# Patient Record
Sex: Female | Born: 1940 | Race: Black or African American | Hispanic: No | Smoking: Never smoker
Health system: Southern US, Community
[De-identification: ages and names within clinical notes are randomized; demographics above are authoritative.]

## PROBLEM LIST (undated history)

## (undated) ENCOUNTER — Emergency Department (HOSPITAL_COMMUNITY): Admission: EM | Discharge status: Against Medical Advice | Payer: Medicare Other

## (undated) DIAGNOSIS — N6009 Solitary cyst of unspecified breast: Secondary | ICD-10-CM

## (undated) DIAGNOSIS — Z972 Presence of dental prosthetic device (complete) (partial): Secondary | ICD-10-CM

## (undated) DIAGNOSIS — I2699 Other pulmonary embolism without acute cor pulmonale: Secondary | ICD-10-CM

## (undated) DIAGNOSIS — I272 Pulmonary hypertension, unspecified: Secondary | ICD-10-CM

## (undated) DIAGNOSIS — D472 Monoclonal gammopathy: Secondary | ICD-10-CM

## (undated) DIAGNOSIS — M1990 Unspecified osteoarthritis, unspecified site: Secondary | ICD-10-CM

## (undated) DIAGNOSIS — E039 Hypothyroidism, unspecified: Secondary | ICD-10-CM

## (undated) DIAGNOSIS — R0600 Dyspnea, unspecified: Secondary | ICD-10-CM

## (undated) DIAGNOSIS — N281 Cyst of kidney, acquired: Secondary | ICD-10-CM

## (undated) DIAGNOSIS — F0390 Unspecified dementia without behavioral disturbance: Secondary | ICD-10-CM

## (undated) DIAGNOSIS — D72819 Decreased white blood cell count, unspecified: Secondary | ICD-10-CM

## (undated) HISTORY — DX: Monoclonal gammopathy: D47.2

## (undated) HISTORY — DX: Other pulmonary embolism without acute cor pulmonale: I26.99

## (undated) HISTORY — DX: Unspecified osteoarthritis, unspecified site: M19.90

## (undated) HISTORY — DX: Presence of dental prosthetic device (complete) (partial): Z97.2

## (undated) HISTORY — DX: Dyspnea, unspecified: R06.00

## (undated) HISTORY — DX: Solitary cyst of unspecified breast: N60.09

## (undated) HISTORY — PX: TUBAL LIGATION: SHX77

## (undated) HISTORY — DX: Cyst of kidney, acquired: N28.1

## (undated) HISTORY — PX: BREAST SURGERY: SHX581

## (undated) HISTORY — DX: Pulmonary hypertension, unspecified: I27.20

## (undated) HISTORY — DX: Decreased white blood cell count, unspecified: D72.819

## (undated) HISTORY — DX: Hypothyroidism, unspecified: E03.9

---

## 1968-08-31 HISTORY — PX: TUBAL LIGATION: SHX77

## 1998-04-22 ENCOUNTER — Ambulatory Visit (HOSPITAL_COMMUNITY): Admission: RE | Admit: 1998-04-22 | Discharge: 1998-04-22 | Payer: Self-pay | Admitting: Gastroenterology

## 1998-07-04 ENCOUNTER — Ambulatory Visit (HOSPITAL_COMMUNITY): Admission: RE | Admit: 1998-07-04 | Discharge: 1998-07-04 | Payer: Self-pay | Admitting: Family Medicine

## 1998-07-04 ENCOUNTER — Encounter: Payer: Self-pay | Admitting: Family Medicine

## 1999-04-08 ENCOUNTER — Emergency Department (HOSPITAL_COMMUNITY): Admission: EM | Admit: 1999-04-08 | Discharge: 1999-04-08 | Payer: Self-pay

## 1999-09-04 ENCOUNTER — Ambulatory Visit (HOSPITAL_COMMUNITY): Admission: RE | Admit: 1999-09-04 | Discharge: 1999-09-04 | Payer: Self-pay | Admitting: Family Medicine

## 1999-09-04 ENCOUNTER — Encounter: Payer: Self-pay | Admitting: Family Medicine

## 1999-12-11 ENCOUNTER — Inpatient Hospital Stay (HOSPITAL_COMMUNITY): Admission: AD | Admit: 1999-12-11 | Discharge: 1999-12-11 | Payer: Self-pay | Admitting: Obstetrics

## 2000-12-28 ENCOUNTER — Ambulatory Visit (HOSPITAL_COMMUNITY): Admission: RE | Admit: 2000-12-28 | Discharge: 2000-12-28 | Payer: Self-pay | Admitting: Family Medicine

## 2000-12-28 ENCOUNTER — Encounter: Payer: Self-pay | Admitting: Family Medicine

## 2000-12-31 ENCOUNTER — Encounter: Payer: Self-pay | Admitting: Family Medicine

## 2000-12-31 ENCOUNTER — Encounter: Admission: RE | Admit: 2000-12-31 | Discharge: 2000-12-31 | Payer: Self-pay | Admitting: Family Medicine

## 2001-07-05 ENCOUNTER — Encounter: Admission: RE | Admit: 2001-07-05 | Discharge: 2001-07-05 | Payer: Self-pay | Admitting: Family Medicine

## 2001-07-05 ENCOUNTER — Encounter: Payer: Self-pay | Admitting: Family Medicine

## 2002-01-17 ENCOUNTER — Encounter: Payer: Self-pay | Admitting: Family Medicine

## 2002-01-17 ENCOUNTER — Encounter: Admission: RE | Admit: 2002-01-17 | Discharge: 2002-01-17 | Payer: Self-pay | Admitting: Family Medicine

## 2002-11-21 ENCOUNTER — Other Ambulatory Visit: Admission: RE | Admit: 2002-11-21 | Discharge: 2002-11-21 | Payer: Self-pay | Admitting: Family Medicine

## 2003-01-23 ENCOUNTER — Encounter: Payer: Self-pay | Admitting: Family Medicine

## 2003-01-23 ENCOUNTER — Encounter: Admission: RE | Admit: 2003-01-23 | Discharge: 2003-01-23 | Payer: Self-pay | Admitting: Family Medicine

## 2003-03-29 ENCOUNTER — Encounter (HOSPITAL_COMMUNITY): Payer: Self-pay | Admitting: Oncology

## 2003-03-29 ENCOUNTER — Ambulatory Visit (HOSPITAL_COMMUNITY): Admission: RE | Admit: 2003-03-29 | Discharge: 2003-03-29 | Payer: Self-pay | Admitting: Oncology

## 2003-04-18 ENCOUNTER — Encounter (INDEPENDENT_AMBULATORY_CARE_PROVIDER_SITE_OTHER): Payer: Self-pay | Admitting: Specialist

## 2003-04-18 ENCOUNTER — Ambulatory Visit (HOSPITAL_COMMUNITY): Admission: RE | Admit: 2003-04-18 | Discharge: 2003-04-18 | Payer: Self-pay | Admitting: Gastroenterology

## 2003-12-18 ENCOUNTER — Other Ambulatory Visit: Admission: RE | Admit: 2003-12-18 | Discharge: 2003-12-18 | Payer: Self-pay | Admitting: Family Medicine

## 2004-02-04 ENCOUNTER — Encounter: Admission: RE | Admit: 2004-02-04 | Discharge: 2004-02-04 | Payer: Self-pay | Admitting: Family Medicine

## 2004-02-06 ENCOUNTER — Encounter: Admission: RE | Admit: 2004-02-06 | Discharge: 2004-02-06 | Payer: Self-pay | Admitting: Family Medicine

## 2004-07-04 ENCOUNTER — Ambulatory Visit: Payer: Self-pay | Admitting: Oncology

## 2004-10-06 ENCOUNTER — Ambulatory Visit: Payer: Self-pay | Admitting: Oncology

## 2005-01-08 ENCOUNTER — Other Ambulatory Visit: Admission: RE | Admit: 2005-01-08 | Discharge: 2005-01-08 | Payer: Self-pay | Admitting: Family Medicine

## 2005-01-13 ENCOUNTER — Encounter: Admission: RE | Admit: 2005-01-13 | Discharge: 2005-04-13 | Payer: Self-pay | Admitting: Family Medicine

## 2005-02-19 ENCOUNTER — Encounter: Admission: RE | Admit: 2005-02-19 | Discharge: 2005-02-19 | Payer: Self-pay | Admitting: Family Medicine

## 2005-04-16 ENCOUNTER — Ambulatory Visit: Payer: Self-pay | Admitting: Oncology

## 2005-04-27 ENCOUNTER — Ambulatory Visit (HOSPITAL_COMMUNITY): Admission: RE | Admit: 2005-04-27 | Discharge: 2005-04-27 | Payer: Self-pay | Admitting: Oncology

## 2005-10-15 ENCOUNTER — Ambulatory Visit: Payer: Self-pay | Admitting: Oncology

## 2006-01-21 ENCOUNTER — Other Ambulatory Visit: Admission: RE | Admit: 2006-01-21 | Discharge: 2006-01-21 | Payer: Self-pay | Admitting: Family Medicine

## 2006-02-09 ENCOUNTER — Encounter: Admission: RE | Admit: 2006-02-09 | Discharge: 2006-02-09 | Payer: Self-pay

## 2006-03-16 ENCOUNTER — Encounter: Admission: RE | Admit: 2006-03-16 | Discharge: 2006-03-16 | Payer: Self-pay | Admitting: Family Medicine

## 2006-04-01 ENCOUNTER — Encounter: Admission: RE | Admit: 2006-04-01 | Discharge: 2006-04-01 | Payer: Self-pay | Admitting: Family Medicine

## 2006-04-14 ENCOUNTER — Ambulatory Visit: Payer: Self-pay | Admitting: Oncology

## 2006-04-16 LAB — COMPREHENSIVE METABOLIC PANEL
ALT: 12 U/L (ref 0–40)
AST: 13 U/L (ref 0–37)
Alkaline Phosphatase: 35 U/L — ABNORMAL LOW (ref 39–117)
CO2: 27 mEq/L (ref 19–32)
Creatinine, Ser: 0.97 mg/dL (ref 0.40–1.20)
Sodium: 138 mEq/L (ref 135–145)
Total Bilirubin: 0.6 mg/dL (ref 0.3–1.2)
Total Protein: 7.5 g/dL (ref 6.0–8.3)

## 2006-04-16 LAB — CBC WITH DIFFERENTIAL/PLATELET
BASO%: 0.4 % (ref 0.0–2.0)
EOS%: 3.9 % (ref 0.0–7.0)
HCT: 38.2 % (ref 34.8–46.6)
LYMPH%: 41.9 % (ref 14.0–48.0)
MCH: 30.8 pg (ref 26.0–34.0)
MCHC: 33.6 g/dL (ref 32.0–36.0)
MCV: 91.7 fL (ref 81.0–101.0)
MONO%: 8.4 % (ref 0.0–13.0)
NEUT%: 45.4 % (ref 39.6–76.8)
Platelets: 283 10*3/uL (ref 145–400)
RBC: 4.16 10*6/uL (ref 3.70–5.32)
WBC: 3.3 10*3/uL — ABNORMAL LOW (ref 3.9–10.0)

## 2006-04-16 LAB — LACTATE DEHYDROGENASE: LDH: 181 U/L (ref 94–250)

## 2006-04-16 LAB — IGG, IGA, IGM: IgM, Serum: 44 mg/dL — ABNORMAL LOW (ref 60–263)

## 2006-06-30 ENCOUNTER — Emergency Department (HOSPITAL_COMMUNITY): Admission: EM | Admit: 2006-06-30 | Discharge: 2006-06-30 | Payer: Self-pay | Admitting: Family Medicine

## 2006-09-30 ENCOUNTER — Emergency Department (HOSPITAL_COMMUNITY): Admission: EM | Admit: 2006-09-30 | Discharge: 2006-09-30 | Payer: Self-pay | Admitting: Emergency Medicine

## 2006-09-30 ENCOUNTER — Ambulatory Visit: Payer: Self-pay | Admitting: Psychiatry

## 2006-09-30 ENCOUNTER — Inpatient Hospital Stay (HOSPITAL_COMMUNITY): Admission: RE | Admit: 2006-09-30 | Discharge: 2006-10-12 | Payer: Self-pay | Admitting: Psychiatry

## 2006-10-12 ENCOUNTER — Ambulatory Visit: Payer: Self-pay | Admitting: Oncology

## 2007-03-21 ENCOUNTER — Encounter: Admission: RE | Admit: 2007-03-21 | Discharge: 2007-03-21 | Payer: Self-pay | Admitting: Family Medicine

## 2007-07-14 ENCOUNTER — Ambulatory Visit: Payer: Self-pay | Admitting: Oncology

## 2007-07-18 LAB — CBC WITH DIFFERENTIAL/PLATELET
BASO%: 0.4 % (ref 0.0–2.0)
Eosinophils Absolute: 0.1 10*3/uL (ref 0.0–0.5)
HCT: 37.4 % (ref 34.8–46.6)
MCHC: 34.5 g/dL (ref 32.0–36.0)
MONO#: 0.1 10*3/uL (ref 0.1–0.9)
NEUT#: 4.2 10*3/uL (ref 1.5–6.5)
NEUT%: 73.2 % (ref 39.6–76.8)
Platelets: 316 10*3/uL (ref 145–400)
WBC: 5.7 10*3/uL (ref 3.9–10.0)
lymph#: 1.3 10*3/uL (ref 0.9–3.3)

## 2007-07-18 LAB — COMPREHENSIVE METABOLIC PANEL
ALT: 12 U/L (ref 0–35)
AST: 14 U/L (ref 0–37)
Albumin: 3.9 g/dL (ref 3.5–5.2)
Alkaline Phosphatase: 44 U/L (ref 39–117)
Calcium: 9.7 mg/dL (ref 8.4–10.5)
Chloride: 104 mEq/L (ref 96–112)
Potassium: 4.2 mEq/L (ref 3.5–5.3)
Sodium: 138 mEq/L (ref 135–145)

## 2008-01-12 ENCOUNTER — Ambulatory Visit: Payer: Self-pay | Admitting: Oncology

## 2008-02-14 ENCOUNTER — Other Ambulatory Visit: Admission: RE | Admit: 2008-02-14 | Discharge: 2008-02-14 | Payer: Self-pay | Admitting: Family Medicine

## 2008-03-23 ENCOUNTER — Encounter: Admission: RE | Admit: 2008-03-23 | Discharge: 2008-03-23 | Payer: Self-pay | Admitting: Family Medicine

## 2008-07-17 ENCOUNTER — Ambulatory Visit: Payer: Self-pay | Admitting: Oncology

## 2008-07-19 LAB — CBC WITH DIFFERENTIAL/PLATELET
Basophils Absolute: 0 10*3/uL (ref 0.0–0.1)
Eosinophils Absolute: 0.1 10*3/uL (ref 0.0–0.5)
HGB: 12.6 g/dL (ref 11.6–15.9)
MCV: 93.6 fL (ref 81.0–101.0)
MONO%: 6.4 % (ref 0.0–13.0)
NEUT#: 4 10*3/uL (ref 1.5–6.5)
Platelets: 272 10*3/uL (ref 145–400)
RDW: 14.1 % (ref 11.3–14.5)

## 2008-07-20 LAB — COMPREHENSIVE METABOLIC PANEL
Albumin: 3.9 g/dL (ref 3.5–5.2)
Alkaline Phosphatase: 43 U/L (ref 39–117)
BUN: 10 mg/dL (ref 6–23)
CO2: 26 mEq/L (ref 19–32)
Calcium: 10 mg/dL (ref 8.4–10.5)
Glucose, Bld: 116 mg/dL — ABNORMAL HIGH (ref 70–99)
Potassium: 3.9 mEq/L (ref 3.5–5.3)

## 2008-07-20 LAB — IGG, IGA, IGM
IgA: 87 mg/dL (ref 68–378)
IgG (Immunoglobin G), Serum: 2670 mg/dL — ABNORMAL HIGH (ref 694–1618)

## 2008-07-20 LAB — LACTATE DEHYDROGENASE: LDH: 184 U/L (ref 94–250)

## 2009-01-15 ENCOUNTER — Ambulatory Visit: Payer: Self-pay | Admitting: Oncology

## 2009-01-17 LAB — COMPREHENSIVE METABOLIC PANEL
ALT: 10 U/L (ref 0–35)
AST: 12 U/L (ref 0–37)
Albumin: 4.1 g/dL (ref 3.5–5.2)
BUN: 13 mg/dL (ref 6–23)
Calcium: 9.9 mg/dL (ref 8.4–10.5)
Chloride: 106 mEq/L (ref 96–112)
Potassium: 4.1 mEq/L (ref 3.5–5.3)
Sodium: 140 mEq/L (ref 135–145)
Total Protein: 7.9 g/dL (ref 6.0–8.3)

## 2009-01-17 LAB — CBC WITH DIFFERENTIAL/PLATELET
Basophils Absolute: 0 10*3/uL (ref 0.0–0.1)
EOS%: 2.2 % (ref 0.0–7.0)
Eosinophils Absolute: 0.1 10*3/uL (ref 0.0–0.5)
HGB: 12.7 g/dL (ref 11.6–15.9)
MCH: 31.1 pg (ref 25.1–34.0)
NEUT#: 2.1 10*3/uL (ref 1.5–6.5)
RDW: 13.6 % (ref 11.2–14.5)
lymph#: 1.7 10*3/uL (ref 0.9–3.3)

## 2009-01-17 LAB — IGG, IGA, IGM: IgA: 82 mg/dL (ref 68–378)

## 2009-02-20 ENCOUNTER — Other Ambulatory Visit: Admission: RE | Admit: 2009-02-20 | Discharge: 2009-02-20 | Payer: Self-pay | Admitting: Family Medicine

## 2009-03-27 ENCOUNTER — Encounter: Admission: RE | Admit: 2009-03-27 | Discharge: 2009-03-27 | Payer: Self-pay | Admitting: Family Medicine

## 2009-07-11 ENCOUNTER — Ambulatory Visit: Payer: Self-pay | Admitting: Oncology

## 2009-07-15 LAB — COMPREHENSIVE METABOLIC PANEL
AST: 13 U/L (ref 0–37)
Alkaline Phosphatase: 35 U/L — ABNORMAL LOW (ref 39–117)
BUN: 16 mg/dL (ref 6–23)
Creatinine, Ser: 1.27 mg/dL — ABNORMAL HIGH (ref 0.40–1.20)

## 2009-07-15 LAB — CBC WITH DIFFERENTIAL/PLATELET
Eosinophils Absolute: 0.1 10*3/uL (ref 0.0–0.5)
LYMPH%: 36.4 % (ref 14.0–49.7)
MCHC: 33.6 g/dL (ref 31.5–36.0)
MCV: 94.9 fL (ref 79.5–101.0)
MONO%: 5.1 % (ref 0.0–14.0)
NEUT#: 2.4 10*3/uL (ref 1.5–6.5)
Platelets: 272 10*3/uL (ref 145–400)
RBC: 4.19 10*6/uL (ref 3.70–5.45)

## 2009-07-15 LAB — IGG, IGA, IGM
IgA: 88 mg/dL (ref 68–378)
IgM, Serum: 54 mg/dL — ABNORMAL LOW (ref 60–263)

## 2010-01-10 ENCOUNTER — Ambulatory Visit: Payer: Self-pay | Admitting: Oncology

## 2010-01-13 LAB — COMPREHENSIVE METABOLIC PANEL
AST: 12 U/L (ref 0–37)
Albumin: 4.1 g/dL (ref 3.5–5.2)
Alkaline Phosphatase: 33 U/L — ABNORMAL LOW (ref 39–117)
BUN: 15 mg/dL (ref 6–23)
Creatinine, Ser: 1.26 mg/dL — ABNORMAL HIGH (ref 0.40–1.20)
Potassium: 4.3 mEq/L (ref 3.5–5.3)
Sodium: 136 mEq/L (ref 135–145)
Total Protein: 8.2 g/dL (ref 6.0–8.3)

## 2010-01-13 LAB — CBC WITH DIFFERENTIAL/PLATELET
BASO%: 0.7 % (ref 0.0–2.0)
EOS%: 2.9 % (ref 0.0–7.0)
HCT: 38.2 % (ref 34.8–46.6)
LYMPH%: 33.6 % (ref 14.0–49.7)
MCH: 32 pg (ref 25.1–34.0)
MONO#: 0.3 10*3/uL (ref 0.1–0.9)
NEUT%: 57.1 % (ref 38.4–76.8)
Platelets: 259 10*3/uL (ref 145–400)
RDW: 13.8 % (ref 11.2–14.5)

## 2010-01-13 LAB — IGG, IGA, IGM
IgG (Immunoglobin G), Serum: 2940 mg/dL — ABNORMAL HIGH (ref 694–1618)
IgM, Serum: 55 mg/dL — ABNORMAL LOW (ref 60–263)

## 2010-01-17 ENCOUNTER — Ambulatory Visit (HOSPITAL_COMMUNITY): Admission: RE | Admit: 2010-01-17 | Discharge: 2010-01-17 | Payer: Self-pay | Admitting: Oncology

## 2010-04-02 ENCOUNTER — Encounter: Admission: RE | Admit: 2010-04-02 | Discharge: 2010-04-02 | Payer: Self-pay | Admitting: Family Medicine

## 2010-04-08 ENCOUNTER — Encounter: Admission: RE | Admit: 2010-04-08 | Discharge: 2010-04-08 | Payer: Self-pay | Admitting: Family Medicine

## 2010-04-14 ENCOUNTER — Ambulatory Visit: Payer: Self-pay | Admitting: Oncology

## 2010-04-15 ENCOUNTER — Encounter: Admission: RE | Admit: 2010-04-15 | Discharge: 2010-04-15 | Payer: Self-pay | Admitting: Family Medicine

## 2010-04-16 ENCOUNTER — Other Ambulatory Visit (HOSPITAL_COMMUNITY): Payer: Self-pay | Admitting: Oncology

## 2010-04-16 ENCOUNTER — Ambulatory Visit (HOSPITAL_COMMUNITY): Admission: RE | Admit: 2010-04-16 | Discharge: 2010-04-16 | Payer: Self-pay | Admitting: Oncology

## 2010-04-17 LAB — IGG, IGA, IGM
IgG (Immunoglobin G), Serum: 2790 mg/dL — ABNORMAL HIGH (ref 694–1618)
IgM, Serum: 49 mg/dL — ABNORMAL LOW (ref 60–263)

## 2010-06-13 ENCOUNTER — Encounter: Admission: RE | Admit: 2010-06-13 | Discharge: 2010-06-13 | Payer: Self-pay | Admitting: Family Medicine

## 2010-06-17 ENCOUNTER — Encounter: Payer: Self-pay | Admitting: Internal Medicine

## 2010-06-24 ENCOUNTER — Encounter: Admission: RE | Admit: 2010-06-24 | Discharge: 2010-06-24 | Payer: Self-pay | Admitting: Interventional Cardiology

## 2010-06-24 ENCOUNTER — Inpatient Hospital Stay (HOSPITAL_COMMUNITY)
Admission: EM | Admit: 2010-06-24 | Discharge: 2010-06-26 | Payer: Self-pay | Source: Home / Self Care | Admitting: Emergency Medicine

## 2010-08-01 ENCOUNTER — Ambulatory Visit: Payer: Self-pay | Admitting: Oncology

## 2010-08-06 ENCOUNTER — Ambulatory Visit: Admit: 2010-08-06 | Payer: Self-pay | Admitting: Oncology

## 2010-08-15 ENCOUNTER — Ambulatory Visit (HOSPITAL_COMMUNITY)
Admission: RE | Admit: 2010-08-15 | Discharge: 2010-08-15 | Payer: Self-pay | Source: Home / Self Care | Attending: Oncology | Admitting: Oncology

## 2010-08-18 ENCOUNTER — Encounter
Admission: RE | Admit: 2010-08-18 | Discharge: 2010-08-18 | Payer: Self-pay | Source: Home / Self Care | Attending: General Surgery | Admitting: General Surgery

## 2010-08-19 ENCOUNTER — Ambulatory Visit (HOSPITAL_COMMUNITY)
Admission: RE | Admit: 2010-08-19 | Discharge: 2010-08-19 | Payer: Self-pay | Source: Home / Self Care | Attending: Oncology | Admitting: Oncology

## 2010-08-19 ENCOUNTER — Encounter (HOSPITAL_COMMUNITY): Payer: Self-pay | Admitting: Oncology

## 2010-09-21 ENCOUNTER — Encounter: Payer: Self-pay | Admitting: Family Medicine

## 2010-09-22 ENCOUNTER — Encounter: Payer: Self-pay | Admitting: Orthopedic Surgery

## 2010-10-01 ENCOUNTER — Other Ambulatory Visit (HOSPITAL_COMMUNITY)
Admission: RE | Admit: 2010-10-01 | Discharge: 2010-10-01 | Disposition: A | Discharge status: Home / Self Care | Payer: Medicare Other | Source: Ambulatory Visit | Attending: Family Medicine | Admitting: Family Medicine

## 2010-10-01 ENCOUNTER — Other Ambulatory Visit: Payer: Self-pay | Admitting: Family Medicine

## 2010-10-01 DIAGNOSIS — N949 Unspecified condition associated with female genital organs and menstrual cycle: Secondary | ICD-10-CM | POA: Insufficient documentation

## 2010-11-03 ENCOUNTER — Other Ambulatory Visit (HOSPITAL_COMMUNITY): Payer: Self-pay | Admitting: Oncology

## 2010-11-03 ENCOUNTER — Encounter (HOSPITAL_BASED_OUTPATIENT_CLINIC_OR_DEPARTMENT_OTHER): Payer: Medicare Other | Admitting: Oncology

## 2010-11-03 DIAGNOSIS — E039 Hypothyroidism, unspecified: Secondary | ICD-10-CM | POA: Insufficient documentation

## 2010-11-03 DIAGNOSIS — J90 Pleural effusion, not elsewhere classified: Secondary | ICD-10-CM | POA: Insufficient documentation

## 2010-11-03 DIAGNOSIS — M1990 Unspecified osteoarthritis, unspecified site: Secondary | ICD-10-CM | POA: Insufficient documentation

## 2010-11-03 DIAGNOSIS — D472 Monoclonal gammopathy: Secondary | ICD-10-CM

## 2010-11-03 DIAGNOSIS — D72819 Decreased white blood cell count, unspecified: Secondary | ICD-10-CM

## 2010-11-03 DIAGNOSIS — N6009 Solitary cyst of unspecified breast: Secondary | ICD-10-CM | POA: Insufficient documentation

## 2010-11-03 DIAGNOSIS — I2699 Other pulmonary embolism without acute cor pulmonale: Secondary | ICD-10-CM

## 2010-11-03 DIAGNOSIS — N281 Cyst of kidney, acquired: Secondary | ICD-10-CM | POA: Insufficient documentation

## 2010-11-03 DIAGNOSIS — F259 Schizoaffective disorder, unspecified: Secondary | ICD-10-CM | POA: Insufficient documentation

## 2010-11-03 LAB — COMPREHENSIVE METABOLIC PANEL
AST: 15 U/L (ref 0–37)
Alkaline Phosphatase: 36 U/L — ABNORMAL LOW (ref 39–117)
BUN: 13 mg/dL (ref 6–23)
CO2: 23 mEq/L (ref 19–32)
Calcium: 9.5 mg/dL (ref 8.4–10.5)
Chloride: 106 mEq/L (ref 96–112)
Creatinine, Ser: 1.32 mg/dL — ABNORMAL HIGH (ref 0.40–1.20)
Potassium: 4.1 mEq/L (ref 3.5–5.3)
Sodium: 137 mEq/L (ref 135–145)
Total Bilirubin: 0.5 mg/dL (ref 0.3–1.2)

## 2010-11-03 LAB — CBC WITH DIFFERENTIAL/PLATELET
Basophils Absolute: 0 10*3/uL (ref 0.0–0.1)
HCT: 39.7 % (ref 34.8–46.6)
MCHC: 32.7 g/dL (ref 31.5–36.0)
MCV: 93.6 fL (ref 79.5–101.0)
NEUT%: 55.3 % (ref 38.4–76.8)
Platelets: 263 10*3/uL (ref 145–400)
RBC: 4.24 10*6/uL (ref 3.70–5.45)
RDW: 13.7 % (ref 11.2–14.5)

## 2010-11-03 LAB — LACTATE DEHYDROGENASE: LDH: 179 U/L (ref 94–250)

## 2010-11-04 ENCOUNTER — Institutional Professional Consult (permissible substitution) (INDEPENDENT_AMBULATORY_CARE_PROVIDER_SITE_OTHER): Payer: Medicare Other | Admitting: Internal Medicine

## 2010-11-04 ENCOUNTER — Encounter: Payer: Self-pay | Admitting: Internal Medicine

## 2010-11-04 DIAGNOSIS — R0602 Shortness of breath: Secondary | ICD-10-CM | POA: Insufficient documentation

## 2010-11-04 DIAGNOSIS — Z86718 Personal history of other venous thrombosis and embolism: Secondary | ICD-10-CM | POA: Insufficient documentation

## 2010-11-05 ENCOUNTER — Other Ambulatory Visit: Payer: Self-pay | Admitting: Internal Medicine

## 2010-11-05 DIAGNOSIS — Z86718 Personal history of other venous thrombosis and embolism: Secondary | ICD-10-CM

## 2010-11-06 ENCOUNTER — Telehealth (INDEPENDENT_AMBULATORY_CARE_PROVIDER_SITE_OTHER): Payer: Self-pay | Admitting: *Deleted

## 2010-11-10 ENCOUNTER — Ambulatory Visit (HOSPITAL_COMMUNITY)
Admission: RE | Admit: 2010-11-10 | Discharge: 2010-11-10 | Disposition: A | Discharge status: Home / Self Care | Payer: Medicare Other | Source: Ambulatory Visit | Attending: Internal Medicine | Admitting: Internal Medicine

## 2010-11-10 ENCOUNTER — Other Ambulatory Visit: Payer: Self-pay | Admitting: Internal Medicine

## 2010-11-10 ENCOUNTER — Encounter (HOSPITAL_COMMUNITY)
Admission: RE | Admit: 2010-11-10 | Discharge: 2010-11-10 | Disposition: A | Discharge status: Home / Self Care | Payer: Medicare Other | Source: Ambulatory Visit | Attending: Internal Medicine | Admitting: Internal Medicine

## 2010-11-10 DIAGNOSIS — R5383 Other fatigue: Secondary | ICD-10-CM | POA: Insufficient documentation

## 2010-11-10 DIAGNOSIS — R5381 Other malaise: Secondary | ICD-10-CM | POA: Insufficient documentation

## 2010-11-10 DIAGNOSIS — R0602 Shortness of breath: Secondary | ICD-10-CM | POA: Insufficient documentation

## 2010-11-10 DIAGNOSIS — Z86718 Personal history of other venous thrombosis and embolism: Secondary | ICD-10-CM

## 2010-11-10 DIAGNOSIS — Z86711 Personal history of pulmonary embolism: Secondary | ICD-10-CM | POA: Insufficient documentation

## 2010-11-10 LAB — HYPERCOAGULABLE PANEL, COMPREHENSIVE
Anticardiolipin IgA: 3 APL U/mL (ref <22)
Anticardiolipin IgM: 3 MPL U/mL (ref <11)
Beta-2-Glycoprotein I IgA: 1 A Units (ref <20)
Beta-2-Glycoprotein I IgM: 3 M Units (ref <20)
DRVVT 1:1 Mix: 40.5 secs (ref 36.2–44.3)
PTT Lupus Anticoagulant: 39.2 secs (ref 30.0–45.6)

## 2010-11-10 MED ORDER — TECHNETIUM TO 99M ALBUMIN AGGREGATED
5.5000 | Freq: Once | INTRAVENOUS | Status: AC | PRN
  Administered 2010-11-10: 5.5 via INTRAVENOUS

## 2010-11-10 MED ORDER — XENON XE 133 GAS
10.2000 | GAS_FOR_INHALATION | Freq: Once | RESPIRATORY_TRACT | Status: AC | PRN
  Administered 2010-11-10: 10.2 via RESPIRATORY_TRACT

## 2010-11-11 NOTE — Assessment & Plan Note (Signed)
Summary: Pulmonary/new pt eval for sob > desat p one lap > needs echo/vq   Visit Type:  Initial Consult Copy to:  Dr. Juluis Rainier Primary Provider/Referring Provider:  Dr. Juluis Rainier  CC:  Dsypnea.  History of Present Illness: 13 yobf never smoker dx'd with PE by CTangiogram 06/24/2010 and referred by Dr Zachery Dauer to pulmonary clinic for eval of persistent doe since then.  November 04, 2010  1st pulmonary office eval cc doe x 8 month no better since dx and rx of PE.  doe x 50 ft  no assoc cough, mild leg swelling.   Pt denies any significant sore throat, dysphagia, itching, sneezing,  nasal congestion or excess secretions,  fever, chills, sweats, unintended wt loss, pleuritic or exertional cp, hempoptysis, variabilty  in activity tolerance  orthopnea pnd .   Pt also denies any obvious fluctuation in symptoms with weather or environmental change or other alleviating or aggravating factors.       Current Medications (verified): 1)  Coumadin 7.5 Mg Tabs (Warfarin Sodium) .... As Directed Per Dr Zachery Dauer 2)  Risperdal 2 Mg Tabs (Risperidone) .Marland Kitchen.. 1 Once Daily 3)  Lithium Carbonate 300 Mg Caps (Lithium Carbonate) .Marland Kitchen.. 1capsule 2 To 3 Times Per Day 4)  Synthroid 112 Mcg Tabs (Levothyroxine Sodium) .Marland Kitchen.. 1 Once Daily 5)  Fish Oil 1000 Mg Caps (Omega-3 Fatty Acids) .Marland Kitchen.. 1 Once Daily 6)  Calcium 600-200 Mg-Unit Tabs (Calcium-Vitamin D) .Marland Kitchen.. 1 Two Times A Day 7)  Lorazepam 0.5 Mg Tabs (Lorazepam) .Marland Kitchen.. 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  The patient denies shortness of breath at rest, productive cough, non-productive cough, coughing up blood, chest pain, irregular heartbeats, acid heartburn, indigestion, loss of appetite, weight change, abdominal pain, difficulty swallowing, sore throat, headaches, nasal congestion/difficulty breathing through nose, sneezing, itching, ear ache, anxiety, depression, hand/feet swelling, joint stiffness or pain, rash, change in color of mucus, and fever.    Vital Signs:  Patient profile:   70 year old female Height:      61 inches Weight:      177.25 pounds BMI:     33.61 O2 Sat:      93 % on Room air Temp:     98.2 degrees F oral Pulse rate:   56 / minute BP sitting:   132 / 84  (left arm)  Vitals Entered By: Vernie Murders (November 04, 2010 3:25 PM)  O2 Flow:  Room air  Serial Vital Signs/Assessments:  Comments: 3:58 PM Ambulatory Pulse Oximetry  Resting; HR__53___    02 Sat__95%ra___  Lap1 (185 feet)   HR__90___   02 Sat__88%ra___ Lap2 (185 feet)   HR_____   02 Sat_____    Lap3 (185 feet)   HR_____   02 Sat_____  ___Test Completed without Difficulty _x__Test Stopped due to: pt  c/o feeling tired and o2 sat decreased to 88%ra.    By: Vernie Murders    Physical Exam  Additional Exam:  amb bf who failed to answer a single question asked in a straightforward manner, tending to go off on tangents or answer questions with ambiguous medical terms or diagnoses and seemed somewhat perturbed with unusual affect  when asked the same question more than once for clarification.  wt 177 November 05, 2010  HEENT: nl dentition, turbinates, and orophanx. Nl external ear canals without cough  reflex NECK :  without JVD/Nodes/TM/ nl carotid upstrokes bilaterally LUNGS: no acc muscle use, clear to A and P bilaterally without cough on insp or exp maneuvers CV:  RRR  no s3 or murmur or increase in P2, no edema  ABD:  soft and nontender with nl excursion in the supine position. No bruits or organomegaly, bowel sounds nl MS:  warm without deformities, calf tenderness, cyanosis or clubbing SKIN: warm and dry without lesions   NEURO:  alert, approp, no deficits     CT of Chest  Procedure date:  06/24/2010  Findings:      Bilateral segmental PE with small effusions  Impression & Recommendations:  Problem # 1:  DYSPNEA (ICD-786.05)  Assoc with desaturation in a patient with extensive bilateral PE 5 months ago raises the issue of whether has developed secondary PAH and or a patent FO as does not appear to have ild or airways dz -  though always worry about occult asp of oil based vitamins and this needs to be stopped while completing the w/u.   best studies are V/Q scanning and Echocardiogram to eval for [facility]  Discussed in detail all the  indications, usual  risks and alternatives  relative to the benefits with patient who agrees to proceed with w/u as outlined in instrucitons  Medications Added to Medication List This Visit: 1)  Coumadin 7.5 Mg Tabs (Warfarin sodium) .... As directed per dr Zachery Dauer 2)  Risperdal 2 Mg Tabs (Risperidone) .Marland Kitchen.. 1 once daily 3)  Lithium Carbonate 300 Mg Caps (Lithium carbonate) .Marland Kitchen.. 1capsule 2 to 3 times per day 4)  Synthroid 112 Mcg Tabs (Levothyroxine sodium) .Marland Kitchen.. 1 once daily 5)  Fish Oil 1000 Mg Caps (Omega-3 fatty acids) .Marland Kitchen.. 1 once daily 6)  Calcium 600-200 Mg-unit Tabs (Calcium-vitamin d) .Marland Kitchen.. 1 two times a day 7)  Lorazepam 0.5 Mg Tabs (Lorazepam) .Marland Kitchen.. 1 once daily as needed  Other Orders: Pulse Oximetry, Ambulatory (78295) TLB-BMP (Basic Metabolic Panel-BMET) (80048-METABOL) TLB-CBC Platelet - w/Differential (85025-CBCD) TLB-TSH (Thyroid  Stimulating Hormone) (84443-TSH) TLB-BNP (B-Natriuretic Peptide) (83880-BNPR) Echo Referral (Echo) Misc. Referral (Misc. Ref) New Patient Level V (62130)  Patient Instructions: 1)  Stop fish oil 2)  See Patient Care Coordinator before leaving for Echocardiogram 3)  Please schedule a follow-up appointment in 6 weeks, sooner if needed with PFT's on return 4)  Late Add:  needs v/q also scheduled now.

## 2010-11-11 NOTE — Progress Notes (Signed)
Summary: speak to libby  Phone Note Call from Patient Call back at Home Phone [phone]   Caller: Patient Call For: wert Summary of Call: pt waiting to hear from libby re: echo. she wanted to know what "archer hobbit" is.Marland Kitchen.(i asked her to repeat this 3 x). i advised her that she has an echo scheduled for 3/12 at [facility]- to arrive at 9:45. i asked if she had a question re: echo and she says libby was supposed to call her about thisw because she isn't having this done.  Initial call taken by: Tivis Ringer, CNA,  November 06, 2010 5:18 PM  Follow-up for Phone Call        spoke to pt she is having Dr Zachery Dauer send over copy of her echo she has already had done and she will don the VQ scan on monday 11/10/10 Follow-up by: Oneita Jolly,  November 07, 2010 9:09 AM

## 2010-11-12 LAB — APTT
aPTT: 143 seconds — ABNORMAL HIGH (ref 24–37)
aPTT: 24 seconds (ref 24–37)

## 2010-11-12 LAB — COMPREHENSIVE METABOLIC PANEL
ALT: 14 U/L (ref 0–35)
AST: 20 U/L (ref 0–37)
Albumin: 3.8 g/dL (ref 3.5–5.2)
Alkaline Phosphatase: 31 U/L — ABNORMAL LOW (ref 39–117)
Alkaline Phosphatase: 40 U/L (ref 39–117)
BUN: 14 mg/dL (ref 6–23)
CO2: 25 mEq/L (ref 19–32)
Calcium: 9.5 mg/dL (ref 8.4–10.5)
Chloride: 110 mEq/L (ref 96–112)
GFR calc Af Amer: 56 mL/min — ABNORMAL LOW (ref >60)
GFR calc non Af Amer: 46 mL/min — ABNORMAL LOW (ref >60)
Glucose, Bld: 100 mg/dL — ABNORMAL HIGH (ref 70–99)
Glucose, Bld: 121 mg/dL — ABNORMAL HIGH (ref 70–99)
Potassium: 3.8 mEq/L (ref 3.5–5.1)
Potassium: 4 mEq/L (ref 3.5–5.1)
Sodium: 137 mEq/L (ref 135–145)
Total Bilirubin: 0.8 mg/dL (ref 0.3–1.2)
Total Protein: 7 g/dL (ref 6.0–8.3)

## 2010-11-12 LAB — PROTIME-INR
INR: 1.06 (ref 0.00–1.49)
Prothrombin Time: 14 seconds (ref 11.6–15.2)
Prothrombin Time: 14.9 seconds (ref 11.6–15.2)

## 2010-11-12 LAB — CBC
HCT: 37.1 % (ref 36.0–46.0)
HCT: 40.8 % (ref 36.0–46.0)
Hemoglobin: 12.2 g/dL (ref 12.0–15.0)
MCHC: 32.9 g/dL (ref 30.0–36.0)
MCV: 95.3 fL (ref 78.0–100.0)
RBC: 4.28 MIL/uL (ref 3.87–5.11)
WBC: 4.9 10*3/uL (ref 4.0–10.5)
WBC: 5 10*3/uL (ref 4.0–10.5)

## 2010-11-12 LAB — DIFFERENTIAL
Basophils Absolute: 0 10*3/uL (ref 0.0–0.1)
Basophils Relative: 0 % (ref 0–1)
Basophils Relative: 1 % (ref 0–1)
Eosinophils Absolute: 0.2 10*3/uL (ref 0.0–0.7)
Eosinophils Relative: 4 % (ref 0–5)
Lymphs Abs: 2.1 10*3/uL (ref 0.7–4.0)
Monocytes Absolute: 0.2 10*3/uL (ref 0.1–1.0)
Monocytes Relative: 6 % (ref 3–12)
Neutro Abs: 3 10*3/uL (ref 1.7–7.7)
Neutrophils Relative %: 60 % (ref 43–77)

## 2010-11-12 LAB — PROTEIN S, TOTAL: Protein S Ag, Total: 94 % (ref 70–140)

## 2010-11-12 LAB — ANTITHROMBIN III: AntiThromb III Func: 93 % (ref 75–120)

## 2010-11-18 ENCOUNTER — Other Ambulatory Visit (HOSPITAL_COMMUNITY): Payer: Medicare Other

## 2010-11-18 ENCOUNTER — Other Ambulatory Visit: Payer: Medicare Other

## 2010-11-18 ENCOUNTER — Other Ambulatory Visit (INDEPENDENT_AMBULATORY_CARE_PROVIDER_SITE_OTHER): Payer: Medicare Other | Admitting: Internal Medicine

## 2010-11-18 DIAGNOSIS — R0602 Shortness of breath: Secondary | ICD-10-CM

## 2010-11-18 DIAGNOSIS — E039 Hypothyroidism, unspecified: Secondary | ICD-10-CM

## 2010-12-03 ENCOUNTER — Encounter: Payer: Self-pay | Admitting: Internal Medicine

## 2010-12-19 ENCOUNTER — Ambulatory Visit (INDEPENDENT_AMBULATORY_CARE_PROVIDER_SITE_OTHER): Payer: Medicare Other | Admitting: Internal Medicine

## 2010-12-19 ENCOUNTER — Telehealth: Payer: Self-pay | Admitting: Internal Medicine

## 2010-12-19 ENCOUNTER — Encounter: Payer: Self-pay | Admitting: Internal Medicine

## 2010-12-19 VITALS — BP 116/70 | HR 58 | Temp 97.8°F | Ht 64.0 in | Wt 173.0 lb

## 2010-12-19 DIAGNOSIS — I2789 Other specified pulmonary heart diseases: Secondary | ICD-10-CM

## 2010-12-19 DIAGNOSIS — J90 Pleural effusion, not elsewhere classified: Secondary | ICD-10-CM

## 2010-12-19 DIAGNOSIS — I2724 Chronic thromboembolic pulmonary hypertension: Secondary | ICD-10-CM

## 2010-12-19 DIAGNOSIS — R0602 Shortness of breath: Secondary | ICD-10-CM

## 2010-12-19 LAB — PULMONARY FUNCTION TEST

## 2010-12-19 NOTE — Progress Notes (Signed)
PFT done today. 

## 2010-12-19 NOTE — Patient Instructions (Addendum)
You still appear to have blood blots in your lungs 6 months after you started coumadin so you need to stay on this  If you have signs of right heart strain we will need to refer you for follow up to [facility] but there are no medication changes needed, just stay on therapeutic levels of coumadin to prevent future clots  If you do not have right heart strain we will see you back here in 3 months, sooner if breathing worsens

## 2010-12-19 NOTE — Telephone Encounter (Signed)
See ov note, reviewed pft with pt and echo by phone

## 2010-12-19 NOTE — Telephone Encounter (Signed)
Called and spoke with pt and she stated that she thought MW was going to go over her PFT results at her ov today.  Pt stated that PFT was done prior to ov.  Pt is aware that MW is not in the office this afternoon and it may be Monday before we call her with the results.  MW please advise on PFT results. thanks

## 2010-12-19 NOTE — Progress Notes (Signed)
  Subjective:    Patient ID: Michaela Horton, female    DOB: [birth year], 70 y.o.   MRN: [id number]  HPI  20 yobf never smoker dx'd with PE by CTangiogram 06/24/2010 and referred by Dr Zachery Dauer to pulmonary clinic for eval of persistent doe since then.   November 04, 2010 1st pulmonary office eval cc doe x 8 month no better since dx and rx of PE. doe x 50 ft  no assoc cough, mild leg swelling.  desat walking, rec v/q and repeat echo >  Mod to high prob/ severe PAH  12/19/10 ov/[place]:  Cc no change doe, returns for PFT's (minimal airflow obst with   dlco =51%).  Pt denies any significant sore throat, dysphagia, itching, sneezing,  nasal congestion or excess/ purulent secretions,  fever, chills, sweats, unintended wt loss, pleuritic or exertional cp, hempoptysis, orthopnea pnd or leg swelling.    Also denies any obvious fluctuation of symptoms with weather or environmental changes or other aggravating or alleviating factors.      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 Nl external ear canals without cough reflex  NECK : without JVD/Nodes/TM/ nl  carotid upstrokes bilaterally  LUNGS: no acc muscle use, clear to A and P bilaterally without cough on insp or exp maneuvers  CV: RRR no s3 or murmur or increase in P2, no edema  ABD: soft and nontender with nl excursion in the supine position. No bruits or organomegaly, bowel sounds nl  MS: warm without deformities, calf tenderness, cyanosis or clubbing  SKIN: warm and dry without lesions     Assessment & Plan:

## 2010-12-20 NOTE — Assessment & Plan Note (Signed)
Classic presentation for chronic thromboembolic pah and no treatment available x refer to [facility] to consider thrombectomy which is performed like an endarterectomy and is quite effective in most patients  In meantime need to keep INR on the high side of therapeutic and wear 02 to minimize worsening related to exertional desats.

## 2010-12-23 ENCOUNTER — Encounter: Payer: Self-pay | Admitting: Internal Medicine

## 2010-12-24 ENCOUNTER — Encounter: Payer: Self-pay | Admitting: Internal Medicine

## 2010-12-25 ENCOUNTER — Telehealth: Payer: Self-pay | Admitting: Internal Medicine

## 2010-12-25 NOTE — Telephone Encounter (Signed)
Pt aware she needs to wear O2 while exerting/walking on 2 liters due to desaturation on 3/6 to 88% on room air. Pt just needed clarification and had verbalized understanding of O2 use.

## 2010-12-30 ENCOUNTER — Telehealth: Payer: Self-pay | Admitting: Internal Medicine

## 2010-12-30 NOTE — Telephone Encounter (Signed)
Dr. Sherene Sires please advise Dr. Zachery Dauer would like you to call her. Was given # [phone] and then press 1 for physician's line. [place] is faxing over echo for pt for you to review. This was placed in your look at . Please advise Dr. Sherene Sires. Thanks  Carver Fila, CMA

## 2011-01-07 ENCOUNTER — Encounter (INDEPENDENT_AMBULATORY_CARE_PROVIDER_SITE_OTHER): Payer: Self-pay | Admitting: General Surgery

## 2011-01-08 NOTE — Telephone Encounter (Signed)
Discussed with Dr Zachery Dauer, pt referred to [facility]

## 2011-01-16 NOTE — Op Note (Signed)
NAME:  Michaela Horton, Michaela Horton                           ACCOUNT NO.:  [id number]   MEDICAL RECORD NO.:  [id number]                   PATIENT TYPE:  AMB   LOCATION:  ENDO                                 FACILITY:  [facility]   PHYSICIAN:  Petra Kuba, M.D.                 DATE OF BIRTH:  02-15-1941   DATE OF PROCEDURE:  04/18/2003  DATE OF DISCHARGE:                                 OPERATIVE REPORT   PROCEDURE:  Colonoscopy with polypectomy.   INDICATIONS FOR PROCEDURE:  Patient with history of colon polyps due for  repeat screening.  Consent was signed after risks, benefits, methods, and  options were thoroughly discussed in the past.   MEDICATIONS GIVEN:  Demerol 40 mg, Versed 4 mg   PROCEDURE IN DETAIL:  Rectal inspection is pertinent for external  hemorrhoids small.  Digital exam is negative.  Video pediatric adjustable  colonoscope was inserted and easily advanced around the colon to the cecum.  This did require some abdominal pressure and no position changes.  No  obvious abnormality was seen on insertion.  The cecum was identified by the  appendiceal orifice and ileocecal valve.  The scope was slowly withdrawn.  One fold behind the ileocecal valve in the proximal ascending, a small  sessile polyp was seen and was initially snared, but we cut through the  polyp.  The piece was suctioned through the scope and collected in the trap.  We did go ahead hot biopsy the base.  The scope was further withdrawn and  another tiny ascending polyp was seen and hot biopsied x1.  The scope was  further withdrawn.  Prep was adequate. There was some liquid stool that  required washing and suctioning, but no other polypoid lesions were seen  until we withdrew back to the distal sigmoid where two tiny hyperplastic  appearing polyps were seen, hot biopsied, put in a separate container.  Anorectal pull-through and retroflexion confirmed some small hemorrhoids.  Scope was reinserted a short ways up the left  side of the colon, air was  suctioned and scope removed.  The patient tolerated the procedure well and  there was no obvious immediate complication.   ENDOSCOPIC DIAGNOSES:  1. Internal/external small hemorrhoids.  2. Two tiny distal sigmoid, probably hyperplastic appearing polyps, hot     biopsied.  3. Two ascending polyps, one tiny hot biopsied, the other small sessile     snared and hot biopsied the base.  4. Otherwise within normal limits to the cecum.    PLAN:  1. Await pathology to determine future colonic screening.  2. Happy to see back p.r.n., otherwise return to the care of Dr. Abigail Miyamoto,     with customary health care maintenance to include yearly rectals and     guaiacs.  Petra Kuba, M.D.    MEM/MEDQ  D:  04/18/2003  T:  04/19/2003  Job:  [id number]   cc:   Chales Salmon. Abigail Miyamoto, M.D.  [address]  [place]  [place] [address]  Fax: [phone]

## 2011-01-16 NOTE — Discharge Summary (Signed)
NAMEMarland Kitchen  TYRENE, NADER NO.:  [id number]   MEDICAL RECORD NO.:  [id number]          PATIENT TYPE:  IPS   LOCATION:  0407                          FACILITY:  [facility]   PHYSICIAN:  Anselm Jungling, MD  DATE OF BIRTH:  10/07/1940   DATE OF ADMISSION:  09/30/2006  DATE OF DISCHARGE:  10/12/2006                               DISCHARGE SUMMARY   IDENTIFYING DATA/REASON FOR ADMISSION:  This was an inpatient  psychiatric admission for Michaela Horton, a 70 year old female with a history  of psychotic disorder.  She was admitted due to psychotic  decompensation.  Please refer to the admission note for further details  pertaining to the symptoms, circumstances and history that led to her  hospitalization.   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   MEDICAL/LABORATORY:  The patient was medically and physically assessed  by the psychiatric nurse practitioner.  She was in good health, without  any active medical problems, but was continued on her usual regimen of  Synthroid 112 mcg daily.   HOSPITAL COURSE:  The patient was admitted to the adult inpatient  psychiatric service.  She had come to [name] with a history of treatment  with lithium and was continued on lithium at a dose of 300 mg in the  morning and 600 mg at bedtime.  Low-dose Risperdal was initiated as  well.   The patient presented with fluctuating degree of thought disorder and  delusionality, that continued to fluctuate over the first 10 days of her  inpatient stay.  At times, she expressed persecutory delusions of people  coming to the hospital to get her.  At other times, she talked about  having to appear in court for fraud.   Her Risperdal dose was gradually increased to a final dose of 2 mg  q.h.s.  This appeared to be well-tolerated.   We were in touch with her family members during her inpatient stay who,  based upon their visits with  [name], were able to give [name] a good  estimation of when and how closely she was approximating her baseline  level of better functioning.   On the day of discharge and the day before discharge, the patient  appeared to be vastly improved.  She was nondelusional, showed good  insight, was calm, cooperative, and eating and sleeping were stable.  Her lithium level was 0.91, which was felt to be highly desirable.  She  appeared to be appropriate for discharge on the 13th hospital day.   AFTERCARE:  The patient was to follow up at [facility] with Dr.  Raquel James, with an appointment on October 19, 2006.  She was also  instructed to ask for a therapist when she saw Dr. Raquel James.   DISCHARGE MEDICATIONS:  1. Risperdal 2 mg q.h.s.  2. Lithium carbonate 300 mg q.a.m. and 600 mg q.h.s.  3. Synthroid 112 mcg daily.   DISCHARGE DIAGNOSES:  AXIS I:  Schizoaffective disorder, most recently  hypomanic with psychotic features.  AXIS II:  Deferred.  AXIS III:  History of hypothyroidism.  AXIS IV:  Stressors:  Severe.  AXIS V:  GAF on discharge 65.      Anselm Jungling, MD  Electronically Signed     SPB/MEDQ  D:  10/17/2006  T:  10/17/2006  Job:  [id number]

## 2011-03-03 ENCOUNTER — Encounter (INDEPENDENT_AMBULATORY_CARE_PROVIDER_SITE_OTHER): Payer: Self-pay | Admitting: Surgery

## 2011-03-05 ENCOUNTER — Other Ambulatory Visit (HOSPITAL_COMMUNITY): Payer: Self-pay | Admitting: Oncology

## 2011-03-05 ENCOUNTER — Encounter (HOSPITAL_BASED_OUTPATIENT_CLINIC_OR_DEPARTMENT_OTHER): Payer: Medicare Other | Admitting: Oncology

## 2011-03-05 DIAGNOSIS — D472 Monoclonal gammopathy: Secondary | ICD-10-CM

## 2011-03-05 DIAGNOSIS — Z86718 Personal history of other venous thrombosis and embolism: Secondary | ICD-10-CM

## 2011-03-05 DIAGNOSIS — D72819 Decreased white blood cell count, unspecified: Secondary | ICD-10-CM

## 2011-03-05 LAB — CBC WITH DIFFERENTIAL/PLATELET
Basophils Absolute: 0 10*3/uL (ref 0.0–0.1)
EOS%: 1.8 % (ref 0.0–7.0)
Eosinophils Absolute: 0.1 10*3/uL (ref 0.0–0.5)
HCT: 36.1 % (ref 34.8–46.6)
HGB: 12.1 g/dL (ref 11.6–15.9)
MONO#: 0.4 10*3/uL (ref 0.1–0.9)
NEUT#: 3.3 10*3/uL (ref 1.5–6.5)
RDW: 14.9 % — ABNORMAL HIGH (ref 11.2–14.5)
WBC: 5.3 10*3/uL (ref 3.9–10.3)
lymph#: 1.5 10*3/uL (ref 0.9–3.3)

## 2011-03-06 LAB — COMPREHENSIVE METABOLIC PANEL
Albumin: 3.8 g/dL (ref 3.5–5.2)
CO2: 24 mEq/L (ref 19–32)
Chloride: 108 mEq/L (ref 96–112)
Glucose, Bld: 87 mg/dL (ref 70–99)
Potassium: 4.3 mEq/L (ref 3.5–5.3)
Sodium: 134 mEq/L — ABNORMAL LOW (ref 135–145)
Total Protein: 7.2 g/dL (ref 6.0–8.3)

## 2011-03-06 LAB — LACTATE DEHYDROGENASE: LDH: 185 U/L (ref 94–250)

## 2011-03-06 LAB — IGG, IGA, IGM: IgG (Immunoglobin G), Serum: 2650 mg/dL — ABNORMAL HIGH (ref 690–1700)

## 2011-03-10 ENCOUNTER — Ambulatory Visit (INDEPENDENT_AMBULATORY_CARE_PROVIDER_SITE_OTHER): Payer: Medicare Other | Admitting: Surgery

## 2011-03-10 ENCOUNTER — Encounter (INDEPENDENT_AMBULATORY_CARE_PROVIDER_SITE_OTHER): Payer: Self-pay | Admitting: Surgery

## 2011-03-10 VITALS — Temp 96.2°F | Wt 176.2 lb

## 2011-03-10 DIAGNOSIS — N6019 Diffuse cystic mastopathy of unspecified breast: Secondary | ICD-10-CM

## 2011-03-10 DIAGNOSIS — N6459 Other signs and symptoms in breast: Secondary | ICD-10-CM

## 2011-03-10 NOTE — Progress Notes (Signed)
Subjective:     Patient ID: Michaela Horton, female   DOB: 1940/11/09, 70 y.o.   MRN: [id number]    Temp 96.2 F (35.7 C)  Wt 176 lb 3.2 oz (79.924 kg)    HPI  This is a 70 year old female who is a followup patient of Dr. Joice Lofts Allen's. She is being followed for fibrocystic changes in the right breast at the 2:00 position. She last saw Dr. Freida Busman in December of 2011. She reports no changes since then. She denies nipple discharge. She has no other complaints. Review of Systems     Objective:   Physical Exam    Exam was limited to the breast and axilla. She still has fibrocystic changes in both breasts. It is more pronounced in the upper outer quadrant of the right breast. Nipples are normal. There is no nipple discharge. There is no axillary adenopathy on either side. Assessment:     Fibrocystic breast disease    Plan:       I would recommend her continued self-examinations but she is not doing as well as her yearly mammograms. I have no other recommendations at this time. She can followup with [name] as needed. She can continue to see her primary care physician on a yearly basis regarding her breast.

## 2011-03-16 ENCOUNTER — Other Ambulatory Visit: Payer: Self-pay | Admitting: Family Medicine

## 2011-03-16 ENCOUNTER — Other Ambulatory Visit (INDEPENDENT_AMBULATORY_CARE_PROVIDER_SITE_OTHER): Payer: Self-pay | Admitting: General Surgery

## 2011-03-16 DIAGNOSIS — Z1231 Encounter for screening mammogram for malignant neoplasm of breast: Secondary | ICD-10-CM

## 2011-04-21 ENCOUNTER — Ambulatory Visit
Admission: RE | Admit: 2011-04-21 | Discharge: 2011-04-21 | Disposition: A | Discharge status: Home / Self Care | Payer: Medicare Other | Source: Ambulatory Visit | Attending: Family Medicine | Admitting: Family Medicine

## 2011-04-21 DIAGNOSIS — Z1231 Encounter for screening mammogram for malignant neoplasm of breast: Secondary | ICD-10-CM

## 2011-08-24 ENCOUNTER — Encounter: Payer: Self-pay | Admitting: *Deleted

## 2011-08-24 ENCOUNTER — Telehealth: Payer: Self-pay | Admitting: Internal Medicine

## 2011-08-24 NOTE — Telephone Encounter (Signed)
Per Verlon Au, pt will need OV with Mw to discuss first. Pt scheduled for 12/26 @ 9:45 with MW to discuss oxygen needs.

## 2011-08-24 NOTE — Telephone Encounter (Signed)
Per pt, she needs a letter stating that she needs to carry her oxygen with her on flight. We spoke with Kyung Rudd at [employer] and they will need confirmation number of her flight in order to assist with this. The patient was told to call [employer] at [phone] once the flight has been scheduled and she was given a generic letter that Verlon Au typed up to have on hand regarding why she needs the oxygen. Pt verbalized understanding and will contact the airline.

## 2011-08-26 ENCOUNTER — Ambulatory Visit: Payer: Medicare Other | Admitting: Internal Medicine

## 2011-08-26 ENCOUNTER — Telehealth: Payer: Self-pay | Admitting: Internal Medicine

## 2011-08-26 NOTE — Telephone Encounter (Signed)
Pt here waiting for MW to fill out forms needed today for her flight. I have filled out the forms and MW has signed said forms (Physician's Statement and Physician's Consent Form for Oxygen). Both of the forms have been copied and placed in MW scan folder. Pt given aoriginals to take with her to the airport and give to the airline.

## 2011-08-29 ENCOUNTER — Telehealth: Payer: Self-pay | Admitting: Oncology

## 2011-08-29 NOTE — Telephone Encounter (Signed)
lmonvm for pt re appt for 1/10. Jan schedule mailed today.

## 2011-09-03 ENCOUNTER — Ambulatory Visit: Payer: Medicare Other | Admitting: Internal Medicine

## 2011-09-10 ENCOUNTER — Ambulatory Visit (HOSPITAL_BASED_OUTPATIENT_CLINIC_OR_DEPARTMENT_OTHER): Payer: Medicare Other | Admitting: Physician Assistant

## 2011-09-10 ENCOUNTER — Other Ambulatory Visit (HOSPITAL_BASED_OUTPATIENT_CLINIC_OR_DEPARTMENT_OTHER): Payer: Medicare Other | Admitting: Lab

## 2011-09-10 ENCOUNTER — Other Ambulatory Visit (HOSPITAL_COMMUNITY): Payer: Self-pay | Admitting: Oncology

## 2011-09-10 ENCOUNTER — Telehealth: Payer: Self-pay | Admitting: Oncology

## 2011-09-10 VITALS — BP 128/82 | HR 84 | Temp 98.4°F | Ht 64.0 in | Wt 181.3 lb

## 2011-09-10 DIAGNOSIS — I2699 Other pulmonary embolism without acute cor pulmonale: Secondary | ICD-10-CM

## 2011-09-10 DIAGNOSIS — D472 Monoclonal gammopathy: Secondary | ICD-10-CM

## 2011-09-10 DIAGNOSIS — D72819 Decreased white blood cell count, unspecified: Secondary | ICD-10-CM

## 2011-09-10 DIAGNOSIS — Z86718 Personal history of other venous thrombosis and embolism: Secondary | ICD-10-CM

## 2011-09-10 LAB — CBC WITH DIFFERENTIAL/PLATELET
Basophils Absolute: 0 10*3/uL (ref 0.0–0.1)
EOS%: 1.3 % (ref 0.0–7.0)
Eosinophils Absolute: 0.1 10*3/uL (ref 0.0–0.5)
HCT: 38.7 % (ref 34.8–46.6)
HGB: 12.8 g/dL (ref 11.6–15.9)
MCH: 32 pg (ref 25.1–34.0)
MCV: 96.4 fL (ref 79.5–101.0)
MONO%: 6.5 % (ref 0.0–14.0)
NEUT#: 2.3 10*3/uL (ref 1.5–6.5)
NEUT%: 60.5 % (ref 38.4–76.8)
lymph#: 1.2 10*3/uL (ref 0.9–3.3)

## 2011-09-10 NOTE — Telephone Encounter (Signed)
l/m and mailed appt info for 03/10/12,aom

## 2011-09-10 NOTE — Progress Notes (Signed)
OFFICE PROGRESS NOTE  CC: Dossie Der, MD Jamas Lav, M.D.   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  We first saw this patient in June 2001.  We last saw this patient on 03/05/2011, following a hospitalization in 2011 for PEi It was Dr. Mamie Levers recommendation that the patient should remain on Coumadin indefinitely. In the interim pt has seen Dr. Sandrea Hughs and also has been to [facility] for pulmonary hypertension.  She is now on a drug, Tracleer, as well as a new med, Ascerda. Pt was being evaluated for surgery, but was felt not to be a candidate for it. The patient is here on oxygen at 2 liters per minute.  She is without complaints.  Feels fine.  She tells me she does not wear her oxygen all the time, but I think she does wear it at night.   Objective: Vital signs in last 24 hours: BP 128/82  Pulse 84  Temp(Src) 98.4 F (36.9 C) (Oral)  Ht 5\' 4"  (1.626 m)  Wt 181 lb 4.8 oz (82.237 kg)  BMI 31.12 kg/m2   Physical Exam:General:  She looks well and is in no acute distress.   HEENT:  There is no scleral icterus.  Mouth and pharynx are benign.  No peripheral adenopathy palpable.  I did not take note of any neck vein distension.  Heart/Lungs:  Seemed normal.    Abdomen:  With the patient sitting, is somewhat obese, nontender with no organomegaly or masses palpable.  Extremities:  1 plus edema billaterally   Lab Results: Labs:  CBC   Lab 09/10/11 0956  WBC 3.9  HGB 12.8  HCT 38.7  PLT 241  MCV 96.4  MCH 32.0  MCHC 33.2  RDW 13.9  LYMPHSABS 1.2  MONOABS 0.2  EOSABS 0.1  BASOSABS 0.0  BANDABS --    CMP And QIg are pending  No results found for this basename: NA:5,K:5,CL:5,CO2:5,GLUCOSE:5,BUN:5,CREATININE:5,GFRCGP,:5,CALCIUM:5,MG:5,AST:5,ALT:5,ALKPHOS:5,BILITOT:5 in the last 168 hours      Component Value Date/Time   BILITOT 0.3 03/05/2011 0946   BILITOT 0.3  03/05/2011 0946     IMPRESSION AND PLAN:  Michaela Horton appears to have a monoclonal gammopathy that is benign and stable.  She does have hypercoagulable state for reasons that are unclear, is currently on Coumadin .  She is now on oxygen 2 liters per minute with a satisfactory O2 sat. We will plan to see Michaela Horton again in 6 months at which time we will check CBC, chemistries and quantitative immunoglobulins.     [facility] 09/10/2011, 11:43 AM

## 2011-09-11 LAB — IGG, IGA, IGM
IgA: 70 mg/dL (ref 69–380)
IgG (Immunoglobin G), Serum: 2140 mg/dL — ABNORMAL HIGH (ref 690–1700)
IgM, Serum: 32 mg/dL — ABNORMAL LOW (ref 52–322)

## 2011-09-11 LAB — LACTATE DEHYDROGENASE: LDH: 176 U/L (ref 94–250)

## 2011-09-11 LAB — COMPREHENSIVE METABOLIC PANEL
ALT: 16 U/L (ref 0–35)
Albumin: 3.9 g/dL (ref 3.5–5.2)
Alkaline Phosphatase: 30 U/L — ABNORMAL LOW (ref 39–117)
CO2: 26 mEq/L (ref 19–32)
Glucose, Bld: 94 mg/dL (ref 70–99)
Potassium: 4.1 mEq/L (ref 3.5–5.3)
Sodium: 138 mEq/L (ref 135–145)
Total Bilirubin: 0.4 mg/dL (ref 0.3–1.2)
Total Protein: 7.3 g/dL (ref 6.0–8.3)

## 2011-09-24 ENCOUNTER — Ambulatory Visit (INDEPENDENT_AMBULATORY_CARE_PROVIDER_SITE_OTHER): Payer: Medicare Other | Admitting: Internal Medicine

## 2011-09-24 ENCOUNTER — Encounter: Payer: Self-pay | Admitting: Internal Medicine

## 2011-09-24 VITALS — BP 134/80 | HR 77 | Temp 97.9°F | Ht 61.0 in | Wt 179.6 lb

## 2011-09-24 DIAGNOSIS — J9610 Chronic respiratory failure, unspecified whether with hypoxia or hypercapnia: Secondary | ICD-10-CM

## 2011-09-24 DIAGNOSIS — J9611 Chronic respiratory failure with hypoxia: Secondary | ICD-10-CM | POA: Insufficient documentation

## 2011-09-24 DIAGNOSIS — I2789 Other specified pulmonary heart diseases: Secondary | ICD-10-CM

## 2011-09-24 DIAGNOSIS — I2724 Chronic thromboembolic pulmonary hypertension: Secondary | ICD-10-CM

## 2011-09-24 NOTE — Progress Notes (Signed)
Subjective:    Patient ID: Michaela Horton, female    DOB: [birth year]    MRN: [id number]  HPI  Brief patient profile:  68 yobf never smoker dx'd with PE by CTangiogram 06/24/2010 and referred by Dr Zachery Dauer to pulmonary clinic for eval of persistent doe since then.   November 04, 2010 1st pulmonary office eval cc doe x 8 month no better since dx and rx of PE. doe x 50 ft  no assoc cough, mild leg swelling.  desat walking, rec v/q and repeat echo >  Mod to high prob/ severe PAH  12/19/10 ov/Rahaf Carbonell:  Cc no change doe, returns for PFT's (minimal airflow obst with   dlco =51%).   rec You still appear to have blood blots in your lungs 6 months after you started coumadin so you need to stay on this Referred to [facility] clinic > trachleer started       09/24/2011 f/u ov/Jamon Hayhurst cc Unchanged doe- here to recertify for o2 prn with exertion. Sats when arrived 87%ra but then improved to 93 at rest  At rest not typically using any 02 but  automatically at bedtime and   On 02 2 lpm can shop at [employer] s decline in ability and thinking about exercise program. No cough or leg swelling   Sleeping ok without nocturnal  or early am exacerbation  of respiratory  c/o's . Also denies any obvious fluctuation of symptoms with weather or environmental changes or other aggravating or alleviating factors except as outlined above   ROS  At present neg for  any significant sore throat, dysphagia, itching, sneezing,  nasal congestion or excess/ purulent secretions,  fever, chills, sweats, unintended wt loss, pleuritic or exertional cp, hempoptysis, orthopnea pnd or leg swelling.  Also denies presyncope, palpitations, heartburn, abdominal pain, nausea, vomiting, diarrhea  or change in bowel or urinary habits, dysuria,hematuria,  rash, arthralgias, visual complaints, headache, numbness weakness or ataxia.         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 Nl external ear canals without cough reflex  NECK : without JVD/Nodes/TM/ nl carotid upstrokes bilaterally  LUNGS: no acc muscle use, clear to A and P bilaterally without cough on insp or exp maneuvers  CV: RRR no s3 or murmur , definite increase in P2, no edema  ABD: soft and nontender with nl excursion in the supine position. No bruits or organomegaly, bowel sounds nl  MS: warm without deformities, calf tenderness, cyanosis or  clubbing  SKIN: warm and dry without lesions     Assessment & Plan:

## 2011-09-24 NOTE — Patient Instructions (Signed)
02 should be worn automatically at bedtime and for any more activity than walking across a room at home  At 2lpm When really exerting yourself use 4lpm

## 2011-09-24 NOTE — Assessment & Plan Note (Addendum)
Adequate control on present rx, reviewed 02 needs for 2lpm w/in the house and 4lpm for more than this while addressing the PAH at [facility] as nothing further to offer here

## 2011-09-24 NOTE — Assessment & Plan Note (Signed)
>   referred to [facility] 12/19/2010  And started on 02 24 h/day    PULMONARY EMBOLISM, HX OF (ICD-V12.51)  - Dx 05/2010  - Venous dopplers neg 08/19/2010  - Echocardiogram 11/28/10:  Severely elevated RV sys pressure with dilated R Ht est at 99 with nl LV -VQ  11/05/10  Mod to high prob PE

## 2011-11-06 ENCOUNTER — Other Ambulatory Visit: Payer: Self-pay

## 2011-11-06 ENCOUNTER — Telehealth: Payer: Self-pay | Admitting: Oncology

## 2011-11-06 NOTE — Telephone Encounter (Signed)
S/w pt re appt for 4/1 @ 3:30 pm.

## 2011-11-10 ENCOUNTER — Other Ambulatory Visit: Payer: Self-pay | Admitting: Medical Oncology

## 2011-11-10 ENCOUNTER — Other Ambulatory Visit: Payer: Self-pay | Admitting: Oncology

## 2011-11-10 DIAGNOSIS — D472 Monoclonal gammopathy: Secondary | ICD-10-CM

## 2011-11-10 DIAGNOSIS — I2724 Chronic thromboembolic pulmonary hypertension: Secondary | ICD-10-CM

## 2011-11-30 ENCOUNTER — Encounter: Payer: Self-pay | Admitting: Oncology

## 2011-11-30 ENCOUNTER — Encounter: Payer: Self-pay | Admitting: Medical Oncology

## 2011-11-30 ENCOUNTER — Other Ambulatory Visit (HOSPITAL_BASED_OUTPATIENT_CLINIC_OR_DEPARTMENT_OTHER): Payer: Medicare Other | Admitting: Lab

## 2011-11-30 ENCOUNTER — Other Ambulatory Visit: Payer: Self-pay | Admitting: Medical Oncology

## 2011-11-30 ENCOUNTER — Ambulatory Visit (HOSPITAL_BASED_OUTPATIENT_CLINIC_OR_DEPARTMENT_OTHER): Payer: Medicare Other | Admitting: Oncology

## 2011-11-30 VITALS — BP 115/79 | HR 67 | Temp 97.9°F | Ht 61.0 in | Wt 174.0 lb

## 2011-11-30 DIAGNOSIS — I2724 Chronic thromboembolic pulmonary hypertension: Secondary | ICD-10-CM

## 2011-11-30 DIAGNOSIS — Z800 Family history of malignant neoplasm of digestive organs: Secondary | ICD-10-CM

## 2011-11-30 DIAGNOSIS — I2699 Other pulmonary embolism without acute cor pulmonale: Secondary | ICD-10-CM

## 2011-11-30 DIAGNOSIS — D472 Monoclonal gammopathy: Secondary | ICD-10-CM

## 2011-11-30 LAB — CBC WITH DIFFERENTIAL/PLATELET
BASO%: 0.6 % (ref 0.0–2.0)
Basophils Absolute: 0 10*3/uL (ref 0.0–0.1)
HCT: 38.6 % (ref 34.8–46.6)
HGB: 13 g/dL (ref 11.6–15.9)
LYMPH%: 30.9 % (ref 14.0–49.7)
MCH: 32 pg (ref 25.1–34.0)
MCHC: 33.5 g/dL (ref 31.5–36.0)
MONO#: 0.3 10*3/uL (ref 0.1–0.9)
NEUT%: 59.2 % (ref 38.4–76.8)
Platelets: 251 10*3/uL (ref 145–400)
WBC: 4.3 10*3/uL (ref 3.9–10.3)

## 2011-11-30 LAB — PROTIME-INR
INR: 2.8 (ref 2.00–3.50)
Protime: 33.6 Seconds — ABNORMAL HIGH (ref 10.6–13.4)

## 2011-11-30 LAB — COMPREHENSIVE METABOLIC PANEL
AST: 23 U/L (ref 0–37)
Albumin: 3.5 g/dL (ref 3.5–5.2)
BUN: 10 mg/dL (ref 6–23)
Calcium: 9.7 mg/dL (ref 8.4–10.5)
Chloride: 106 mEq/L (ref 96–112)
Creatinine, Ser: 1.07 mg/dL (ref 0.50–1.10)
Glucose, Bld: 127 mg/dL — ABNORMAL HIGH (ref 70–99)

## 2011-11-30 LAB — LACTATE DEHYDROGENASE: LDH: 274 U/L — ABNORMAL HIGH (ref 94–250)

## 2011-11-30 MED ORDER — ENOXAPARIN SODIUM 80 MG/0.8ML ~~LOC~~ SOLN: 80.0000 mg | Freq: Two times a day (BID) | SUBCUTANEOUS | Status: DC

## 2011-11-30 NOTE — Progress Notes (Signed)
This office note has been dictated.  #[id number]

## 2011-12-01 NOTE — Progress Notes (Signed)
CC:   Dossie Der,, MD, Fax [phone] Jamas Lav, M.D. Petra Kuba, M.D.  PROBLEM LIST: 1. 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  Urine was also positive for IgG kappa     monoclonal protein.  A 24-hour urine protein at that time was     normal.  Metastatic bone survey carried out on 03/29/2003 was     negative.  Follow up immunoglobulins have been without any     significant changes.  There has been no suggestion of progression     to multiple myeloma. 2. History of leukopenia noted in November 2002. 3. History of schizoaffective disorder dating back to 65. 4. History of goiter treated with radioactive iodine in 2002,     resulting in hypothyroidism under treatment. 5. Osteoporosis. 6. History of pulmonary emboli, on June 24, 2010, without     precipitating factors.  Current recommendation is for ongoing     anticoagulation.  A hypercoagulable workup was negative. 7. Pulmonary hypertension being followed at [facility] by Dr. Talmadge Coventry. 8. 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 9. Positive family history of colon cancer.  The patient had a     colonoscopy in approximately 2008 and is scheduled for another     colonoscopy by Dr. Ewing Schlein on December 14, 2011.  MEDICATIONS: 1. Adcirca 20 mg twice daily. 2. Vitamin C daily. 3. Tracleer 125 mg twice daily. 4. Levothyroxine 112 mcg daily. 5. Lithium 300 mg twice daily. 6. Risperdal 2 mg daily. 7. Vitamin D 1000 units daily. 8. Coumadin, currently taking 11.25 mg 5 days a week and 7.5 mg on     Tuesdays and Thursdays.  HISTORY:  Michaela Horton is a 71 year old African American female who we follow for an IgG kappa monoclonal gammopathy that has been stable over the past 9 years.  The patient was last seen by [name] on 09/10/2011.  She was scheduled to see [name] again in 6 months, which should be around 07/10. The patient's immunoglobulins have been stable.  In fact, her IgG  level actually has decreased recently most currently 2140 on 09/10/2011.  IgA and IgM levels have been stable, although slightly low.  The reason for this patient's office visit today is to help adjust her Coumadin with Lovenox bridging in preparation for planned colonoscopy by Dr. Ewing Schlein, to take place at 1:45 p.m. on Monday, 04/15.  The patient is without any new complaints.  I believe this is a screening colonoscopy given her positive family history.  She offers no complaints or problems today.  She lives on her own, is independent, and drives.  She does tell me, however, that her oxygen was recently increased.  She is now on 4 L per minute.  PHYSICAL EXAM:  General: Michaela Horton looks well, is in no acute distress. As stated, she is on oxygen 4 L per minute.  Her O2 saturation at rest was 95%.  Vital signs: Weight today was 174 pounds, height 5 feet 1 inch, body surface area 1.84 m squared.  Blood pressure 115/79.  Other vital signs are normal.  HEENT: There is no scleral icterus.  Mouth and pharynx are benign.  There is no peripheral adenopathy palpable.  Heart and lungs are normal.  Breasts are not examined.  Abdomen: Somewhat obese with the patient sitting.  Extremities: No peripheral edema or clubbing.  Neurologic exam is grossly normal.  LABORATORY DATA:  Today, white count 4.3, ANC  2.5, hemoglobin 13.0, hematocrit 38.6, and platelets 251,000.  Pro-time today was 33.6 with an INR of 2.80.  The patient says her pro-time was checked about every month.  Chemistries from today notable for potassium of 3.3 and an LDH that was slightly elevated at 274,.  BUN was 10 creatinine 1.07.  These labs will be faxed to.  Dr. Camillo Flaming.  On 09/10/2011, the IgG level was 2140 as compared with 2650 on 03/05/2011, 2550 on 11/03/2010, 2790 on 04/16/2010, and 2940 on 01/13/2010.  IgA level was 70 and IgM 32 on 09/10/2011.  These values have had some minor fluctuations but really have not changed  appreciably over the years.  IMAGING STUDIES: 1. CT angiogram of the chest with IV contrast on 06/24/2010 showed     possible pulmonary embolism.  Blood clots were seen bilaterally in     segmental and subsegmental arteries.  Emboli appeared to be acute.     There were tiny bilateral pleural effusions.  Small to moderate     pericardial effusion. 2. CT scan of the abdomen and pelvis with IV contrast carried out on     08/15/2010 showed no CT-PET findings for metastatic disease     involving the abdomen or pelvis.  There were simple appearing     hepatic and right renal cysts. 3. Chest x-ray, 2 view, from 11/10/2010 showed no evidence for acute     cardiopulmonary disease. 4. Screening bilateral mammograms from 04/21/2011 showed no specific     mammographic evidence for malignancy. 5. Screening mammogram was recommended in 1 year.  IMPRESSION AND PLAN:  The patient is stable.  I am making the following recommendations regarding management of her anticoagulation:  I have suggested to the patient that she take her Coumadin on 04/10 but stop Coumadin thereafter until the evening following her colonoscopy, which would be the evening of 04/15.  She can then restart the Coumadin at the usual dose.  I am recommending that she use the following bridging for Lovenox at 1 mg/kg every 12 hours, which for this patient with a weight of 274 pounds comes out to approximately 80 mg every 12 hours.  I have instructed the patient to start taking Lovenox on Saturday, 04/13, in the morning. Ideally, an 8:00 am and 8:00 pm schedule.  She will take the Lovenox on Saturday 04/13 and Sunday 04/14.  Her last dose of Lovenox will be on Sunday evening, 04/14.  She will take no Lovenox on the day of her procedure, which will be 04/15.  If for some reason the time of the colonoscopy is moved up to the morning, then she needs to call us and we will omit the Lovenox dose for Sunday evening 04/14.  The patient  will then restart her Lovenox 80 mg every 12 hours on the day following her colonoscopy, which will be Tuesday, 04/16.  We will then check a pro- time on Friday, 04/19 and again on 04/22.  Further recommendations will follow depending on those pro times.  I anticipate that it will take at least a week, possibly a week and a half for even 2 weeks, to reestablish the patient's baseline with Coumadin.  When she appears to have therapeutic anticoagulation with Coumadin we will stop the Lovenox.  These instructions have been written out for the patient in detail.  She knows to call us should there be any questions or certainly any changes in the scheduling of her colonoscopy.  The patient does have an  appointment to see [name] again, I believe, around 07/10 for ongoing followup of her monoclonal gammopathy.   ______________________________ Samul Dada, M.D. DSM/MEDQ  D:  11/30/2011  T:  11/30/2011  Job:  [id number]

## 2011-12-03 ENCOUNTER — Telehealth: Payer: Self-pay

## 2011-12-03 NOTE — Telephone Encounter (Signed)
Pt called earlier stating Dr Ewing Schlein had given her instructions about stopping her coumadin and they were different from what Dr [name] had told her. She is bridging coumadin with lovenox for colonoscopy. I called Dr [name] office and his RN stated they gave her no instructions about coumadin. I told pt to follow Dr Murinson's written instructions

## 2011-12-14 ENCOUNTER — Other Ambulatory Visit: Payer: Self-pay | Admitting: Gastroenterology

## 2011-12-15 ENCOUNTER — Telehealth: Payer: Self-pay | Admitting: Oncology

## 2011-12-15 ENCOUNTER — Other Ambulatory Visit: Payer: Self-pay | Admitting: Medical Oncology

## 2011-12-15 NOTE — Telephone Encounter (Signed)
Called [facility] and they did get her ref. and tried to call her on 3/25 and she did not respond.  Called pt and she states they never called her.

## 2011-12-16 ENCOUNTER — Telehealth: Payer: Self-pay | Admitting: Medical Oncology

## 2011-12-16 NOTE — Telephone Encounter (Signed)
Pt called and states that she had her colonscopy Monday. She started having some bleeding with her bowel movements and her GI MD told her to hold her coumadin and lovenox. She is not sure how long to hold it. I told her I will discuss with Dr. [name] and call her back.

## 2011-12-17 ENCOUNTER — Telehealth: Payer: Self-pay

## 2011-12-17 ENCOUNTER — Other Ambulatory Visit: Payer: Self-pay | Admitting: Oncology

## 2011-12-17 NOTE — Telephone Encounter (Signed)
LVM: If pt still bleeding how much and how long. If pt not still bleeding to restart lovenox and coumadin. Please call us back.

## 2011-12-18 ENCOUNTER — Telehealth: Payer: Self-pay

## 2011-12-18 ENCOUNTER — Other Ambulatory Visit (HOSPITAL_BASED_OUTPATIENT_CLINIC_OR_DEPARTMENT_OTHER): Payer: Medicare Other | Admitting: Lab

## 2011-12-18 DIAGNOSIS — I2699 Other pulmonary embolism without acute cor pulmonale: Secondary | ICD-10-CM

## 2011-12-18 DIAGNOSIS — I2724 Chronic thromboembolic pulmonary hypertension: Secondary | ICD-10-CM

## 2011-12-18 LAB — PROTIME-INR

## 2011-12-18 NOTE — Telephone Encounter (Signed)
S/w pt: told her to continue lovenox and coumadin. She had only taken 7 mg coumadin last night. I told her DSM stated to take 11.25 mg. Her next pt/inr is 12/21/11. She expressed understanding.

## 2011-12-18 NOTE — Telephone Encounter (Signed)
S/w pt: she restarted lovenox and coumadin last night. She stated the bleeding had stopped. She has 0930 appt today for pt/inr and also on 4/22. Dr Juluis Rainier was monitoring coumadin before colonoscopy. I said I would make sure Dr Zachery Dauer gets a copy of labs.

## 2011-12-21 ENCOUNTER — Encounter: Payer: Self-pay | Admitting: Medical Oncology

## 2011-12-21 ENCOUNTER — Other Ambulatory Visit (HOSPITAL_BASED_OUTPATIENT_CLINIC_OR_DEPARTMENT_OTHER): Payer: Medicare Other | Admitting: Lab

## 2011-12-21 ENCOUNTER — Telehealth: Payer: Self-pay | Admitting: Medical Oncology

## 2011-12-21 ENCOUNTER — Other Ambulatory Visit: Payer: Self-pay | Admitting: Medical Oncology

## 2011-12-21 DIAGNOSIS — I2724 Chronic thromboembolic pulmonary hypertension: Secondary | ICD-10-CM

## 2011-12-21 DIAGNOSIS — I2699 Other pulmonary embolism without acute cor pulmonale: Secondary | ICD-10-CM

## 2011-12-21 LAB — PROTIME-INR

## 2011-12-21 NOTE — Telephone Encounter (Signed)
Spoke with pt per Dr. [name] to let her know to stay on the lovenox daily and continue her coumadin as prescribed. 7.5mg  Tues and Thurs and 11.25 all other days. We will recheck her PT/INR 12/24/11. She is aware the schedulers will call her with an appointment

## 2011-12-22 ENCOUNTER — Telehealth: Payer: Self-pay | Admitting: Medical Oncology

## 2011-12-22 NOTE — Telephone Encounter (Signed)
Pt called and is concerned that she will run out of her lovenox after her injection on Wed. Evening. I explained that she may be therapeutic when we draw her labs Thurs. If she is she will longer need the lovenox if not we will call her in a refill.

## 2011-12-23 ENCOUNTER — Telehealth: Payer: Self-pay | Admitting: Oncology

## 2011-12-23 NOTE — Telephone Encounter (Signed)
l/m with appt info    aom °

## 2011-12-24 ENCOUNTER — Encounter: Payer: Self-pay | Admitting: Medical Oncology

## 2011-12-24 ENCOUNTER — Other Ambulatory Visit (HOSPITAL_BASED_OUTPATIENT_CLINIC_OR_DEPARTMENT_OTHER): Payer: Medicare Other | Admitting: Lab

## 2011-12-24 DIAGNOSIS — I2724 Chronic thromboembolic pulmonary hypertension: Secondary | ICD-10-CM

## 2011-12-24 DIAGNOSIS — I2699 Other pulmonary embolism without acute cor pulmonale: Secondary | ICD-10-CM

## 2011-12-24 LAB — PROTIME-INR: Protime: 21.6 Seconds — ABNORMAL HIGH (ref 10.6–13.4)

## 2011-12-24 NOTE — Progress Notes (Signed)
I called pt and left her a message per Dr. [name] that she can stop her lovenox. She needs to continue her coumadin as prescribed and we will recheck her PT/INR 12/29/11. I asked her to call me to verify she got this messgage.

## 2011-12-29 ENCOUNTER — Telehealth: Payer: Self-pay | Admitting: Medical Oncology

## 2011-12-29 ENCOUNTER — Other Ambulatory Visit: Payer: Self-pay | Admitting: Medical Oncology

## 2011-12-29 ENCOUNTER — Telehealth: Payer: Self-pay | Admitting: Oncology

## 2011-12-29 ENCOUNTER — Ambulatory Visit (HOSPITAL_BASED_OUTPATIENT_CLINIC_OR_DEPARTMENT_OTHER): Payer: Medicare Other | Admitting: Lab

## 2011-12-29 DIAGNOSIS — I2724 Chronic thromboembolic pulmonary hypertension: Secondary | ICD-10-CM

## 2011-12-29 DIAGNOSIS — I2699 Other pulmonary embolism without acute cor pulmonale: Secondary | ICD-10-CM

## 2011-12-29 LAB — PROTIME-INR: Protime: 30 Seconds — ABNORMAL HIGH (ref 10.6–13.4)

## 2011-12-29 NOTE — Telephone Encounter (Signed)
called pt with pt inr workin for today  aom

## 2011-12-29 NOTE — Telephone Encounter (Signed)
I called pt and left her a message per Dr. [name] that her PT/INR is good. She needs to continue her coumadin as prescribed. We will fax this result to Dr. Zachery Dauer and have her resume monitoring her PT/INR.

## 2012-01-05 ENCOUNTER — Telehealth: Payer: Self-pay

## 2012-01-05 NOTE — Telephone Encounter (Signed)
Faxed pt progress note from 11/29/12 to Dr Lowell Guitar per DSM.

## 2012-02-03 ENCOUNTER — Other Ambulatory Visit: Payer: Self-pay | Admitting: Gastroenterology

## 2012-02-15 ENCOUNTER — Telehealth: Payer: Self-pay | Admitting: Oncology

## 2012-02-15 NOTE — Telephone Encounter (Signed)
called pt and moved her appt to 7/22(for tx pt)    aom

## 2012-02-17 ENCOUNTER — Other Ambulatory Visit: Payer: Self-pay | Admitting: Gastroenterology

## 2012-02-17 DIAGNOSIS — R109 Unspecified abdominal pain: Secondary | ICD-10-CM

## 2012-02-17 DIAGNOSIS — B9681 Helicobacter pylori [H. pylori] as the cause of diseases classified elsewhere: Secondary | ICD-10-CM

## 2012-02-25 ENCOUNTER — Ambulatory Visit
Admission: RE | Admit: 2012-02-25 | Discharge: 2012-02-25 | Disposition: A | Discharge status: Home / Self Care | Payer: Medicare Other | Source: Ambulatory Visit | Attending: Gastroenterology | Admitting: Gastroenterology

## 2012-02-25 DIAGNOSIS — B9681 Helicobacter pylori [H. pylori] as the cause of diseases classified elsewhere: Secondary | ICD-10-CM

## 2012-02-25 DIAGNOSIS — R109 Unspecified abdominal pain: Secondary | ICD-10-CM

## 2012-02-25 MED ORDER — IOHEXOL 300 MG/ML  SOLN: 100.0000 mL | Freq: Once | INTRAMUSCULAR | Status: AC | PRN

## 2012-03-10 ENCOUNTER — Ambulatory Visit: Payer: Medicare Other | Admitting: Oncology

## 2012-03-10 ENCOUNTER — Other Ambulatory Visit: Payer: Medicare Other | Admitting: Lab

## 2012-03-18 ENCOUNTER — Telehealth: Payer: Self-pay | Admitting: Nurse Practitioner

## 2012-03-18 NOTE — Telephone Encounter (Signed)
Encounter opened in error

## 2012-03-21 ENCOUNTER — Ambulatory Visit: Payer: Medicare Other | Admitting: Oncology

## 2012-03-21 ENCOUNTER — Other Ambulatory Visit: Payer: Medicare Other | Admitting: Lab

## 2012-03-24 ENCOUNTER — Telehealth: Payer: Self-pay | Admitting: Oncology

## 2012-03-24 NOTE — Telephone Encounter (Signed)
Pt lmonvm asking that 7/22 appt be r/s. Returned pt's call and lmonvm for pt re new appt for 9/9. Schedule mailed.

## 2012-05-05 ENCOUNTER — Other Ambulatory Visit: Payer: Self-pay | Admitting: Family Medicine

## 2012-05-05 DIAGNOSIS — Z1231 Encounter for screening mammogram for malignant neoplasm of breast: Secondary | ICD-10-CM

## 2012-05-09 ENCOUNTER — Ambulatory Visit: Payer: Medicare Other | Admitting: Oncology

## 2012-05-09 ENCOUNTER — Ambulatory Visit (HOSPITAL_BASED_OUTPATIENT_CLINIC_OR_DEPARTMENT_OTHER): Payer: Medicare Other | Admitting: Family

## 2012-05-09 ENCOUNTER — Encounter: Payer: Self-pay | Admitting: Family

## 2012-05-09 ENCOUNTER — Other Ambulatory Visit (HOSPITAL_BASED_OUTPATIENT_CLINIC_OR_DEPARTMENT_OTHER): Payer: Medicare Other | Admitting: Lab

## 2012-05-09 ENCOUNTER — Encounter: Payer: Self-pay | Admitting: Oncology

## 2012-05-09 ENCOUNTER — Telehealth: Payer: Self-pay | Admitting: Oncology

## 2012-05-09 VITALS — BP 125/78 | HR 55 | Temp 98.5°F | Resp 20 | Ht 61.0 in | Wt 167.4 lb

## 2012-05-09 DIAGNOSIS — D472 Monoclonal gammopathy: Secondary | ICD-10-CM

## 2012-05-09 DIAGNOSIS — Z800 Family history of malignant neoplasm of digestive organs: Secondary | ICD-10-CM

## 2012-05-09 DIAGNOSIS — Z86711 Personal history of pulmonary embolism: Secondary | ICD-10-CM

## 2012-05-09 LAB — COMPREHENSIVE METABOLIC PANEL (CC13)
ALT: 50 U/L (ref 0–55)
CO2: 23 mEq/L (ref 22–29)
Calcium: 9.3 mg/dL (ref 8.4–10.4)
Chloride: 109 mEq/L — ABNORMAL HIGH (ref 98–107)
Creatinine: 1.2 mg/dL — ABNORMAL HIGH (ref 0.6–1.1)
Glucose: 81 mg/dl (ref 70–99)
Sodium: 137 mEq/L (ref 136–145)
Total Bilirubin: 0.5 mg/dL (ref 0.20–1.20)
Total Protein: 7.3 g/dL (ref 6.4–8.3)

## 2012-05-09 LAB — CBC WITH DIFFERENTIAL/PLATELET
BASO%: 0.7 % (ref 0.0–2.0)
Basophils Absolute: 0 10*3/uL (ref 0.0–0.1)
EOS%: 1.9 % (ref 0.0–7.0)
HCT: 37.9 % (ref 34.8–46.6)
HGB: 12.4 g/dL (ref 11.6–15.9)
LYMPH%: 43.1 % (ref 14.0–49.7)
MCH: 29.8 pg (ref 25.1–34.0)
MCHC: 32.7 g/dL (ref 31.5–36.0)
NEUT%: 46 % (ref 38.4–76.8)
Platelets: 184 10*3/uL (ref 145–400)

## 2012-05-09 NOTE — Patient Instructions (Addendum)
Patient ID: Michaela Horton,   DOB: June 25, 1941,  MRN: [id number]   [facility] Discharge Instructions  RECOMMENDATIONS MAD BY THE CONSULTANT AND ANY TEST RESULT(S) WILL BE FORWARDED TO YOU REFERRING DOCTOR   EXAM FINDINGS BY NURSE PRACTITIONER TODAY TO REPORT TO THE CLINIC OR PRIMARY PROVIDER:    Your Current Medications Are: Current Outpatient Prescriptions  Medication Sig Dispense Refill  . ADCIRCA 20 MG TABS 1 tablet twice daily      . Ascorbic Acid (VITAMIN C PO) Take by mouth.        . bosentan (TRACLEER) 125 MG tablet Take 125 mg by mouth 2 (two) times daily.      Marland Kitchen levothyroxine (SYNTHROID, LEVOTHROID) 112 MCG tablet Take 1 tablet by mouth daily.      Marland Kitchen lithium 300 MG capsule Take 1 tablet by mouth. Twice daily      . risperiDONE (RISPERDAL) 2 MG tablet Take 1 tablet by mouth daily.      Marland Kitchen VITAMIN D, CHOLECALCIFEROL, PO Take 1,000 Units by mouth daily.       Marland Kitchen warfarin (COUMADIN) 7.5 MG tablet Take 1 tablet by mouth daily. Tues Thur Sun (1)  7.5mg  and all others days (1 1/2 tab) of 7.5mg          INSTRUCTIONS GIVEN, DISCUSSED AND FOLLOW-UP: Follow up with Dr. Ewing Schlein about colonoscopy.  I acknowledge that I have been informed and understand all the instructions given to me and have received a copy.  I do not have any further questions at this time, but I understand that I may call the [facility] at [phone] during business hours should I have any further questions or need assistance in obtaining follow-up care.   05/09/2012, 3:38 PM

## 2012-05-09 NOTE — Progress Notes (Signed)
This patient underwent a colonoscopy by Dr. Vida Rigger on 12/14/2011. Polyps were removed from the cecum and ascending colon. The pathology report indicated multiple fragments of serrated adenoma. No high-grade dysplasia or malignancy was identified. A polyp removed from the descending colon was a hyperplastic polyp.

## 2012-05-09 NOTE — Telephone Encounter (Signed)
appt made and printed for pt  °

## 2012-05-09 NOTE — Progress Notes (Signed)
Patient ID: Michaela Horton, female   DOB: 1941-02-15, 71 y.o.   MRN: [id number] CSN: [id number]  CC: Dossie Der,, MD, Fax [phone]  Jamas Lav, M.D.  Petra Kuba, M.D.   Problem List: Michaela Horton is a 71 y.o. African-American female with a problem list consisting of:  1. 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 Urine was also positive for IgG kappa  monoclonal protein. A 24-hour urine protein at that time was normal. Metastatic bone survey carried out on 03/29/2003 was negative. Follow up immunoglobulins have been without any significant changes. There has been no suggestion of progression  to multiple myeloma.  2. History of leukopenia noted in November 2002.  3. History of schizoaffective disorder dating back to 33.  4. History of goiter treated with radioactive iodine in 2002, resulting in hypothyroidism under treatment.  5. Osteoporosis.  6. History of pulmonary emboli, on June 24, 2010, without precipitating factors. Current recommendation is for ongoing anticoagulation. A hypercoagulable workup was negative.  7. Pulmonary hypertension being followed at [facility] by Dr. Talmadge Coventry.  8. Oxygen dependency - previously on 4 L per minute.  9. Positive family history of colon cancer. The patient had a colonoscopy on 12/14/2011.  Polyps were found in the cecum and ascending colon (serrated adenoma - no high grade dysplasia or malignancy identified).  Polypectomy performed Hyperplastic polyps were found in the descending colon (no adenomatous change or malignancy identified).  External and internal hemorrhoids, diverticulosis in the sigmoid colon.  Repeat colonoscopy for surveillance recommended and based on the pathology report once received.  Michaela Horton is a 71 year old African American female who we follow for an IgG kappa monoclonal gammopathy that has been stable over the past 9 years.   The patient is accompanied by her son Dr.  [name] today.  The patient was last seen by [name] on 11/30/2011.  The patient's immunoglobulins have been stable- her IgG level actually has decreased and  IgA and IgM levels have been stable, although slightly low. We were uncertain as to whether the patient received a screening colonoscopy and the patient is a poor historian.  We contacted Dr. [name] office and received a copy of the colonoscopy report.  She offers no complaints or problems today. She lives on her own, is independent, and drives. She is not on supplemental oxygen today during her office visit.   Dr. [name] and her son discussed in detail her laboratory trends.   Past Medical History: Past Medical History  Diagnosis Date  . Dyspnea   . Pulmonary embolism   . Breast cyst     right  . Hypothyroidism   . Renal cyst   . Leukocytopenia   . Monoclonal gammopathy   . Osteoarthritis   . Schizoaffective disorder   . Wears dentures   . Pulmonary hypertension   . Hypertension     pulmonary    Surgical History: Past Surgical History  Procedure Date  . Tubal ligation 1970  . Tubal ligation   . Breast surgery     cyst    Current Medications: Current Outpatient Prescriptions  Medication Sig Dispense Refill  . ADCIRCA 20 MG TABS 1 tablet twice daily      . Ascorbic Acid (VITAMIN C PO) Take by mouth.        . bosentan (TRACLEER) 125 MG tablet Take 125 mg by mouth 2 (two) times daily.      Marland Kitchen levothyroxine (SYNTHROID, LEVOTHROID) 112 MCG tablet Take 1  tablet by mouth daily.      Marland Kitchen lithium 300 MG capsule Take 1 tablet by mouth. Twice daily      . risperiDONE (RISPERDAL) 2 MG tablet Take 1 tablet by mouth daily.      Marland Kitchen VITAMIN D, CHOLECALCIFEROL, PO Take 1,000 Units by mouth daily.       Marland Kitchen warfarin (COUMADIN) 7.5 MG tablet Take 1 tablet by mouth daily. Tues Thur Sun (1)  7.5mg  and all others days (1 1/2 tab) of 7.5mg         Allergies: No Known Allergies  Family History: Family History  Problem Relation Age of Onset  .  Cancer Father     PROSTATE    Social History: History  Substance Use Topics  . Smoking status: Never Smoker   . Smokeless tobacco: Never Used  . Alcohol Use: No    Review of Systems: 10 Point review of systems was completed and is negative.   Physical Exam:   Blood pressure 125/78, pulse 55, temperature 98.5 F (36.9 C), temperature source Oral, resp. rate 20, height 5\' 1"  (1.549 m), weight 167 lb 6.4 oz (75.932 kg).  General appearance: Alert, cooperative, well nourished, no apparent distress Head: Normocephalic, without obvious abnormality, atraumatic Eyes: Conjunctivae injected, corneas cloudy, PERRLA, EOMI Nose: Nares, septum and mucosa are normal, no drainage or sinus tenderness. Neck: No adenopathy, supple, trachea midline, thyroid not enlarged, no tenderness Resp: Clear to auscultation bilaterally Cardio: Regular rate and rhythm, S1, S2 normal, no murmur, click, rub or gallop GI: Soft, non-tender, distended, hypoactive bowel sounds, no organomegaly Extremities: Extremities normal, atraumatic, no cyanosis or edema Lymph nodes: Cervical, supraclavicular, and axillary nodes normal Neurologic: Grossly normal, but slowed mentation at times   Laboratory Data: Results for orders placed in visit on 05/09/12 (from the past 48 hour(s))  CBC WITH DIFFERENTIAL     Status: Abnormal   Collection Time   05/09/12  2:13 PM      Component Value Range Comment   WBC 3.9  3.9 - 10.3 10e3/uL    NEUT# 1.8  1.5 - 6.5 10e3/uL    HGB 12.4  11.6 - 15.9 g/dL    HCT 40.9  81.1 - 91.4 %    Platelets 184  145 - 400 10e3/uL    MCV 91.3  79.5 - 101.0 fL    MCH 29.8  25.1 - 34.0 pg    MCHC 32.7  31.5 - 36.0 g/dL    RBC 7.82  9.56 - 2.13 10e6/uL    RDW 17.5 (*) 11.2 - 14.5 %    lymph# 1.7  0.9 - 3.3 10e3/uL    MONO# 0.3  0.1 - 0.9 10e3/uL    Eosinophils Absolute 0.1  0.0 - 0.5 10e3/uL    Basophils Absolute 0.0  0.0 - 0.1 10e3/uL    NEUT% 46.0  38.4 - 76.8 %    LYMPH% 43.1  14.0 - 49.7 %     MONO% 8.3  0.0 - 14.0 %    EOS% 1.9  0.0 - 7.0 %    BASO% 0.7  0.0 - 2.0 %   COMPREHENSIVE METABOLIC PANEL (CC13)     Status: Abnormal   Collection Time   05/09/12  2:14 PM      Component Value Range Comment   Sodium 137  136 - 145 mEq/L    Potassium 3.9  3.5 - 5.1 mEq/L    Chloride 109 (*) 98 - 107 mEq/L    CO2 23  22 -  29 mEq/L    Glucose 81  70 - 99 mg/dl    BUN 19.1  7.0 - 47.8 mg/dL    Creatinine 1.2 (*) 0.6 - 1.1 mg/dL    Total Bilirubin 2.95  0.20 - 1.20 mg/dL    Alkaline Phosphatase 45  40 - 150 U/L    AST 39 (*) 5 - 34 U/L    ALT 50  0 - 55 U/L    Total Protein 7.3  6.4 - 8.3 g/dL    Albumin 3.6  3.5 - 5.0 g/dL    Calcium 9.3  8.4 - 62.1 mg/dL      Imaging Studies: 1. CT angiogram of the chest with IV contrast on 06/24/2010 showed possible pulmonary embolism. Blood clots were seen bilaterally in segmental and subsegmental arteries. Emboli appeared to be acute. There were tiny bilateral pleural effusions. Small to moderate pericardial effusion.  2. CT scan of the abdomen and pelvis with IV contrast carried out on 08/15/2010 showed no CT-PET findings for metastatic disease involving the abdomen or pelvis. There were simple appearing hepatic and right renal cysts.  3. Chest x-ray, 2 view, from 11/10/2010 showed no evidence for acute cardiopulmonary disease.  4. Screening bilateral mammograms from 04/21/2011 showed no specific mammographic evidence for malignancy.  5. Next screening mammogram is scheduled for October 2013.  6. 12/14/2011 colonoscopy - Polyps were found in the cecum and ascending colon (serrated adenoma - no high grade dysplasia or malignancy identified).   Hyperplastic polyps were found in the descending colon (no adenomatous change or malignancy identified).  Polypectomy performed.  External and internal hemorrhoids, diverticulosis in the sigmoid colon.  Repeat colonoscopy for surveillance recommended and based on the pathology report once  received.    Impression/Plan: The patient is stable.  Quantitative immunoglobulins are pending at this time. The patient stated her PCP, Dr. Zachery Dauer is following her PT/INR.   The patient has another appointment to see [name] again on September 05, 2012 for ongoing follow up of her monoclonal gammopathy with laboratories LDH, CMP, LDH and QIG to be drawn.   Larina Bras, NP-C 05/09/2012, 3:44 PM

## 2012-05-10 LAB — IGG, IGA, IGM: IgM, Serum: 37 mg/dL — ABNORMAL LOW (ref 52–322)

## 2012-06-08 ENCOUNTER — Ambulatory Visit
Admission: RE | Admit: 2012-06-08 | Discharge: 2012-06-08 | Disposition: A | Discharge status: Home / Self Care | Payer: Medicare Other | Source: Ambulatory Visit | Attending: Family Medicine | Admitting: Family Medicine

## 2012-06-08 DIAGNOSIS — Z1231 Encounter for screening mammogram for malignant neoplasm of breast: Secondary | ICD-10-CM

## 2012-09-05 ENCOUNTER — Telehealth: Payer: Self-pay | Admitting: Oncology

## 2012-09-05 ENCOUNTER — Encounter: Payer: Self-pay | Admitting: Oncology

## 2012-09-05 ENCOUNTER — Other Ambulatory Visit (HOSPITAL_BASED_OUTPATIENT_CLINIC_OR_DEPARTMENT_OTHER): Payer: Medicare Other | Admitting: Lab

## 2012-09-05 ENCOUNTER — Ambulatory Visit (HOSPITAL_BASED_OUTPATIENT_CLINIC_OR_DEPARTMENT_OTHER): Payer: Medicare Other | Admitting: Oncology

## 2012-09-05 ENCOUNTER — Ambulatory Visit: Payer: Medicare Other | Admitting: Lab

## 2012-09-05 VITALS — BP 131/79 | HR 83 | Temp 98.0°F | Resp 20 | Ht 61.0 in | Wt 168.1 lb

## 2012-09-05 DIAGNOSIS — Z800 Family history of malignant neoplasm of digestive organs: Secondary | ICD-10-CM

## 2012-09-05 DIAGNOSIS — Z86711 Personal history of pulmonary embolism: Secondary | ICD-10-CM

## 2012-09-05 DIAGNOSIS — D472 Monoclonal gammopathy: Secondary | ICD-10-CM

## 2012-09-05 DIAGNOSIS — Z9981 Dependence on supplemental oxygen: Secondary | ICD-10-CM

## 2012-09-05 LAB — COMPREHENSIVE METABOLIC PANEL (CC13)
ALT: 35 U/L (ref 0–55)
AST: 29 U/L (ref 5–34)
Albumin: 3.3 g/dL — ABNORMAL LOW (ref 3.5–5.0)
Alkaline Phosphatase: 43 U/L (ref 40–150)
BUN: 16 mg/dL (ref 7.0–26.0)
Calcium: 9.8 mg/dL (ref 8.4–10.4)
Chloride: 105 mEq/L (ref 98–107)
Potassium: 4.3 mEq/L (ref 3.5–5.1)
Sodium: 140 mEq/L (ref 136–145)
Total Protein: 8 g/dL (ref 6.4–8.3)

## 2012-09-05 LAB — CBC WITH DIFFERENTIAL/PLATELET
BASO%: 0.9 % (ref 0.0–2.0)
EOS%: 1.7 % (ref 0.0–7.0)
HCT: 42.3 % (ref 34.8–46.6)
MCH: 31.9 pg (ref 25.1–34.0)
MCHC: 32.7 g/dL (ref 31.5–36.0)
MONO#: 0.3 10*3/uL (ref 0.1–0.9)
NEUT%: 39.9 % (ref 38.4–76.8)
RDW: 14.2 % (ref 11.2–14.5)
WBC: 2.9 10*3/uL — ABNORMAL LOW (ref 3.9–10.3)
lymph#: 1.3 10*3/uL (ref 0.9–3.3)

## 2012-09-05 LAB — LACTATE DEHYDROGENASE (CC13): LDH: 238 U/L (ref 125–245)

## 2012-09-05 LAB — IGG, IGA, IGM: IgM, Serum: 38 mg/dL — ABNORMAL LOW (ref 52–322)

## 2012-09-05 NOTE — Telephone Encounter (Signed)
Gave pt appt for May 12th with ML with labs, pt sent to labs today for urine collection container

## 2012-09-05 NOTE — Progress Notes (Signed)
This office note has been dictated.   #[id number]

## 2012-09-06 NOTE — Progress Notes (Signed)
CC:   Michaela Rainier, MD, Fax [phone] Michaela Horton, M.D.  PROBLEM LIST:  1. 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 Urine was also positive for IgG kappa  monoclonal protein. A 24-hour urine protein at that time was  normal. Metastatic bone survey carried out on 03/29/2003 was  negative. Follow up immunoglobulins have been without any  significant changes. There has been no suggestion of progression  to multiple myeloma.   2. History of leukopenia noted in November 2002.  3. History of schizoaffective disorder dating back to 18.  4. History of goiter treated with radioactive iodine in 2002,  resulting in hypothyroidism under treatment.  5. Osteoporosis.  6. History of pulmonary emboli, on June 24, 2010, without  precipitating factors. Current recommendation is for ongoing  anticoagulation. A hypercoagulable workup was negative.  7. Pulmonary hypertension being followed at [facility] by Dr. Talmadge Horton.  8. 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  9. Positive family history of colon cancer. The patient had a colonoscopy in 2008 and again on December 14, 2011, by Dr. Ewing Horton.  Pathology report indicated multiple fragments of a serrated adenoma.  No high-grade dysplasia or malignancy was identified.     MEDICATIONS:  Reviewed and recorded.   Current Outpatient Prescriptions  Medication Sig Dispense Refill  . ADCIRCA 20 MG TABS 1 tablet twice daily      . Ascorbic Acid (VITAMIN C PO) Take by mouth.        . bosentan (TRACLEER) 125 MG tablet Take 125 mg by mouth 2 (two) times daily.      Marland Kitchen levothyroxine (SYNTHROID, LEVOTHROID) 112 MCG tablet Take 1 tablet by mouth daily.      Marland Kitchen lithium 300 MG capsule Take 1 tablet by mouth. Twice daily      . risperiDONE (RISPERDAL) 2 MG tablet Take 1 tablet by mouth daily.      Marland Kitchen spironolactone (ALDACTONE) 50 MG tablet Take 50 mg by mouth daily.      Marland Kitchen VITAMIN D, CHOLECALCIFEROL, PO Take 1,000 Units by  mouth daily.       Marland Kitchen warfarin (COUMADIN) 7.5 MG tablet Take 7.5 mg by mouth daily. Mon Wed Fri 1 1/2 tabs and 1 tab all other days       Pneumovax status is unknown at this time.  The patient will confer with Dr. Zachery Horton.  Flu shot was given in the fall of 2013.   SMOKING HISTORY:  The patient has never smoked cigarettes.   HISTORY:  I saw Michaela Horton today for followup of an IgG kappa monoclonal protein that has been stable over the past 10 years.  [name] was last seen by [name] on 05/09/2012 and on 11/30/2011.  There apparently have been no major changes in her condition.  She is accompanied today by her son, Dr. Basilia Horton, an internist and emergency room physician.  The patient is really without any complaints today.  Her son says that she has had a nonproductive cough, but no fever or other signs of significant illness.  The patient denies any musculoskeletal pains.  She denies any recent hospitalizations, surgery, or emergency room visits. She did undergo a colonoscopy on 12/14/2011 by Dr. Vida Horton.  PHYSICAL EXAMINATION:  General:  The patient looks well.  There are no obvious changes.  She is well dressed.  Weight is 168.1 pounds, height 5 feet 1 inch, body surface area 1.81 sq m.  Vital Signs:  Blood  pressure 131/79.  Other vital signs are normal.  HEENT:  There is no scleral icterus.  Mouth and pharynx are benign.  Neck:  There is no peripheral adenopathy palpable.  Lungs:  Decreased breath sounds throughout, but lungs are clear.  Cardiac:  Regular rhythm without murmur or rub. Breasts:  Not examined.  Abdomen:  Generally soft, nontender, with no organomegaly or masses palpable.  Extremities:  Puffy with no edema. Port:  No central catheter or Port-A-Cath.  Neurologic:  Normal.  LABORATORY DATA:  Today white count 2.9, ANC 1.1, hemoglobin 13.8, hematocrit 42.3, platelets 155,000.  Chemistries today notable for an albumin of 3.3, down from 3.6 on 05/09/2012.  Total  protein was 8.0, up from 7.3 on 05/09/2012.  BUN and creatinine are fairly stable at 16.0 and 1.3, respectively.  Quantitative immunoglobulins today are pending. On 05/09/2012 IgG level was 2400 with normal being [phone].  IgA level was 71 and IgM 37 with normal being 52-322.  In general, quantitative immunoglobulins have shown some fluctuations, but been more or less stable.  For example, on 01/13/2010 the IgG level was 2940, whereas on 09/10/2011 IgG level was 2140.  The patient's prothrombin time is being followed by Dr. Zachery Horton.  IMAGING STUDIES:  1. CT angiogram of the chest with IV contrast on 06/24/2010 showed  possible pulmonary embolism. Blood clots were seen bilaterally in  segmental and subsegmental arteries. Emboli appeared to be acute.  There were tiny bilateral pleural effusions. Small to moderate  pericardial effusion.  2. CT scan of the abdomen and pelvis with IV contrast carried out on  08/15/2010 showed no CT-PET findings for metastatic disease  involving the abdomen or pelvis. There were simple appearing  hepatic and right renal cysts.  3. Chest x-ray, 2 view, from 11/10/2010 showed no evidence for acute  cardiopulmonary disease.  4. Screening bilateral mammograms from 04/21/2011 showed no specific  mammographic evidence for malignancy. 5. CT scan of abdomen and pelvis with IV contrast on 02/25/2012 showed that the duodenum was under-distended, making evaluation difficult.  Proximal jejunal wall appeared to be mildly thickened, possibly inflammatory in etiology.  There were tiny bilateral pleural effusions, right greater than left, and a tiny pericardial effusion. 6. Digital bilateral screening mammogram on 06/08/2012 was negative.   PROCEDURES:  Colonoscopy carried out by Dr. Vida Horton on 12/14/2011 showed that polyps were removed from the cecum and the ascending colon. The pathology report indicated multiple fragments of serrated adenoma. No high-grade  dysplasia or malignancy was identified.  A polyp removed from the descending colon was a hyperplastic polyp.   IMPRESSION AND PLAN:  Mrs. Kruser appears to be stable.  Today she has some mild changes of concern.  Specifically, her white count and absolute neutrophil count are slightly lower than they have been previously, although she has been noted to have mild leukopenia dating back to November 2002.  In addition, the total protein is 8.0 with albumin of 3.3 and thus, globulins of 4.7, slightly higher than they have been previously.  We await the results of the quantitative immunoglobulins today.  I would like to go ahead with another 24-hour urine collection for total protein and urine immunofixation electrophoresis.  It will be recalled that back when we initially evaluated the patient in the spring of 2004, the urine was positive for IgG kappa monoclonal protein.  A 24-hour urine protein at that time was normal.  I have asked the patient to pick up a container for her urine collection.  We will plan to see Mrs. Knotts again in mid May at the patient's son's request.  At that time we will check CBC, chemistries, and quantitative immunoglobulins.  She can see Norina Buzzard at that time.    ______________________________ Samul Dada, M.D. DSM/MEDQ  D:  09/05/2012  T:  09/05/2012  Job:  [id number]

## 2012-09-09 LAB — UIFE/LIGHT CHAINS/TP QN, 24-HR UR
Albumin, U: DETECTED
Alpha 1, Urine: DETECTED — AB
Alpha 2, Urine: DETECTED — AB
Beta, Urine: DETECTED — AB
Free Kappa/Lambda Ratio: 34.5 ratio — ABNORMAL HIGH (ref 2.04–10.37)
Free Lt Chn Excr Rate: 22.08 mg/d
Total Protein, Urine-Ur/day: 30 mg/d (ref 10–140)
Total Protein, Urine: 1.9 mg/dL

## 2012-11-03 ENCOUNTER — Telehealth: Payer: Self-pay | Admitting: Oncology

## 2012-11-03 NOTE — Telephone Encounter (Signed)
lm gv appt d/t.Marland Kitchentd

## 2012-12-07 ENCOUNTER — Encounter: Payer: Self-pay | Admitting: Oncology

## 2013-01-09 ENCOUNTER — Other Ambulatory Visit: Payer: Medicare Other | Admitting: Lab

## 2013-01-09 ENCOUNTER — Encounter: Payer: Self-pay | Admitting: Oncology

## 2013-01-09 ENCOUNTER — Ambulatory Visit: Payer: Medicare Other | Admitting: Family

## 2013-01-09 ENCOUNTER — Other Ambulatory Visit (HOSPITAL_BASED_OUTPATIENT_CLINIC_OR_DEPARTMENT_OTHER): Payer: Medicare Other | Admitting: Lab

## 2013-01-09 ENCOUNTER — Ambulatory Visit (HOSPITAL_BASED_OUTPATIENT_CLINIC_OR_DEPARTMENT_OTHER): Payer: Medicare Other | Admitting: Oncology

## 2013-01-09 ENCOUNTER — Telehealth: Payer: Self-pay | Admitting: Oncology

## 2013-01-09 VITALS — BP 127/73 | HR 72 | Temp 97.9°F | Resp 19 | Ht 61.0 in | Wt 170.9 lb

## 2013-01-09 DIAGNOSIS — Z86711 Personal history of pulmonary embolism: Secondary | ICD-10-CM

## 2013-01-09 DIAGNOSIS — D72819 Decreased white blood cell count, unspecified: Secondary | ICD-10-CM

## 2013-01-09 DIAGNOSIS — D472 Monoclonal gammopathy: Secondary | ICD-10-CM

## 2013-01-09 LAB — COMPREHENSIVE METABOLIC PANEL (CC13)
ALT: 20 U/L (ref 0–55)
AST: 16 U/L (ref 5–34)
Albumin: 3.4 g/dL — ABNORMAL LOW (ref 3.5–5.0)
Alkaline Phosphatase: 36 U/L — ABNORMAL LOW (ref 40–150)
BUN: 12 mg/dL (ref 7.0–26.0)
CO2: 26 meq/L (ref 22–29)
Calcium: 9.2 mg/dL (ref 8.4–10.4)
Chloride: 105 meq/L (ref 98–107)
Creatinine: 1.2 mg/dL — ABNORMAL HIGH (ref 0.6–1.1)
Glucose: 91 mg/dL (ref 70–99)
Potassium: 4.1 meq/L (ref 3.5–5.1)
Sodium: 138 meq/L (ref 136–145)
Total Bilirubin: 0.52 mg/dL (ref 0.20–1.20)
Total Protein: 7.9 g/dL (ref 6.4–8.3)

## 2013-01-09 LAB — CBC WITH DIFFERENTIAL/PLATELET
BASO%: 0.7 % (ref 0.0–2.0)
Basophils Absolute: 0 10*3/uL (ref 0.0–0.1)
EOS%: 1.8 % (ref 0.0–7.0)
HCT: 42.2 % (ref 34.8–46.6)
HGB: 13.9 g/dL (ref 11.6–15.9)
MCH: 32 pg (ref 25.1–34.0)
MCHC: 32.8 g/dL (ref 31.5–36.0)
MONO#: 0.3 10*3/uL (ref 0.1–0.9)
RDW: 13.7 % (ref 11.2–14.5)
WBC: 3.7 10*3/uL — ABNORMAL LOW (ref 3.9–10.3)
lymph#: 1.6 10*3/uL (ref 0.9–3.3)

## 2013-01-09 LAB — IGG, IGA, IGM
IgA: 68 mg/dL — ABNORMAL LOW (ref 69–380)
IgM, Serum: 31 mg/dL — ABNORMAL LOW (ref 52–322)

## 2013-01-09 LAB — LACTATE DEHYDROGENASE (CC13): LDH: 213 U/L (ref 125–245)

## 2013-01-09 NOTE — Progress Notes (Signed)
This office note has been dictated.  #[id number]

## 2013-01-11 NOTE — Progress Notes (Signed)
CC:   Juluis Rainier MD, Fax [phone] Petra Kuba, M.D.   PROBLEM LIST:  1. 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 Urine was also positive for IgG kappa  monoclonal protein. A 24-hour urine protein at that time was  normal. Metastatic bone survey carried out on 03/29/2003 was  negative. Follow up immunoglobulins have been without any  significant changes. There has been no suggestion of progression  to multiple myeloma.  2. History of leukopenia noted in November 2002.  3. History of schizoaffective disorder dating back to 2.  4. History of goiter treated with radioactive iodine in 2002,  resulting in hypothyroidism under treatment.  5. Osteoporosis.  6. History of pulmonary emboli, on June 24, 2010, without  precipitating factors. Current recommendation is for ongoing  anticoagulation. A hypercoagulable workup was negative.  7. Pulmonary hypertension being followed at [facility] by Dr. Talmadge Coventry.  8. 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  9. Positive family history of colon cancer. The patient had a colonoscopy in 2008 and again on December 14, 2011, by Dr. Ewing Schlein. Pathology report indicated multiple fragments of a serrated adenoma. No high-grade dysplasia or malignancy was identified.   MEDICATIONS:  Reviewed and recorded. Current Outpatient Prescriptions  Medication Sig Dispense Refill  . ADEMPAS 2.5 MG TABS 2.5 mg daily.       . Ascorbic Acid (VITAMIN C PO) Take by mouth.        . bosentan (TRACLEER) 125 MG tablet Take 125 mg by mouth 2 (two) times daily.      . furosemide (LASIX) 20 MG tablet Take 20 mg by mouth daily.       Marland Kitchen levothyroxine (SYNTHROID, LEVOTHROID) 112 MCG tablet Take 1 tablet by mouth daily.      Marland Kitchen lithium 300 MG capsule Take 1 tablet by mouth.       Marland Kitchen LORazepam (ATIVAN) 0.5 MG tablet Take 0.5 mg by mouth every 6 (six) hours as needed.       . risperiDONE (RISPERDAL) 2 MG tablet Take 1 tablet by mouth  daily.      Marland Kitchen spironolactone (ALDACTONE) 50 MG tablet Take 50 mg by mouth daily.      Marland Kitchen warfarin (COUMADIN) 7.5 MG tablet Take 7.5 mg by mouth daily. Mon Wed Fri 1 1/2 tabs and 1 tab all other days       No current facility-administered medications for this visit.     IMMUNIZATIONS:  -Pneumovax status is unknown at this time. The patient will confer with  Dr. Zachery Dauer.  -Flu shot was given in the fall of 2013.    SMOKING HISTORY: The patient has never smoked cigarettes.   HISTORY:  Michaela Horton was seen today for followup of her IgG kappa monoclonal protein which has been stable over the past 10 years.  Mrs. Ebright was last seen by [name] on 09/05/2012.  She is accompanied today by her son, Dr. Basilia Jumbo, an internists and emergency room physician. Mrs. Mckee is here on oxygen, currently 2 L/minute.  She states there has been no change in her condition over the past 4 months.  She is really without any complaints today.  Specifically, she denies any musculoskeletal pain.  PHYSICAL EXAMINATION:  General:  There are no obvious changes.  Weight is stable at 170 pounds 14.4 ounces, height 5 feet 1 inch, body surface area 1.83 sq m.  Vital Signs:  Blood pressure 127/73.  Other vital signs are normal.  HEENT:  There is no scleral icterus.  Mouth and pharynx are benign.  There is no peripheral adenopathy palpable.  Lungs:  Clear, but breath sounds are decreased.  Cardiac:  Regular rhythm without murmur or rub.  Breasts:  Not examined.  The patient has had screening mammogram on 06/08/2012.  Abdomen:  Soft, nontender, with no organomegaly or masses palpable.  Extremities:  Puffy without actual pitting edema. Neurologic:  Exam was normal.  The patient does not have a Port-A-Cath or central catheter.  O2 saturation on 2 L/minute by nasal cannula was 92%.  LABORATORY DATA:  Today, white count 3.7, ANC 1.7, hemoglobin 13.9, hematocrit 42.2, platelets 183,000.  Chemistries today notable for a  BUN of 12, creatinine 1.2, albumin 3.4, LDH 213.  Quantitative immunoglobulins are pending.  On 09/05/2012 IgG level was 2580, which is fairly stable.  IgA 85 and IgM 38, all fairly stable.  Twenty-four-hour urine protein collected on 09/07/2012 yielded 30 mg of protein.  The urine immunofixation electrophoresis showed monoclonal IgG heavy chain with associated kappa light chain and excess monoclonal free kappa light chains.  Urine free kappa-to-lambda ratio was 34.50 and the 24-hour kappa light chain excretion was 22.08 mg.  IMAGING STUDIES:  1. CT angiogram of the chest with IV contrast on 06/24/2010 showed  possible pulmonary embolism. Blood clots were seen bilaterally in  segmental and subsegmental arteries. Emboli appeared to be acute.  There were tiny bilateral pleural effusions. Small to moderate  pericardial effusion.  2. CT scan of the abdomen and pelvis with IV contrast carried out on  08/15/2010 showed no CT-PET findings for metastatic disease  involving the abdomen or pelvis. There were simple appearing  hepatic and right renal cysts.  3. Chest x-ray, 2 view, from 11/10/2010 showed no evidence for acute  cardiopulmonary disease.  4. Screening bilateral mammograms from 04/21/2011 showed no specific  mammographic evidence for malignancy.  5. CT scan of abdomen and pelvis with IV contrast on 02/25/2012 showed that  the duodenum was under-distended, making evaluation difficult. Proximal  jejunal wall appeared to be mildly thickened, possibly inflammatory in  etiology. There were tiny bilateral pleural effusions, right greater  than left, and a tiny pericardial effusion.  6. Digital bilateral screening mammogram on 06/08/2012 was negative.     PROCEDURES: Colonoscopy carried out by Dr. Vida Rigger on 12/14/2011  showed that polyps were removed from the cecum and the ascending colon.  The pathology report indicated multiple fragments of serrated adenoma.  No high-grade  dysplasia or malignancy was identified. A polyp removed  from the descending colon was a hyperplastic polyp.   IMPRESSION AND PLAN:  Mrs. Briere' clinical condition appears to be stable.  In addition, her serum and urine protein studies also seem to be stable.  Even though her IgG level has been significantly elevated, this appears to be a benign monoclonal gammopathy.  It appears that the patient has never had a bone marrow exam.  She has a very tiny amount of monoclonal protein in her urine.  Thus far, there has been no suggestion of progression to multiple myeloma.  In addition, this patient also has a mild asymptomatic and nonprogressive leukopenia.  The patient was reassured.  We will plan to see her again in 6 months, at which time we will check CBC, chemistries, and quantitative immunoglobulins.  I suggested that the patient may want to inquire about the use of Pradaxa and Xarelto as an alternative to Coumadin.  In addition, the patient may  want to check with Dr.  Zachery Dauer and Dr. Talmadge Coventry at [facility] regarding Pneumovax.  The patient is not sure if she has ever received this.  I will plan to see the patient again in 6 months.    ______________________________ Samul Dada, M.D. DSM/MEDQ  D:  01/09/2013  T:  01/10/2013  Job:  [id number]

## 2013-02-13 ENCOUNTER — Telehealth: Payer: Self-pay | Admitting: Internal Medicine

## 2013-02-13 ENCOUNTER — Encounter: Payer: Self-pay | Admitting: Internal Medicine

## 2013-02-13 NOTE — Telephone Encounter (Signed)
Spoke with pt She states that she is needing a letter for her to get on air plane with o2 I advised will call her when it's ready Letter created.

## 2013-02-14 ENCOUNTER — Telehealth: Payer: Self-pay | Admitting: Internal Medicine

## 2013-02-14 NOTE — Telephone Encounter (Signed)
Form completed and up front for the pt to pick up  [facility] for pt to be made aware form is ready

## 2013-02-14 NOTE — Telephone Encounter (Signed)
error 

## 2013-02-14 NOTE — Telephone Encounter (Signed)
Letter printed and placed in MW lookat to be singed

## 2013-02-14 NOTE — Telephone Encounter (Signed)
Letter up front for pick up Spoke with the pt and notified that this was done Nothing further needed

## 2013-02-14 NOTE — Telephone Encounter (Signed)
done

## 2013-02-16 ENCOUNTER — Encounter: Payer: Self-pay | Admitting: Oncology

## 2013-02-16 NOTE — Progress Notes (Signed)
Sent date of service 01/09/13 to billing to see why denied?

## 2013-03-22 ENCOUNTER — Other Ambulatory Visit: Payer: Self-pay

## 2013-03-22 DIAGNOSIS — Z1231 Encounter for screening mammogram for malignant neoplasm of breast: Secondary | ICD-10-CM

## 2013-03-25 ENCOUNTER — Encounter (HOSPITAL_COMMUNITY): Payer: Self-pay | Admitting: Emergency Medicine

## 2013-03-25 ENCOUNTER — Emergency Department (HOSPITAL_COMMUNITY)
Admission: EM | Admit: 2013-03-25 | Discharge: 2013-03-25 | Disposition: A | Discharge status: Home / Self Care | Payer: Medicare Other | Source: Home / Self Care | Attending: Emergency Medicine | Admitting: Emergency Medicine

## 2013-03-25 DIAGNOSIS — G4700 Insomnia, unspecified: Secondary | ICD-10-CM

## 2013-03-25 DIAGNOSIS — F319 Bipolar disorder, unspecified: Secondary | ICD-10-CM

## 2013-03-25 MED ORDER — ZOLPIDEM TARTRATE 5 MG PO TABS: 5.0000 mg | ORAL_TABLET | Freq: Every evening | ORAL | Status: DC | PRN

## 2013-03-25 NOTE — ED Provider Notes (Signed)
Chief Complaint:   Chief Complaint  Patient presents with  . Insomnia    History of Present Illness:   Michaela Horton is a 72-year-old female with a long history of bipolar disorder. Her psychiatrist recently stopped her lithium which she had been taking for about 30 years. He switched her to Lamictal instead because of a decrease in her creatinine. Ever since then, for the past 3 weeks, she has had insomnia. She has difficulty getting to sleep and staying asleep. She typically goes to bed around 10 PM, thus interns, then wakes up multiple times throughout the night with inability get back to sleep. Last night she woke up at 3 AM and was of the rest of the night. She's on respite all 2 mg at bedtime and the Lamictal which she was just started as well as Ativan. The patient denies anything else it's keeping her awake at night such as pain, restless legs, shortness of breath, or any other discomfort. She does have chronic lung disease and is on oxygen continuously at nighttime and as needed during the daytime. She denies any coughing, wheezing, or shortness of breath. She states she's mildly depressed but denies suicidal or homicidal ideation. She denies any hallucinations. She states that she is mildly paranoid. She denies any use of alcohol or recreational drugs.  Review of Systems:  Other than noted above, the patient denies any of the following symptoms: Systemic:  No fever, chills, sweats, fatigue, weight loss or gain. Resp:  No shortness of breath. Cardiovasc:  No chest pain, tightness, pressure, palpitations, dizziness, or syncope. GI:  No abdominal pain, nausea, vomiting, anorexia, diarrhea, or constipation. Neuro:  No headache, paresthesias, tremor, or muscle weakness. Psych:  No sadness, depression, crying, anxiety, panic, sleep disturbance, or suicidal or homicidal ideation.  No hallucinations or delusions.  PMFSH:  Past medical history, family history, social history, meds, and allergies were  reviewed.  She has a history of pulmonary embolism, hypothyroidism, monoclonal gammopathy, and pulmonary hypertension as well as systemic hypertension. She takes Adempas, Tracleer, furosemide, Synthroid, Ativan, Risperdal, Aldactone, and Coumadin.  Physical Exam:   Vital signs:  BP 128/80  Pulse 78  Temp(Src) 98.6 F (37 C) (Oral)  Resp 20  SpO2 92% Gen:  Alert, oriented, in no distress. Lungs:  No respiratory distress.  Breath sounds clear and equal bilaterally.  No wheezes, rales, or rhonchi. Heart:  Regular rthythm.  No gallops, murmers, clicks or rubs. Abdomen:  Soft, flat and nontender.  No organomegaly or mass. Neuro:  Alert and oriented times 3. Speech clear, fluent and appropriate.  Cranial nerves intact.  No focal weakness. Psych:  Mood and affect normal.  Speech pattern normal.  Thought content normal with no suicidal or homicidal ideation.  No paranoia, hallucinations, or delusions.  Memory, insight, and judgement normal.  Assessment:  The primary encounter diagnosis was Insomnia. A diagnosis of Bipolar disorder was also pertinent to this visit.   I am concerned that prolonged insomnia might have an effect on her bipolar disorder. She was given a small prescription for Ambien for sleep with instructions to followup with her psychiatrist within the next 10 days. I discussed at length with her sleep hygiene.  Plan:   1.  The following meds were prescribed:   Discharge Medication List as of 03/25/2013  7:25 PM    START taking these medications   Details  zolpidem (AMBIEN) 5 MG tablet Take 1 tablet (5 mg total) by mouth at bedtime as needed for sleep.,  Starting 03/25/2013, Until Discontinued, Print       2.  The patient was instructed in symptomatic care and handouts were given. 3.  The patient was told to return if becoming worse in any way, if no better in 3 or 4 days, and given some red flag symptoms including worsening symptoms or suicidal ideation that would indicate earlier  return. 4.  Follow up with her psychiatrist in 10 days.    Reuben Likes, MD 03/25/13 [phone]

## 2013-03-25 NOTE — ED Notes (Addendum)
Difficulty sleeping for 2-3 weeks.  Patient uses o2 at home, 3-4 liters/[place] as needed

## 2013-03-29 ENCOUNTER — Ambulatory Visit
Admission: RE | Admit: 2013-03-29 | Discharge: 2013-03-29 | Disposition: A | Discharge status: Home / Self Care | Payer: Medicare Other | Source: Ambulatory Visit

## 2013-03-29 DIAGNOSIS — Z1231 Encounter for screening mammogram for malignant neoplasm of breast: Secondary | ICD-10-CM

## 2013-04-16 ENCOUNTER — Other Ambulatory Visit: Payer: Self-pay

## 2013-04-16 ENCOUNTER — Encounter (HOSPITAL_COMMUNITY): Payer: Self-pay

## 2013-04-16 ENCOUNTER — Emergency Department (HOSPITAL_COMMUNITY)
Admission: EM | Admit: 2013-04-16 | Discharge: 2013-04-16 | Disposition: A | Discharge status: Home / Self Care | Payer: Medicare Other | Attending: Emergency Medicine | Admitting: Emergency Medicine

## 2013-04-16 DIAGNOSIS — S8012XA Contusion of left lower leg, initial encounter: Secondary | ICD-10-CM

## 2013-04-16 DIAGNOSIS — Z98811 Dental restoration status: Secondary | ICD-10-CM | POA: Insufficient documentation

## 2013-04-16 DIAGNOSIS — R609 Edema, unspecified: Secondary | ICD-10-CM | POA: Insufficient documentation

## 2013-04-16 DIAGNOSIS — Z8659 Personal history of other mental and behavioral disorders: Secondary | ICD-10-CM | POA: Insufficient documentation

## 2013-04-16 DIAGNOSIS — Z79899 Other long term (current) drug therapy: Secondary | ICD-10-CM | POA: Insufficient documentation

## 2013-04-16 DIAGNOSIS — J8410 Pulmonary fibrosis, unspecified: Secondary | ICD-10-CM

## 2013-04-16 DIAGNOSIS — Z86711 Personal history of pulmonary embolism: Secondary | ICD-10-CM | POA: Insufficient documentation

## 2013-04-16 DIAGNOSIS — Z87448 Personal history of other diseases of urinary system: Secondary | ICD-10-CM | POA: Insufficient documentation

## 2013-04-16 DIAGNOSIS — Z7901 Long term (current) use of anticoagulants: Secondary | ICD-10-CM | POA: Insufficient documentation

## 2013-04-16 DIAGNOSIS — Z862 Personal history of diseases of the blood and blood-forming organs and certain disorders involving the immune mechanism: Secondary | ICD-10-CM | POA: Insufficient documentation

## 2013-04-16 DIAGNOSIS — M7989 Other specified soft tissue disorders: Secondary | ICD-10-CM | POA: Insufficient documentation

## 2013-04-16 DIAGNOSIS — E039 Hypothyroidism, unspecified: Secondary | ICD-10-CM | POA: Insufficient documentation

## 2013-04-16 DIAGNOSIS — M1990 Unspecified osteoarthritis, unspecified site: Secondary | ICD-10-CM | POA: Insufficient documentation

## 2013-04-16 DIAGNOSIS — Z8639 Personal history of other endocrine, nutritional and metabolic disease: Secondary | ICD-10-CM | POA: Insufficient documentation

## 2013-04-16 DIAGNOSIS — R233 Spontaneous ecchymoses: Secondary | ICD-10-CM | POA: Insufficient documentation

## 2013-04-16 DIAGNOSIS — I2789 Other specified pulmonary heart diseases: Secondary | ICD-10-CM | POA: Insufficient documentation

## 2013-04-16 LAB — CBC WITH DIFFERENTIAL/PLATELET
Basophils Absolute: 0 10*3/uL (ref 0.0–0.1)
Eosinophils Relative: 3 % (ref 0–5)
Lymphocytes Relative: 34 % (ref 12–46)
Lymphs Abs: 1.2 10*3/uL (ref 0.7–4.0)
MCV: 94.7 fL (ref 78.0–100.0)
Neutro Abs: 2 10*3/uL (ref 1.7–7.7)
Platelets: 297 10*3/uL (ref 150–400)
RBC: 4.12 MIL/uL (ref 3.87–5.11)
RDW: 13.7 % (ref 11.5–15.5)
WBC: 3.7 10*3/uL — ABNORMAL LOW (ref 4.0–10.5)

## 2013-04-16 LAB — BASIC METABOLIC PANEL
Chloride: 104 mEq/L (ref 96–112)
GFR calc Af Amer: 44 mL/min — ABNORMAL LOW (ref >90)
GFR calc non Af Amer: 38 mL/min — ABNORMAL LOW (ref >90)
Potassium: 4.1 mEq/L (ref 3.5–5.1)
Sodium: 136 mEq/L (ref 135–145)

## 2013-04-16 LAB — PROTIME-INR
INR: 2.49 — ABNORMAL HIGH (ref 0.00–1.49)
Prothrombin Time: 26.1 seconds — ABNORMAL HIGH (ref 11.6–15.2)

## 2013-04-16 NOTE — ED Notes (Signed)
Pt wears intermittent oxygen at home.  States she has had increased shortness of breath at home.  Baseline oxygen 89% in triage.  Placed on nasal cannula.  Sats increased 90-91%.

## 2013-04-16 NOTE — ED Notes (Signed)
Per pt, has had leg swelling for 2 weeks with pain.  Pt had doppler done on last Friday.  Negative for clot.  Pain and swelling continue and is getting worse.  Pt on home oxygen in triage.

## 2013-04-16 NOTE — ED Provider Notes (Signed)
CSN: [id number]     Arrival date & time 04/16/13  0008 History     First MD Initiated Contact with Patient 04/16/13 0101     Chief Complaint  Patient presents with  . Leg Swelling  . Shortness of Breath   (Consider location/radiation/quality/duration/timing/severity/associated sxs/prior Treatment) HPI 72 yo female presents to the ER with complaint of 5 days of dark spot to the left leg, 2 weeks of swelling of both legs, left greater than right.  Pt reports she had normal doppler u/s of left leg last week.  Pt is on coumadin due to h/o PE.  She has pulmonary fibrosis from prior PE, pulmonary hypertension.  Pt is to wear 5 liters of 02 at all times, but admits to not wearing regularly.  Pt is concerned about the dark spot as it is tender when she pushes on it.  Pt also with h/o bipolar disease, saw her psychiatrist yesterday and had medications adjusted. Past Medical History  Diagnosis Date  . Dyspnea   . Pulmonary embolism   . Breast cyst     right  . Hypothyroidism   . Renal cyst   . Leukocytopenia   . Monoclonal gammopathy   . Osteoarthritis   . Schizoaffective disorder   . Wears dentures   . Pulmonary hypertension   . Hypertension     pulmonary   Past Surgical History  Procedure Laterality Date  . Tubal ligation  1970  . Tubal ligation    . Breast surgery      cyst   Family History  Problem Relation Age of Onset  . Cancer Father     PROSTATE   History  Substance Use Topics  . Smoking status: Never Smoker   . Smokeless tobacco: Never Used  . Alcohol Use: No   OB History   Grav Para Term Preterm Abortions TAB SAB Ect Mult Living                 Review of Systems  All other systems reviewed and are negative.    Allergies  Review of patient's allergies indicates no known allergies.  Home Medications   Current Outpatient Rx  Name  Route  Sig  Dispense  Refill  . ADEMPAS 2.5 MG TABS   Oral   Take 2.5 mg by mouth 3 (three) times daily.          . B  Complex-C (B-COMPLEX WITH VITAMIN C) tablet   Oral   Take 1 tablet by mouth every morning.         . bosentan (TRACLEER) 125 MG tablet   Oral   Take 125 mg by mouth 2 (two) times daily.         . calcium-vitamin D (OSCAL WITH D) 500-200 MG-UNIT per tablet   Oral   Take 1 tablet by mouth daily with breakfast.         . furosemide (LASIX) 20 MG tablet   Oral   Take 50 mg by mouth every morning.          Marland Kitchen guaifenesin (ROBITUSSIN) 100 MG/5ML syrup   Oral   Take 200 mg by mouth 3 (three) times daily as needed for cough or congestion.         Marland Kitchen lamoTRIgine (LAMICTAL) 100 MG tablet   Oral   Take 100 mg by mouth every morning.         Marland Kitchen levothyroxine (SYNTHROID, LEVOTHROID) 112 MCG tablet   Oral   Take 1  tablet by mouth daily before breakfast.          . LORazepam (ATIVAN) 0.5 MG tablet   Oral   Take 0.5 mg by mouth daily as needed for anxiety.          . Multiple Vitamin (MULTIVITAMIN WITH MINERALS) TABS tablet   Oral   Take 1 tablet by mouth every morning.         . risperiDONE (RISPERDAL) 2 MG tablet   Oral   Take 1-2 mg by mouth 2 (two) times daily. Take 1/2 tablet in the morning and 1 tablet at night         . vitamin C (ASCORBIC ACID) 500 MG tablet   Oral   Take 500 mg by mouth every morning.         . warfarin (COUMADIN) 7.5 MG tablet   Oral   Take 7.5-11.25 mg by mouth See admin instructions. Take 1 and 1/2 tablet Mon, Wed, and Fri and take 1 tablet all other days of the week.          BP 151/93  Pulse 106  Temp(Src) 99.3 F (37.4 C) (Oral)  Resp 22  SpO2 89% Physical Exam  Constitutional: She is oriented to person, place, and time. She appears well-developed and well-nourished.  HENT:  Head: Normocephalic and atraumatic.  Nose: Nose normal.  Mouth/Throat: Oropharynx is clear and moist.  Eyes: Conjunctivae and EOM are normal. Pupils are equal, round, and reactive to light.  Neck: Normal range of motion. Neck supple. No JVD  present. No tracheal deviation present. No thyromegaly present.  Cardiovascular: Normal rate, regular rhythm, normal heart sounds and intact distal pulses.  Exam reveals no gallop and no friction rub.   No murmur heard. Pulmonary/Chest: Breath sounds normal. No stridor. No respiratory distress. She has no wheezes. She has no rales. She exhibits no tenderness.  Increased effort  Abdominal: Soft. Bowel sounds are normal. She exhibits no distension and no mass. There is no tenderness. There is no rebound and no guarding.  Musculoskeletal: Normal range of motion. She exhibits edema (1+ edema on right, 2+ edema on left) and tenderness.  Ecchymosis noted to left lateral leg just below knee.  Mild ttp.  Lymphadenopathy:    She has no cervical adenopathy.  Neurological: She is oriented to person, place, and time. She has normal reflexes. No cranial nerve deficit. She exhibits normal muscle tone. Coordination normal.  Skin: Skin is warm and dry. No rash noted. No erythema. No pallor.  Psychiatric: Her behavior is normal. Judgment and thought content normal.  Labile mood, hypomanic    ED Course   Procedures (including critical care time)  Labs Reviewed  CBC WITH DIFFERENTIAL - Abnormal; Notable for the following:    WBC 3.7 (*)    All other components within normal limits  PROTIME-INR - Abnormal; Notable for the following:    Prothrombin Time 26.1 (*)    INR 2.49 (*)    All other components within normal limits  BASIC METABOLIC PANEL - Abnormal; Notable for the following:    Glucose, Bld 108 (*)    Creatinine, Ser 1.37 (*)    GFR calc non Af Amer 38 (*)    GFR calc Af Amer 44 (*)    All other components within normal limits   No results found. 1. Superficial bruising of lower leg, left, initial encounter   2. Pulmonary fibrosis     MDM  72 yo female with bruising to left  leg.  She does have asymmetrical edema, but reported negative doppler within the week.  Coumadin is therapeutic.   Will refer back to her pcm.  Olivia Mackie, MD 04/16/13 [phone]

## 2013-04-26 ENCOUNTER — Telehealth: Payer: Self-pay | Admitting: Internal Medicine

## 2013-04-26 NOTE — Telephone Encounter (Signed)
[id number] x1 

## 2013-04-26 NOTE — Telephone Encounter (Signed)
LMTCB

## 2013-04-27 NOTE — Telephone Encounter (Signed)
Duplicate message; will close message.

## 2013-04-27 NOTE — Telephone Encounter (Signed)
Spoke with patient-states she is currently on O2 through [facility] and wanted to know if there were any other O2 tanks that last longer than 6 hours; pt aware that she will need to speak with rep at [facility] to see what they have to fit her O2 needs.

## 2013-04-29 ENCOUNTER — Encounter (HOSPITAL_COMMUNITY): Payer: Self-pay | Admitting: *Deleted

## 2013-04-29 ENCOUNTER — Inpatient Hospital Stay (HOSPITAL_COMMUNITY)
Admission: EM | Admit: 2013-04-29 | Discharge: 2013-05-01 | DRG: 086 | Disposition: A | Discharge status: Home Health | Payer: Medicare Other | Attending: Internal Medicine | Admitting: Internal Medicine

## 2013-04-29 ENCOUNTER — Emergency Department (HOSPITAL_COMMUNITY): Payer: Medicare Other

## 2013-04-29 DIAGNOSIS — Z9981 Dependence on supplemental oxygen: Secondary | ICD-10-CM

## 2013-04-29 DIAGNOSIS — J9611 Chronic respiratory failure with hypoxia: Secondary | ICD-10-CM | POA: Diagnosis present

## 2013-04-29 DIAGNOSIS — I6200 Nontraumatic subdural hemorrhage, unspecified: Secondary | ICD-10-CM

## 2013-04-29 DIAGNOSIS — Z86711 Personal history of pulmonary embolism: Secondary | ICD-10-CM

## 2013-04-29 DIAGNOSIS — E860 Dehydration: Secondary | ICD-10-CM | POA: Diagnosis not present

## 2013-04-29 DIAGNOSIS — I2699 Other pulmonary embolism without acute cor pulmonale: Secondary | ICD-10-CM

## 2013-04-29 DIAGNOSIS — Z23 Encounter for immunization: Secondary | ICD-10-CM

## 2013-04-29 DIAGNOSIS — N179 Acute kidney failure, unspecified: Secondary | ICD-10-CM

## 2013-04-29 DIAGNOSIS — Z79899 Other long term (current) drug therapy: Secondary | ICD-10-CM

## 2013-04-29 DIAGNOSIS — S065XAA Traumatic subdural hemorrhage with loss of consciousness status unknown, initial encounter: Principal | ICD-10-CM | POA: Diagnosis present

## 2013-04-29 DIAGNOSIS — Z7901 Long term (current) use of anticoagulants: Secondary | ICD-10-CM

## 2013-04-29 DIAGNOSIS — S065X9A Traumatic subdural hemorrhage with loss of consciousness of unspecified duration, initial encounter: Secondary | ICD-10-CM

## 2013-04-29 DIAGNOSIS — E039 Hypothyroidism, unspecified: Secondary | ICD-10-CM | POA: Diagnosis present

## 2013-04-29 DIAGNOSIS — I2724 Chronic thromboembolic pulmonary hypertension: Secondary | ICD-10-CM

## 2013-04-29 DIAGNOSIS — I2789 Other specified pulmonary heart diseases: Secondary | ICD-10-CM

## 2013-04-29 DIAGNOSIS — D472 Monoclonal gammopathy: Secondary | ICD-10-CM | POA: Diagnosis present

## 2013-04-29 DIAGNOSIS — F259 Schizoaffective disorder, unspecified: Secondary | ICD-10-CM | POA: Diagnosis present

## 2013-04-29 DIAGNOSIS — M1990 Unspecified osteoarthritis, unspecified site: Secondary | ICD-10-CM | POA: Diagnosis present

## 2013-04-29 DIAGNOSIS — W06XXXA Fall from bed, initial encounter: Secondary | ICD-10-CM | POA: Diagnosis present

## 2013-04-29 DIAGNOSIS — E876 Hypokalemia: Secondary | ICD-10-CM | POA: Diagnosis not present

## 2013-04-29 DIAGNOSIS — J9610 Chronic respiratory failure, unspecified whether with hypoxia or hypercapnia: Secondary | ICD-10-CM

## 2013-04-29 LAB — CBC WITH DIFFERENTIAL/PLATELET
Basophils Absolute: 0 10*3/uL (ref 0.0–0.1)
Basophils Relative: 1 % (ref 0–1)
Eosinophils Absolute: 0.1 10*3/uL (ref 0.0–0.7)
MCH: 31.1 pg (ref 26.0–34.0)
MCHC: 33.1 g/dL (ref 30.0–36.0)
Monocytes Relative: 8 % (ref 3–12)
Neutrophils Relative %: 40 % — ABNORMAL LOW (ref 43–77)
Platelets: 189 10*3/uL (ref 150–400)
RDW: 14.2 % (ref 11.5–15.5)

## 2013-04-29 LAB — COMPREHENSIVE METABOLIC PANEL
Albumin: 3.2 g/dL — ABNORMAL LOW (ref 3.5–5.2)
Alkaline Phosphatase: 37 U/L — ABNORMAL LOW (ref 39–117)
BUN: 14 mg/dL (ref 6–23)
Potassium: 3.4 mEq/L — ABNORMAL LOW (ref 3.5–5.1)
Sodium: 140 mEq/L (ref 135–145)
Total Protein: 7.2 g/dL (ref 6.0–8.3)

## 2013-04-29 LAB — PROTIME-INR
INR: 2.02 — ABNORMAL HIGH (ref 0.00–1.49)
Prothrombin Time: 22.2 seconds — ABNORMAL HIGH (ref 11.6–15.2)

## 2013-04-29 LAB — POCT I-STAT, CHEM 8
BUN: 15 mg/dL (ref 6–23)
Creatinine, Ser: 1.5 mg/dL — ABNORMAL HIGH (ref 0.50–1.10)
Potassium: 3.5 mEq/L (ref 3.5–5.1)
Sodium: 141 mEq/L (ref 135–145)
TCO2: 29 mmol/L (ref 0–100)

## 2013-04-29 MED ORDER — RISPERIDONE 1 MG PO TABS
1.0000 mg | ORAL_TABLET | Freq: Every day | ORAL | Status: DC
  Administered 2013-04-30 – 2013-05-01 (×2): 1 mg via ORAL
  Filled 2013-04-29 (×2): qty 1

## 2013-04-29 MED ORDER — RISPERIDONE 2 MG PO TABS
2.0000 mg | ORAL_TABLET | Freq: Every day | ORAL | Status: DC
  Administered 2013-04-29 – 2013-04-30 (×2): 2 mg via ORAL
  Filled 2013-04-29 (×3): qty 1

## 2013-04-29 MED ORDER — VITAMIN C 500 MG PO TABS
500.0000 mg | ORAL_TABLET | Freq: Every morning | ORAL | Status: DC
  Administered 2013-04-30 – 2013-05-01 (×2): 500 mg via ORAL
  Filled 2013-04-29 (×2): qty 1

## 2013-04-29 MED ORDER — BOSENTAN 125 MG PO TABS
125.0000 mg | ORAL_TABLET | Freq: Two times a day (BID) | ORAL | Status: DC
  Administered 2013-04-29 – 2013-05-01 (×4): 125 mg via ORAL
  Filled 2013-04-29 (×6): qty 1

## 2013-04-29 MED ORDER — CALCIUM CARBONATE-VITAMIN D 500-200 MG-UNIT PO TABS
1.0000 | ORAL_TABLET | Freq: Every day | ORAL | Status: DC
  Administered 2013-04-30 – 2013-05-01 (×2): 1 via ORAL
  Filled 2013-04-29 (×3): qty 1

## 2013-04-29 MED ORDER — ADULT MULTIVITAMIN W/MINERALS CH
1.0000 | ORAL_TABLET | Freq: Every morning | ORAL | Status: DC
  Administered 2013-04-30 – 2013-05-01 (×2): 1 via ORAL
  Filled 2013-04-29 (×2): qty 1

## 2013-04-29 MED ORDER — ONDANSETRON HCL 4 MG PO TABS: 4.0000 mg | ORAL_TABLET | Freq: Four times a day (QID) | ORAL | Status: DC | PRN

## 2013-04-29 MED ORDER — GUAIFENESIN 100 MG/5ML PO SYRP
200.0000 mg | ORAL_SOLUTION | Freq: Three times a day (TID) | ORAL | Status: DC | PRN
  Filled 2013-04-29: qty 10

## 2013-04-29 MED ORDER — ACETAMINOPHEN 325 MG PO TABS: 650.0000 mg | ORAL_TABLET | Freq: Four times a day (QID) | ORAL | Status: DC | PRN

## 2013-04-29 MED ORDER — LORAZEPAM 0.5 MG PO TABS
0.5000 mg | ORAL_TABLET | Freq: Every day | ORAL | Status: DC | PRN
  Administered 2013-04-30: 0.5 mg via ORAL
  Filled 2013-04-29: qty 1

## 2013-04-29 MED ORDER — B COMPLEX-C PO TABS
1.0000 | ORAL_TABLET | Freq: Every morning | ORAL | Status: DC
  Administered 2013-04-30 – 2013-05-01 (×2): 1 via ORAL
  Filled 2013-04-29 (×2): qty 1

## 2013-04-29 MED ORDER — VITAMIN K1 10 MG/ML IJ SOLN
5.0000 mg | Freq: Once | INTRAVENOUS | Status: AC
  Administered 2013-04-29: 5 mg via INTRAVENOUS
  Filled 2013-04-29: qty 0.5

## 2013-04-29 MED ORDER — ACETAMINOPHEN 650 MG RE SUPP: 650.0000 mg | Freq: Four times a day (QID) | RECTAL | Status: DC | PRN

## 2013-04-29 MED ORDER — RIOCIGUAT 2.5 MG PO TABS
2.5000 mg | ORAL_TABLET | Freq: Three times a day (TID) | ORAL | Status: DC
  Administered 2013-04-30: 2.5 mg via ORAL
  Filled 2013-04-29: qty 1

## 2013-04-29 MED ORDER — FUROSEMIDE 10 MG/ML IJ SOLN: 20.0000 mg | Freq: Once | INTRAMUSCULAR | Status: DC

## 2013-04-29 MED ORDER — ONDANSETRON HCL 4 MG/2ML IJ SOLN: 4.0000 mg | Freq: Four times a day (QID) | INTRAMUSCULAR | Status: DC | PRN

## 2013-04-29 MED ORDER — PHYTONADIONE 5 MG PO TABS
5.0000 mg | ORAL_TABLET | Freq: Once | ORAL | Status: DC
  Filled 2013-04-29: qty 1

## 2013-04-29 MED ORDER — LAMOTRIGINE 150 MG PO TABS
150.0000 mg | ORAL_TABLET | Freq: Every morning | ORAL | Status: DC
  Administered 2013-04-30 – 2013-05-01 (×2): 150 mg via ORAL
  Filled 2013-04-29 (×2): qty 1

## 2013-04-29 MED ORDER — LEVOTHYROXINE SODIUM 112 MCG PO TABS
112.0000 ug | ORAL_TABLET | Freq: Every day | ORAL | Status: DC
  Administered 2013-04-30 – 2013-05-01 (×2): 112 ug via ORAL
  Filled 2013-04-29 (×3): qty 1

## 2013-04-29 NOTE — ED Notes (Signed)
Clarified for receiving Rn that units of plasma per [facility] lab will be avail for patient at [facility] at well.

## 2013-04-29 NOTE — ED Notes (Signed)
Bed: ZO10 Expected date: 04/29/13 Expected time: 3:17 PM Means of arrival:  Comments: Fall

## 2013-04-29 NOTE — ED Notes (Signed)
Admitting MD at bedside.

## 2013-04-29 NOTE — ED Notes (Signed)
Patient requesting food. Patient advised we can not feed her at this time, we are waiting on an admitting MD to evaluate her.

## 2013-04-29 NOTE — ED Notes (Signed)
Report given to [facility] 

## 2013-04-29 NOTE — ED Notes (Signed)
Patient is tearful very concerns about health status and not having family at bedside. Family is out of town at this time.

## 2013-04-29 NOTE — ED Notes (Signed)
EMS reports pt fell out of bed around 2pm, small bump on back of head, pt on blood thinners

## 2013-04-29 NOTE — ED Notes (Signed)
Requested a page placed to admitting MD to verify orders.

## 2013-04-29 NOTE — ED Notes (Signed)
Pt forgot to add 2 medicines to her daily list when discussing with parm tech.  Notified tech and asked them to return to room for update.

## 2013-04-29 NOTE — ED Provider Notes (Signed)
CSN: [id number]     Arrival date & time 04/29/13  1531 History   First MD Initiated Contact with Patient 04/29/13 1536     Chief Complaint  Patient presents with  . Fall    HPI Patient fell out of bed and and struck her head.  She had no loss of consciousness.  She denies headache currently.  Patient denies neck pain weakness.  Patient currently is on warfarin. Past Medical History  Diagnosis Date  . Dyspnea   . Pulmonary embolism   . Breast cyst     right  . Hypothyroidism   . Renal cyst   . Leukocytopenia   . Monoclonal gammopathy   . Osteoarthritis   . Schizoaffective disorder   . Wears dentures   . Pulmonary hypertension   . Hypertension     pulmonary   Past Surgical History  Procedure Laterality Date  . Tubal ligation  1970  . Tubal ligation    . Breast surgery      cyst   Family History  Problem Relation Age of Onset  . Cancer Father     PROSTATE   History  Substance Use Topics  . Smoking status: Never Smoker   . Smokeless tobacco: Never Used  . Alcohol Use: No   OB History   Grav Para Term Preterm Abortions TAB SAB Ect Mult Living                 Review of Systems All other systems reviewed and are negative Allergies  Review of patient's allergies indicates no known allergies.  Home Medications   Current Outpatient Rx  Name  Route  Sig  Dispense  Refill  . ADEMPAS 2.5 MG TABS   Oral   Take 2.5 mg by mouth 3 (three) times daily.          . B Complex-C (B-COMPLEX WITH VITAMIN C) tablet   Oral   Take 1 tablet by mouth every morning.         . bosentan (TRACLEER) 125 MG tablet   Oral   Take 125 mg by mouth 2 (two) times daily.         . calcium-vitamin D (OSCAL WITH D) 500-200 MG-UNIT per tablet   Oral   Take 1 tablet by mouth daily with breakfast.         . furosemide (LASIX) 20 MG tablet   Oral   Take 50 mg by mouth every morning.          Marland Kitchen guaifenesin (ROBITUSSIN) 100 MG/5ML syrup   Oral   Take 200 mg by mouth 3 (three)  times daily as needed for cough or congestion.         Marland Kitchen lamoTRIgine (LAMICTAL) 100 MG tablet   Oral   Take 150 mg by mouth every morning.          Marland Kitchen levothyroxine (SYNTHROID, LEVOTHROID) 112 MCG tablet   Oral   Take 1 tablet by mouth daily before breakfast.          . LORazepam (ATIVAN) 0.5 MG tablet   Oral   Take 0.5 mg by mouth daily as needed for anxiety.          . Multiple Vitamin (MULTIVITAMIN WITH MINERALS) TABS tablet   Oral   Take 1 tablet by mouth every morning.         . risperiDONE (RISPERDAL) 2 MG tablet   Oral   Take 1-2 mg by mouth 2 (two) times  daily. Take 1/2 tablet in the morning and 1 tablet at night         . vitamin C (ASCORBIC ACID) 500 MG tablet   Oral   Take 500 mg by mouth every morning.         . warfarin (COUMADIN) 7.5 MG tablet   Oral   Take 7.5-11.25 mg by mouth See admin instructions. Take 1 and 1/2 tablet Mon, Wed, and Fri and take 1 tablet all other days of the week.          BP 109/63  Pulse 73  Temp(Src) 98 F (36.7 C) (Oral)  Resp 23  SpO2 92% Physical Exam  Nursing note and vitals reviewed. Constitutional: She is oriented to person, place, and time. She appears well-developed and well-nourished. No distress.  HENT:  Head: Normocephalic.    Eyes: Pupils are equal, round, and reactive to light.  Neck: Normal range of motion.  Cardiovascular: Normal rate and intact distal pulses.   Pulmonary/Chest: No respiratory distress.  Abdominal: Normal appearance. She exhibits no distension.  Musculoskeletal: Normal range of motion.  Neurological: She is alert and oriented to person, place, and time. No cranial nerve deficit.  Skin: Skin is warm and dry. No rash noted.  Psychiatric: She has a normal mood and affect. Her behavior is normal.    ED Course  Procedures (including critical care time) Labs Review Labs Reviewed  PROTIME-INR - Abnormal; Notable for the following:    Prothrombin Time 22.2 (*)    INR 2.02 (*)      All other components within normal limits   discussed with neurosurgery who reviewed the CT scans and recommended admission with frequent neuro checks and repeat CT scan will consult as needed.  No neurosurgical intervention needed at this time.  Medications - No data to display  Imaging Review Ct Head Wo Contrast  04/29/2013   *RADIOLOGY REPORT*  Clinical Data: Fall  CT HEAD WITHOUT CONTRAST  Technique:  Contiguous axial images were obtained from the base of the skull through the vertex without contrast.  Comparison: None.  Findings: Age appropriate atrophy.  Mild chronic microvascular ischemia.  Mild hyperdensity along the falx may represent a small inter hemispheric subdural hematoma.  No other hemorrhage.  No mass effect or midline shift.  Negative for acute infarct, mass, or skull fracture.  Left parietal scalp hematoma.  IMPRESSION:   Probable small interhemispheric subdural hematoma.  Otherwise negative   Original Report Authenticated By: Janeece Riggers, M.D.    MDM   1. Subdural hematoma       Nelia Shi, MD 04/29/13 [phone]

## 2013-04-29 NOTE — H&P (Addendum)
[facility] History and Physical  Michaela Horton ZOX:[id number] DOB: [birth year] DOA: 04/29/2013  Referring physician: EDP PCP: [name], MD  Specialists:Pulm: Dr.Wert and [facility], Onc-Dr.Murinson  Chief Complaint: fall  HPI: Michaela Horton is a 72 y.o. female with h/o PE in 2011 on warfarin, severe Pulm HTN, Chronic resp failure on 4L home O2, MGUS, and schizoaffective disorder was in her usual state of health till this afternoon. She was resting in bed this afternoon and fell while getting out of bed, denies any syncope/prodrome, dizziness, CP or palpitations. While she fell she hit her head and subsequently came to the ER for further evaluation. In ER, noted to have INR of 2.4 and CT head with small Subdural hematoma, EDP discussed with neurosurgery who recommended admission to [facility], reversal and FU CT head, observation and no indication for Neurosurgical intervention at this time.   Review of Systems: The patient denies anorexia, fever, weight loss,, vision loss, decreased hearing, hoarseness, chest pain, syncope, dyspnea on exertion, peripheral edema, balance deficits, hemoptysis, abdominal pain, melena, hematochezia, severe indigestion/heartburn, hematuria, incontinence, genital sores, muscle weakness, suspicious skin lesions, transient blindness, difficulty walking, depression, unusual weight change, abnormal bleeding, enlarged lymph nodes, angioedema, and breast masses.    Past Medical History  Diagnosis Date  . Dyspnea   . Pulmonary embolism   . Breast cyst     right  . Hypothyroidism   . Renal cyst   . Leukocytopenia   . Monoclonal gammopathy   . Osteoarthritis   . Schizoaffective disorder   . Wears dentures   . Pulmonary hypertension   . Hypertension     pulmonary   Past Surgical History  Procedure Laterality Date  . Tubal ligation  1970  . Tubal ligation    . Breast surgery      cyst   Social History:  reports that she has never smoked. She has  never used smokeless tobacco. She reports that she does not drink alcohol or use illicit drugs. Lives at home alone, independent in [facility]  No Known Allergies  Family History  Problem Relation Age of Onset  . Cancer Father     PROSTATE    Prior to Admission medications   Medication Sig Start Date End Date Taking? Authorizing Provider  ADEMPAS 2.5 MG TABS Take 2.5 mg by mouth 3 (three) times daily.  12/28/12  Yes Historical Provider, MD  B Complex-C (B-COMPLEX WITH VITAMIN C) tablet Take 1 tablet by mouth every morning.   Yes Historical Provider, MD  bosentan (TRACLEER) 125 MG tablet Take 125 mg by mouth 2 (two) times daily.   Yes Historical Provider, MD  calcium-vitamin D (OSCAL WITH D) 500-200 MG-UNIT per tablet Take 1 tablet by mouth daily with breakfast.   Yes Historical Provider, MD  furosemide (LASIX) 20 MG tablet Take 50 mg by mouth every morning.  12/20/12  Yes Historical Provider, MD  guaifenesin (ROBITUSSIN) 100 MG/5ML syrup Take 200 mg by mouth 3 (three) times daily as needed for cough or congestion.   Yes Historical Provider, MD  lamoTRIgine (LAMICTAL) 100 MG tablet Take 150 mg by mouth every morning.    Yes Historical Provider, MD  levothyroxine (SYNTHROID, LEVOTHROID) 112 MCG tablet Take 1 tablet by mouth daily before breakfast.  10/09/10  Yes Historical Provider, MD  LORazepam (ATIVAN) 0.5 MG tablet Take 0.5 mg by mouth daily as needed for anxiety.  10/04/12  Yes Historical Provider, MD  Multiple Vitamin (MULTIVITAMIN WITH MINERALS) TABS tablet Take 1 tablet by mouth every  morning.   Yes Historical Provider, MD  risperiDONE (RISPERDAL) 2 MG tablet Take 1-2 mg by mouth 2 (two) times daily. Take 1/2 tablet in the morning and 1 tablet at night 12/15/10  Yes Historical Provider, MD  vitamin C (ASCORBIC ACID) 500 MG tablet Take 500 mg by mouth every morning.   Yes Historical Provider, MD  warfarin (COUMADIN) 7.5 MG tablet Take 7.5-11.25 mg by mouth See admin instructions. Take 1 and 1/2  tablet Mon, Wed, and Fri and take 1 tablet all other days of the week.   Yes Historical Provider, MD   Physical Exam: Filed Vitals:   04/29/13 1533  BP: 109/63  Pulse: 73  Temp: 98 F (36.7 C)  Resp: 23     General:  AAOx3, anxious tearful  HEENT: PERRLA, EOMI  Cardiovascular: S1S2/RRR  Respiratory: CTAB  Abdomen: soft, Nt, ND, BS present  Skin: no rahses, warm  Musculoskeletal:no edema c/c  Psychiatric:anxious mood and affect Neurologic:strength 5/5 both ext, sensations intact DTR 2 plus, plantars withdarwal   Labs on Admission:  Basic Metabolic Panel: No results found for this basename: NA, K, CL, CO2, GLUCOSE, BUN, CREATININE, CALCIUM, MG, PHOS,  in the last 168 hours Liver Function Tests: No results found for this basename: AST, ALT, ALKPHOS, BILITOT, PROT, ALBUMIN,  in the last 168 hours No results found for this basename: LIPASE, AMYLASE,  in the last 168 hours No results found for this basename: AMMONIA,  in the last 168 hours CBC: No results found for this basename: WBC, NEUTROABS, HGB, HCT, MCV, PLT,  in the last 168 hours Cardiac Enzymes: No results found for this basename: CKTOTAL, CKMB, CKMBINDEX, TROPONINI,  in the last 168 hours  BNP (last 3 results) No results found for this basename: PROBNP,  in the last 8760 hours CBG: No results found for this basename: GLUCAP,  in the last 168 hours  Radiological Exams on Admission: Ct Head Wo Contrast  04/29/2013   *RADIOLOGY REPORT*  Clinical Data: Fall  CT HEAD WITHOUT CONTRAST  Technique:  Contiguous axial images were obtained from the base of the skull through the vertex without contrast.  Comparison: None.  Findings: Age appropriate atrophy.  Mild chronic microvascular ischemia.  Mild hyperdensity along the falx may represent a small inter hemispheric subdural hematoma.  No other hemorrhage.  No mass effect or midline shift.  Negative for acute infarct, mass, or skull fracture.  Left parietal scalp hematoma.   IMPRESSION:   Probable small interhemispheric subdural hematoma.  Otherwise negative   Original Report Authenticated By: Janeece Riggers, M.D.     Assessment/Plan 1. Small Subdural hematoma -anticoagulated with warfarin -stop Warfarin, Vitamin K IV x1  -2Units FFP -Neuro checks -CT head in 48hours - Neurosurgery consulted, d/w Dr.Hirsh who recommended FU CT head in 48hours or sooner if symptomatic, No indication for intervention at this time due to size and interhemispheric location:   2. Chronic thromboembolic Pulm HTN, h/o PE in 2011 -continue Adempas and Bosentan -hold warfarin due to 1 -h/o IVC filter, but poor recollection, I had radiology review prior films which note presence of filter in 2013, will check KUB to confirm presence now -followed by [facility] and Dr.Wert  3. Chronic resp failure  -due to 2 -continue home O2  4. Schizoaffective disorder -continue risperdone at home dose  5. Hypothyroidism -continue synthroid  6. MGUS -on surveillance per dr.Murinson  Code Status: DNR Family Communication:none at bedside Disposition Plan: Transfer to [facility]  Time spent:  Clovis Warwick  [facility] Pager [phone]  If 7PM-7AM, please contact night-coverage [email or web address] Password [facility] 04/29/2013, 6:42 PM

## 2013-04-30 ENCOUNTER — Inpatient Hospital Stay (HOSPITAL_COMMUNITY): Payer: Medicare Other

## 2013-04-30 LAB — CBC
HCT: 35 % — ABNORMAL LOW (ref 36.0–46.0)
Hemoglobin: 11.7 g/dL — ABNORMAL LOW (ref 12.0–15.0)
WBC: 3.1 10*3/uL — ABNORMAL LOW (ref 4.0–10.5)

## 2013-04-30 LAB — BASIC METABOLIC PANEL
BUN: 13 mg/dL (ref 6–23)
CO2: 29 mEq/L (ref 19–32)
Chloride: 102 mEq/L (ref 96–112)
Glucose, Bld: 89 mg/dL (ref 70–99)
Potassium: 3.9 mEq/L (ref 3.5–5.1)

## 2013-04-30 LAB — PROTIME-INR: INR: 1.24 (ref 0.00–1.49)

## 2013-04-30 LAB — ABO/RH: ABO/RH(D): O POS

## 2013-04-30 LAB — TYPE AND SCREEN

## 2013-04-30 MED ORDER — OXYCODONE HCL 5 MG PO TABS
5.0000 mg | ORAL_TABLET | ORAL | Status: DC | PRN
  Administered 2013-04-30: 5 mg via ORAL
  Filled 2013-04-30: qty 1

## 2013-04-30 MED ORDER — LORAZEPAM 0.5 MG PO TABS
0.5000 mg | ORAL_TABLET | Freq: Three times a day (TID) | ORAL | Status: DC | PRN
  Administered 2013-04-30: 0.5 mg via ORAL
  Filled 2013-04-30: qty 1

## 2013-04-30 MED ORDER — PNEUMOCOCCAL VAC POLYVALENT 25 MCG/0.5ML IJ INJ
0.5000 mL | INJECTION | INTRAMUSCULAR | Status: AC
  Administered 2013-05-01: 0.5 mL via INTRAMUSCULAR
  Filled 2013-04-30: qty 0.5

## 2013-04-30 MED ORDER — POTASSIUM CHLORIDE CRYS ER 20 MEQ PO TBCR
40.0000 meq | EXTENDED_RELEASE_TABLET | Freq: Once | ORAL | Status: AC
  Administered 2013-04-30: 40 meq via ORAL
  Filled 2013-04-30: qty 2

## 2013-04-30 NOTE — Consult Note (Signed)
Reason for Consult:SDH Referring Physician: Zannie Cove, MD   Michaela Horton is an 72 y.o. female.  HPI: Pt on coumadin , fell out of bed  - went to ER and CT showed small falx SDH .  Pt with no c/o  PMH:  Past Medical History  Diagnosis Date  . Dyspnea   . Pulmonary embolism   . Breast cyst     right  . Hypothyroidism   . Renal cyst   . Leukocytopenia   . Monoclonal gammopathy   . Osteoarthritis   . Schizoaffective disorder   . Wears dentures   . Pulmonary hypertension   . Hypertension     pulmonary    Past Surgical History  Procedure Laterality Date  . Tubal ligation  1970  . Tubal ligation    . Breast surgery      cyst    Family History:  Family History  Problem Relation Age of Onset  . Cancer Father     PROSTATE    Social History:  reports that she has never smoked. She has never used smokeless tobacco. She reports that she does not drink alcohol or use illicit drugs.  Allergies: No Known Allergies  Prior to Admission medications   Medication Sig Start Date End Date Taking? Authorizing Provider  ADEMPAS 2.5 MG TABS Take 2.5 mg by mouth 3 (three) times daily.  12/28/12  Yes Historical Provider, MD  B Complex-C (B-COMPLEX WITH VITAMIN C) tablet Take 1 tablet by mouth every morning.   Yes Historical Provider, MD  bosentan (TRACLEER) 125 MG tablet Take 125 mg by mouth 2 (two) times daily.   Yes Historical Provider, MD  calcium-vitamin D (OSCAL WITH D) 500-200 MG-UNIT per tablet Take 1 tablet by mouth daily with breakfast.   Yes Historical Provider, MD  furosemide (LASIX) 20 MG tablet Take 50 mg by mouth every morning.  12/20/12  Yes Historical Provider, MD  guaifenesin (ROBITUSSIN) 100 MG/5ML syrup Take 200 mg by mouth 3 (three) times daily as needed for cough or congestion.   Yes Historical Provider, MD  lamoTRIgine (LAMICTAL) 100 MG tablet Take 150 mg by mouth every morning.    Yes Historical Provider, MD  levothyroxine (SYNTHROID, LEVOTHROID) 112 MCG tablet  Take 1 tablet by mouth daily before breakfast.  10/09/10  Yes Historical Provider, MD  LORazepam (ATIVAN) 0.5 MG tablet Take 0.5 mg by mouth daily as needed for anxiety.  10/04/12  Yes Historical Provider, MD  Multiple Vitamin (MULTIVITAMIN WITH MINERALS) TABS tablet Take 1 tablet by mouth every morning.   Yes Historical Provider, MD  risperiDONE (RISPERDAL) 2 MG tablet Take 1-2 mg by mouth 2 (two) times daily. Take 1/2 tablet in the morning and 1 tablet at night 12/15/10  Yes Historical Provider, MD  vitamin C (ASCORBIC ACID) 500 MG tablet Take 500 mg by mouth every morning.   Yes Historical Provider, MD  warfarin (COUMADIN) 7.5 MG tablet Take 7.5-11.25 mg by mouth See admin instructions. Take 1 and 1/2 tablet Mon, Wed, and Fri and take 1 tablet all other days of the week.   Yes Historical Provider, MD    Results for orders placed during the hospital encounter of 04/29/13 (from the past 48 hour(s))  PROTIME-INR     Status: Abnormal   Collection Time    04/29/13  4:50 PM      Result Value Range   Prothrombin Time 22.2 (*) 11.6 - 15.2 seconds   INR 2.02 (*) 0.00 - 1.49  POCT  I-STAT, CHEM 8     Status: Abnormal   Collection Time    04/29/13  4:55 PM      Result Value Range   Sodium 141  135 - 145 mEq/L   Potassium 3.5  3.5 - 5.1 mEq/L   Chloride 100  96 - 112 mEq/L   BUN 15  6 - 23 mg/dL   Creatinine, Ser 8.11 (*) 0.50 - 1.10 mg/dL   Glucose, Bld 98  70 - 99 mg/dL   Calcium, Ion 9.14  7.82 - 1.30 mmol/L   TCO2 29  0 - 100 mmol/L   Hemoglobin 12.9  12.0 - 15.0 g/dL   HCT 95.6  21.3 - 08.6 %  CBC WITH DIFFERENTIAL     Status: Abnormal   Collection Time    04/29/13  8:21 PM      Result Value Range   WBC 2.8 (*) 4.0 - 10.5 K/uL   RBC 4.02  3.87 - 5.11 MIL/uL   Hemoglobin 12.5  12.0 - 15.0 g/dL   HCT 57.8  46.9 - 62.9 %   MCV 94.0  78.0 - 100.0 fL   MCH 31.1  26.0 - 34.0 pg   MCHC 33.1  30.0 - 36.0 g/dL   RDW 52.8  41.3 - 24.4 %   Platelets 189  150 - 400 K/uL   Neutrophils Relative %  40 (*) 43 - 77 %   Neutro Abs 1.1 (*) 1.7 - 7.7 K/uL   Lymphocytes Relative 48 (*) 12 - 46 %   Lymphs Abs 1.3  0.7 - 4.0 K/uL   Monocytes Relative 8  3 - 12 %   Monocytes Absolute 0.2  0.1 - 1.0 K/uL   Eosinophils Relative 4  0 - 5 %   Eosinophils Absolute 0.1  0.0 - 0.7 K/uL   Basophils Relative 1  0 - 1 %   Basophils Absolute 0.0  0.0 - 0.1 K/uL  COMPREHENSIVE METABOLIC PANEL     Status: Abnormal   Collection Time    04/29/13  8:21 PM      Result Value Range   Sodium 140  135 - 145 mEq/L   Potassium 3.4 (*) 3.5 - 5.1 mEq/L   Chloride 103  96 - 112 mEq/L   CO2 31  19 - 32 mEq/L   Glucose, Bld 90  70 - 99 mg/dL   BUN 14  6 - 23 mg/dL   Creatinine, Ser 0.10 (*) 0.50 - 1.10 mg/dL   Calcium 9.1  8.4 - 27.2 mg/dL   Total Protein 7.2  6.0 - 8.3 g/dL   Albumin 3.2 (*) 3.5 - 5.2 g/dL   AST 18  0 - 37 U/L   ALT 18  0 - 35 U/L   Alkaline Phosphatase 37 (*) 39 - 117 U/L   Total Bilirubin 0.4  0.3 - 1.2 mg/dL   GFR calc non Af Amer 42 (*) >90 mL/min   GFR calc Af Amer 48 (*) >90 mL/min   Comment: (NOTE)     The eGFR has been calculated using the CKD EPI equation.     This calculation has not been validated in all clinical situations.     eGFR's persistently <90 mL/min signify possible Chronic Kidney     Disease.  TYPE AND SCREEN     Status: None   Collection Time    04/29/13 11:45 PM      Result Value Range   ABO/RH(D) O POS  Antibody Screen NEG     Sample Expiration 05/02/2013    ABO/RH     Status: None   Collection Time    04/29/13 11:45 PM      Result Value Range   ABO/RH(D) O POS    PREPARE FRESH FROZEN PLASMA     Status: None   Collection Time    04/29/13 11:59 PM      Result Value Range   Unit Number [id number]     Blood Component Type THAWED PLASMA     Unit division 00     Status of Unit ALLOCATED     Transfusion Status OK TO TRANSFUSE     Unit Number [id number]     Blood Component Type THAWED PLASMA     Unit division 00     Status of Unit ISSUED      Transfusion Status OK TO TRANSFUSE      Ct Head Wo Contrast  04/29/2013   *RADIOLOGY REPORT*  Clinical Data: Fall  CT HEAD WITHOUT CONTRAST  Technique:  Contiguous axial images were obtained from the base of the skull through the vertex without contrast.  Comparison: None.  Findings: Age appropriate atrophy.  Mild chronic microvascular ischemia.  Mild hyperdensity along the falx may represent a small inter hemispheric subdural hematoma.  No other hemorrhage.  No mass effect or midline shift.  Negative for acute infarct, mass, or skull fracture.  Left parietal scalp hematoma.  IMPRESSION:   Probable small interhemispheric subdural hematoma.  Otherwise negative   Original Report Authenticated By: Janeece Riggers, M.D.    Review of Systems: The patient denies anorexia, fever, weight loss,, vision loss, decreased hearing, hoarseness, chest pain, syncope, dyspnea on exertion, peripheral edema, balance deficits, hemoptysis, abdominal pain, melena, hematochezia, severe indigestion/heartburn, hematuria, incontinence, genital sores, muscle weakness, suspicious skin lesions, transient blindness, difficulty walking, depression, unusual weight change, abnormal bleeding, enlarged lymph nodes, angioedema, and breast masses.    Blood pressure 104/59, pulse 75, temperature 98 F (36.7 C), temperature source Oral, resp. rate 19, height 5\' 1"  (1.549 m), weight 64.864 kg (143 lb), SpO2 92.00%. PE AAOx2++ , FC all 4 , no drift  , CN II-XII intact  Assessment/Plan: Pt with small amount blood layering on falx - no mass effect  - agree with admission for observation and reversing coumadin  - repeat CT in am  - doubt any neurosurgical intervention needed - will follow until next scan  Clydene Fake, MD 04/30/2013, 6:27 AM

## 2013-04-30 NOTE — Progress Notes (Signed)
Patient's son in law Greggory Stallion called inquiring about patient however patient did not give me permission to talk about her medical care to him. She DID give me permission to talk to her two daughters about her care if they called (toni and cheryl).

## 2013-04-30 NOTE — Progress Notes (Signed)
Patient's daughter Sheralyn Boatman called and said that her mom had been on lithium for 25 years and that her psychiatrist had stopped the lithium a few months ago and replaced it with another medicine that she cant recall the name of. She says that the family has been unhappy with this med change, that since then, the patients physical and mental abilities have appeared to decline. She said she would try to find the name of the medicine that was recently started.Marland KitchenMarland KitchenMarland Kitchen

## 2013-04-30 NOTE — Progress Notes (Signed)
Overall stable no new issues. No headache. Neurologically stable. Continue current management.

## 2013-04-30 NOTE — Progress Notes (Signed)
Second bag of FFP finished at 0855. Pt tolerated well

## 2013-04-30 NOTE — Progress Notes (Signed)
[facility] PROGRESS NOTE  Michaela Horton OZH:[id number] DOB: [birth year] DOA: 04/29/2013 PCP: [name], MD   Brief narrative: 72 y.o. female with h/o PE in 2011 on warfarin, severe Pulm HTN, Chronic resp failure on 4L home O2, MGUS, and schizoaffective disorder had a fall while getting out of bed, without any  syncope, dizziness, CP or palpitations.   she hit her head and subsequently came to the ER for further evaluation.  In ER, noted to have INR of 2.4 and CT head with small Subdural hematoma, ED  Physician discussed with neurosurgery who recommended admission to [facility], reversal of INR and  Follow up head CT and observe.  Assessment/Plan: Small Subdural hematoma  -on anticoagulation with warfarin  -dced  Warfarin, Vitamin K IV x1 and 2Units FFP given -Neuro checks  -CT head in am - Neurosurgery consulted,recommed to monitor no surgical intervention needed.   Chronic thromboembolic Pulm HTN, h/o PE in 2011  -continue Adempas and Bosentan . Adempas not available in the pharmacy. i have asked her to call her family to bring it. i have asked pharmacy to check  if other local pharmacy may have it. -hold warfarin  -h/o IVC filter ( seen on abd x ray)  3. Chronic resp failure  -continue home O2   4. Schizoaffective disorder  -continue risperdone at home dose    5. Hypothyroidism  -continue synthroid   6. MGUS  -on surveillance per dr.Murinson      Code Status: full code Family Communication: none at bedside  Disposition Plan: home once stable   Consultants:  neurosurgery  Procedures:  none  Antibiotics:  none  HPI/Subjective: Patient seen and examined. Denies any symptoms. Reported having rt hip pain to the nurse for which a rt hip xray was done and was unremarkable.   Objective: Filed Vitals:   04/30/13 1433  BP: 90/43  Pulse: 83  Temp: 99 F (37.2 C)  Resp: 20    Intake/Output Summary (Last 24 hours) at 04/30/13 1442 Last data  filed at 04/30/13 1432  Gross per 24 hour  Intake     25 ml  Output      3 ml  Net     22 ml   Filed Weights   04/29/13 2300  Weight: 64.864 kg (143 lb)    Exam:   General: elderly female in NAD   HEENT: no pallor, moist oral mucosa, no scalp hematoma or laceration  Chest: clear to auscultation b/l, no added sounds  CVS: NS1&S2, no murmurs, rubs or gallop  Abd: soft, NT, ND, BS+  Ext: warm, no edema  CNS: AAOX3  Data Reviewed: Basic Metabolic Panel:  Recent Labs Lab 04/29/13 1655 04/29/13 2021 04/30/13 1140  NA 141 140 139  K 3.5 3.4* 3.9  CL 100 103 102  CO2  --  31 29  GLUCOSE 98 90 89  BUN 15 14 13   CREATININE 1.50* 1.27* 1.10  CALCIUM  --  9.1 9.4   Liver Function Tests:  Recent Labs Lab 04/29/13 2021  AST 18  ALT 18  ALKPHOS 37*  BILITOT 0.4  PROT 7.2  ALBUMIN 3.2*   No results found for this basename: LIPASE, AMYLASE,  in the last 168 hours No results found for this basename: AMMONIA,  in the last 168 hours CBC:  Recent Labs Lab 04/29/13 1655 04/29/13 2021 04/30/13 1140  WBC  --  2.8* 3.1*  NEUTROABS  --  1.1*  --   HGB 12.9 12.5 11.7*  HCT 38.0 37.8 35.0*  MCV  --  94.0 92.6  PLT  --  189 156   Cardiac Enzymes: No results found for this basename: CKTOTAL, CKMB, CKMBINDEX, TROPONINI,  in the last 168 hours BNP (last 3 results) No results found for this basename: PROBNP,  in the last 8760 hours CBG: No results found for this basename: GLUCAP,  in the last 168 hours  No results found for this or any previous visit (from the past 240 hour(s)).   Studies: Dg Hip Complete Right  04/30/2013   *RADIOLOGY REPORT*  Clinical Data: Right-sided hip pain.  RIGHT HIP - COMPLETE 2+ VIEW  Comparison: No priors.  Findings: AP view of the pelvis and AP and lateral views of the right hip demonstrate no acute displaced fracture, subluxation or dislocation.  Mild degenerative changes of osteoarthritis are noted the hip joints bilaterally.   IMPRESSION: 1.  No acute radiographic abnormality of the bony pelvis or the right hip.   Original Report Authenticated By: Trudie Reed, M.D.   Dg Abd 1 View  04/30/2013   *RADIOLOGY REPORT*  Clinical Data: Confirm the presence of an IVC filter.  ABDOMEN - 1 VIEW  Comparison: No priors.  Findings: IVC filter is noted with tip projecting over the inferior endplate of L2.  Numerous gas filled loops of small bowel are noted throughout the central abdomen, without pathologic distension of small bowel.  Gas and stool is noted throughout the colon including the distal rectum.  No definite pneumoperitoneum.  Numerous pelvic phleboliths are incidentally noted.  IMPRESSION: IVC filter in position with tip terminating at the level of the inferior endplate of L2.   Original Report Authenticated By: Trudie Reed, M.D.   Ct Head Wo Contrast  04/29/2013   *RADIOLOGY REPORT*  Clinical Data: Fall  CT HEAD WITHOUT CONTRAST  Technique:  Contiguous axial images were obtained from the base of the skull through the vertex without contrast.  Comparison: None.  Findings: Age appropriate atrophy.  Mild chronic microvascular ischemia.  Mild hyperdensity along the falx may represent a small inter hemispheric subdural hematoma.  No other hemorrhage.  No mass effect or midline shift.  Negative for acute infarct, mass, or skull fracture.  Left parietal scalp hematoma.  IMPRESSION:   Probable small interhemispheric subdural hematoma.  Otherwise negative   Original Report Authenticated By: Janeece Riggers, M.D.    Scheduled Meds: . B-complex with vitamin C  1 tablet Oral q morning - 10a  . bosentan  125 mg Oral BID  . calcium-vitamin D  1 tablet Oral Q breakfast  . furosemide  20 mg Intravenous Once  . lamoTRIgine  150 mg Oral q morning - 10a  . levothyroxine  112 mcg Oral QAC breakfast  . multivitamin with minerals  1 tablet Oral q morning - 10a  . [START ON 05/01/2013] pneumococcal 23 valent vaccine  0.5 mL Intramuscular  Tomorrow-1000  . Riociguat  2.5 mg Oral TID  . risperiDONE  1 mg Oral Daily  . risperiDONE  2 mg Oral QHS  . vitamin C  500 mg Oral q morning - 10a   Continuous Infusions:     Time spent: 25 MINUTES    Eddie North  [facility] Pager [phone] If 7PM-7AM, please contact night-coverage at [email or web address], password [facility] 04/30/2013, 2:42 PM  LOS: 1 day

## 2013-04-30 NOTE — Progress Notes (Signed)
Spoke to pharmacy, [facility] does not stock Adempas in the pharmacy here. Patient's family is on vacation/family reunion so they are unable to bring this med. MD paged with this info

## 2013-04-30 NOTE — Progress Notes (Signed)
Patient's home supply of Adenpas now in pharmacy

## 2013-05-01 ENCOUNTER — Inpatient Hospital Stay (HOSPITAL_COMMUNITY): Payer: Medicare Other

## 2013-05-01 DIAGNOSIS — N179 Acute kidney failure, unspecified: Secondary | ICD-10-CM | POA: Diagnosis present

## 2013-05-01 DIAGNOSIS — E876 Hypokalemia: Secondary | ICD-10-CM

## 2013-05-01 LAB — PREPARE FRESH FROZEN PLASMA: Unit division: 0

## 2013-05-01 MED ORDER — RIOCIGUAT 2.5 MG PO TABS
2.5000 mg | ORAL_TABLET | Freq: Three times a day (TID) | ORAL | Status: DC
  Administered 2013-05-01: 2.5 mg via ORAL

## 2013-05-01 NOTE — Discharge Summary (Addendum)
Physician Discharge Summary  Michaela Horton ZOX:[id number] DOB: Jun 05, 1941 DOA: 04/29/2013  PCP: [name], MD  Admit date: 04/29/2013 Discharge date: 05/01/2013  Time spent: 40 minutes  Recommendations for Outpatient Follow-up:  Discharge home with  outpatient followup with PCP and pulmonary.  Coumadin has been discontinued and plan to resume in the future should be be addressed by her  Pulmonologist.  Discharge Diagnoses:  Principal Problem:   Subdural hematoma, post-traumatic  Active Problems:   Chronic thromboembolic pulmonary hypertension   HYPOTHYROIDISM   MONOCLONAL GAMMOPATHY   SCHIZOAFFECTIVE DISORDER   Chronic respiratory failure Acute kidney injury Hypokalemia   Discharge Condition: Fair  Diet recommendation: Regular  Filed Weights   04/29/13 2300  Weight: 64.864 kg (143 lb)    History of present illness:  72 y.o. female with h/o PE in 2011 on warfarin, severe Pulm HTN, Chronic resp failure on 4L home O2, MGUS, and schizoaffective disorder had a fall while getting out of bed, without any syncope, dizziness, CP or palpitations.  she hit her head and subsequently came to the ER for further evaluation.  In ER, noted to have INR of 2.4 and CT head with small Subdural hematoma, ED Physician discussed with neurosurgery who recommended admission to [facility], reversal of INR and Follow up head CT and observe.   Hospital Course:  Small Subdural hematoma  -on anticoagulation with warfarin  -dced Warfarin, Vitamin K IV x1 and 2Units FFP given  -No neurological symptoms -EKG repeated this morning shows no worsening of the subdural hematoma - No surgical intervention per neurosurgery and okay to be discharged home  Chronic thromboembolic Pulm HTN, h/o PE in 2011  -continue Adempas and Bosentan .  -Discontinue warfarin. She needs to followup with her pulmonologist as outpatient and decide on resuming warfarin in future. -h/o IVC filter ( seen on abd x ray)    Acute kidney injury Likely in the setting of dehydration. Resolved with IV fluids  Hypokalemia.  Replenished  Chronic resp failure  -continue home O2    Schizoaffective disorder  -continue risperdone at home dose   Hypothyroidism  -continue synthroid    MGUS  -on surveillance per dr.Murinson    patient clinically stable to be discharged home with outpatient followup with her PCP and pulmonary.  Code Status: full code  Family Communication: none at bedside  Disposition Plan: home   Consultants:  Neurosurgery   Procedures:  none Antibiotics:  none    Discharge Exam: Filed Vitals:   05/01/13 0636  BP: 92/56  Pulse: 62  Temp: 99 F (37.2 C)  Resp: 18   General: elderly female in NAD  HEENT: no pallor, moist oral mucosa, no scalp hematoma or laceration Chest: clear to auscultation b/l, no added sounds  CVS: NS1&S2, no murmurs, rubs or gallop  Abd: soft, NT, ND, BS+  Ext: warm, no edema  CNS: AAOX3   Discharge Instructions   Future Appointments Provider Department Dept Phone   06/09/2013 9:20 AM [facility] [place]  IMAGING [phone]   Patient should wear two piece clothing and wear no powder or deodorant. Patient should arrive 15 minutes early.   07/13/2013 9:30 AM [facility] MEDICAL ONCOLOGY [phone]   07/13/2013 10:00 AM [facility] MEDICAL ONCOLOGY [phone]       Medication List    STOP taking these medications       warfarin 7.5 MG tablet  Commonly known as:  COUMADIN      TAKE these medications       ADEMPAS 2.5 MG Tabs  Generic drug:  Riociguat  Take 2.5 mg by mouth 3 (three) times daily.     B-complex with vitamin C tablet  Take 1 tablet by mouth every morning.     bosentan 125 MG tablet  Commonly known as:  TRACLEER  Take 125 mg by mouth 2 (two) times daily.     calcium-vitamin D 500-200 MG-UNIT per tablet  Commonly  known as:  OSCAL WITH D  Take 1 tablet by mouth daily with breakfast.     furosemide 20 MG tablet  Commonly known as:  LASIX  Take 50 mg by mouth every morning.     guaifenesin 100 MG/5ML syrup  Commonly known as:  ROBITUSSIN  Take 200 mg by mouth 3 (three) times daily as needed for cough or congestion.     lamoTRIgine 100 MG tablet  Commonly known as:  LAMICTAL  Take 150 mg by mouth every morning.     levothyroxine 112 MCG tablet  Commonly known as:  SYNTHROID, LEVOTHROID  Take 1 tablet by mouth daily before breakfast.     LORazepam 0.5 MG tablet  Commonly known as:  ATIVAN  Take 0.5 mg by mouth daily as needed for anxiety.     multivitamin with minerals Tabs tablet  Take 1 tablet by mouth every morning.     risperiDONE 2 MG tablet  Commonly known as:  RISPERDAL  Take 1-2 mg by mouth 2 (two) times daily. Take 1/2 tablet in the morning and 1 tablet at night     vitamin C 500 MG tablet  Commonly known as:  ASCORBIC ACID  Take 500 mg by mouth every morning.       No Known Allergies     Follow-up Information   Follow up with [name], MD In 1 week.   Specialty:  Family Medicine   Contact information:   [address] [place] [address] [phone]       Follow up with Sandrea Hughs, MD. Schedule an appointment as soon as possible for a visit in 2 weeks.   Specialty:  Pulmonary Disease   Contact information:   [address] [place] [address] [phone]        The results of significant diagnostics from this hospitalization (including imaging, microbiology, ancillary and laboratory) are listed below for reference.    Significant Diagnostic Studies: Dg Hip Complete Right  04/30/2013   *RADIOLOGY REPORT*  Clinical Data: Right-sided hip pain.  RIGHT HIP - COMPLETE 2+ VIEW  Comparison: No priors.  Findings: AP view of the pelvis and AP and lateral views of the right hip demonstrate no acute displaced fracture, subluxation or  dislocation.  Mild degenerative changes of osteoarthritis are noted the hip joints bilaterally.  IMPRESSION: 1.  No acute radiographic abnormality of the bony pelvis or the right hip.   Original Report Authenticated By: Trudie Reed, M.D.   Dg Abd 1 View  04/30/2013   *RADIOLOGY REPORT*  Clinical Data: Confirm the presence of an IVC filter.  ABDOMEN - 1 VIEW  Comparison: No priors.  Findings: IVC filter is noted with tip projecting over the inferior endplate of L2.  Numerous gas filled loops of small bowel are noted throughout the central abdomen, without pathologic distension of small bowel.  Gas and stool is noted throughout the colon including the distal rectum.  No definite pneumoperitoneum.  Numerous pelvic  phleboliths are incidentally noted.  IMPRESSION: IVC filter in position with tip terminating at the level of the inferior endplate of L2.   Original Report Authenticated By: Trudie Reed, M.D.   Ct Head Wo Contrast  05/01/2013   *RADIOLOGY REPORT*  Clinical Data: Subdural hematoma.  CT HEAD WITHOUT CONTRAST  Technique:  Contiguous axial images were obtained from the base of the skull through the vertex without contrast.  Comparison: 04/29/2013.  Findings:  No acute osseous or soft tissue changes.  A left parietal scalp contusion has diminished.  Thin subdural hematoma along the posterior and mid falx shows no clear increase.  There is no related mass effect or shift.  Remote left superior cerebellar infarction.  Cerebral volume loss, globally.  No evidence of acute ischemia, hydrocephalus, or other acute change.  IMPRESSION: Size stable thin falcine subdural hematoma.  No related shift or mass effect.   Original Report Authenticated By: Tiburcio Pea   Ct Head Wo Contrast  04/29/2013   *RADIOLOGY REPORT*  Clinical Data: Fall  CT HEAD WITHOUT CONTRAST  Technique:  Contiguous axial images were obtained from the base of the skull through the vertex without contrast.  Comparison: None.  Findings:  Age appropriate atrophy.  Mild chronic microvascular ischemia.  Mild hyperdensity along the falx may represent a small inter hemispheric subdural hematoma.  No other hemorrhage.  No mass effect or midline shift.  Negative for acute infarct, mass, or skull fracture.  Left parietal scalp hematoma.  IMPRESSION:   Probable small interhemispheric subdural hematoma.  Otherwise negative   Original Report Authenticated By: Janeece Riggers, M.D.    Microbiology: No results found for this or any previous visit (from the past 240 hour(s)).   Labs: Basic Metabolic Panel:  Recent Labs Lab 04/29/13 1655 04/29/13 2021 04/30/13 1140  NA 141 140 139  K 3.5 3.4* 3.9  CL 100 103 102  CO2  --  31 29  GLUCOSE 98 90 89  BUN 15 14 13   CREATININE 1.50* 1.27* 1.10  CALCIUM  --  9.1 9.4   Liver Function Tests:  Recent Labs Lab 04/29/13 2021  AST 18  ALT 18  ALKPHOS 37*  BILITOT 0.4  PROT 7.2  ALBUMIN 3.2*   No results found for this basename: LIPASE, AMYLASE,  in the last 168 hours No results found for this basename: AMMONIA,  in the last 168 hours CBC:  Recent Labs Lab 04/29/13 1655 04/29/13 2021 04/30/13 1140  WBC  --  2.8* 3.1*  NEUTROABS  --  1.1*  --   HGB 12.9 12.5 11.7*  HCT 38.0 37.8 35.0*  MCV  --  94.0 92.6  PLT  --  189 156   Cardiac Enzymes: No results found for this basename: CKTOTAL, CKMB, CKMBINDEX, TROPONINI,  in the last 168 hours BNP: BNP (last 3 results) No results found for this basename: PROBNP,  in the last 8760 hours CBG: No results found for this basename: GLUCAP,  in the last 168 hours     Signed:  Ary Rudnick  [facility] 05/01/2013, 11:19 AM

## 2013-05-01 NOTE — Progress Notes (Signed)
DC instructions given with daughter at bedside. Reviewed patient's meds with dtr as per dtr's request. No concerns voiced.  Pt awaiting rolling walker. Vwilliams,rn.

## 2013-05-01 NOTE — Progress Notes (Signed)
Son returned o2 tank,  used by patient for transport home, to unit. Called received from [employer] operational officer about this. Vwilliams,rn.

## 2013-05-01 NOTE — Progress Notes (Signed)
Pt discharged to home. Left unit in wheelchair pushed by nurse tech accompanied by son. Left in good condition. Portable O2 brought in by [facility] that pt used for transport home. Vwilliams,rn.

## 2013-05-01 NOTE — Evaluation (Signed)
Physical Therapy Evaluation Patient Details Name: Michaela Horton MRN: [id number] DOB: 01/23/1941 Today's Date: 05/01/2013 Time: 0950-1056 PT Time Calculation (min): 66 min  PT Assessment / Plan / Recommendation History of Present Illness  72 y.o. female with h/o PE in 2011 on warfarin, severe Pulm HTN, Chronic resp failure on 4L home O2, MGUS, and schizoaffective disorder had a fall while getting out of bed, without any  syncope, dizziness, CP or palpitations.  Fell at home. she hit her head and subsequently came to the ER for further evaluation.  In ER, noted to have INR of 2.4 and CT head with small Subdural hematoma,   Clinical Impression  Presents to PT with below impairments impacting safety with mobility. Will benefit physical therapy in the acute setting to maximize safety using AD for d/c home. Patient responded well to gait training using RW and appears agreeable to this. Will need RW for home use.     PT Assessment  Patient needs continued PT services    Follow Up Recommendations  Home health PT;Supervision for mobility/OOB    Does the patient have the potential to tolerate intense rehabilitation      Barriers to Discharge Decreased caregiver support      Equipment Recommendations  Rolling walker with 5" wheels    Recommendations for Other Services     Frequency Min 3X/week    Precautions / Restrictions Precautions Precautions: Fall Restrictions Weight Bearing Restrictions: No   Pertinent Vitals/Pain Denies pain      Mobility  Bed Mobility Bed Mobility: Supine to Sit Supine to Sit: 6: Modified independent (Device/Increase time) Sitting - Scoot to Edge of Bed: 7: Independent Transfers Transfers: Sit to Stand;Stand to Sit Sit to Stand: 5: Supervision Stand to Sit: 5: Supervision Details for Transfer Assistance: supervision for safety due to weakness Ambulation/Gait Ambulation/Gait Assistance: 4: Min guard;5: Supervision Ambulation Distance (Feet): 600  Feet Ambulation/Gait Assistance Details: ambulates with slow pace, lateral gait disturbance and decreased speed noted when challenged dynamically with head turns or obstacles requiring minA for stability (ambulated approx 200 ft without AD), attempted gait training with SPC to improve stability however this appeared to add to her risk for falls as opposed to helping it as pt with difficulty sequencing and even more slowed pace, lost balance x2 needing assist to correct, pt then agreeable to use of RW which improved her stability to supervision level, speed increased, cues for safe technique with RW and forward gaze, no LOB with RW  Gait Pattern: Step-through pattern Gait velocity: decreased, below 1.8 ft/sec making her at risk for falls General Gait Details: slow speed with wider BOS Stairs: No        PT Diagnosis: Abnormality of gait;Generalized weakness  PT Problem List: Decreased strength;Decreased balance;Decreased activity tolerance;Decreased mobility;Decreased knowledge of use of DME PT Treatment Interventions: DME instruction;Gait training;Functional mobility training;Therapeutic activities;Therapeutic exercise;Balance training;Patient/family education     PT Goals(Current goals can be found in the care plan section) Acute Rehab PT Goals Patient Stated Goal: home PT Goal Formulation: With patient Time For Goal Achievement: 05/08/13 Potential to Achieve Goals: Good  Visit Information  Last PT Received On: 05/01/13 Assistance Needed: +1 History of Present Illness: 72 y.o. female with h/o PE in 2011 on warfarin, severe Pulm HTN, Chronic resp failure on 4L home O2, MGUS, and schizoaffective disorder had a fall while getting out of bed, without any  syncope, dizziness, CP or palpitations.  Fell at home. she hit her head and subsequently came to the ER  for further evaluation.  In ER, noted to have INR of 2.4 and CT head with small Subdural hematoma,        Prior Functioning  Home  Living Family/patient expects to be discharged to:: Private residence Living Arrangements: Alone Available Help at Discharge: Family;Available PRN/intermittently Type of Home: House Home Access: Level entry Home Layout: One level Home Equipment: Cane - single point Additional Comments: has a cane and only used it once per patient Prior Function Level of Independence: Independent Communication Communication: No difficulties Dominant Hand: Right    Cognition  Cognition Arousal/Alertness: Awake/alert Behavior During Therapy: WFL for tasks assessed/performed Overall Cognitive Status: Within Functional Limits for tasks assessed    Extremity/Trunk Assessment Upper Extremity Assessment Upper Extremity Assessment: Defer to OT evaluation Lower Extremity Assessment Lower Extremity Assessment: Generalized weakness Cervical / Trunk Assessment Cervical / Trunk Assessment: Normal   Balance Balance Balance Assessed: Yes Dynamic Standing Balance Dynamic Standing - Balance Support: No upper extremity supported Dynamic Standing - Level of Assistance: 5: Stand by assistance Dynamic Standing - Balance Activities: Reaching for objects Dynamic Standing - Comments: stood x 10 minutes performing activities with upper extremities requirign weight shift and mini squats, no LOB  End of Session PT - End of Session Equipment Utilized During Treatment: Gait belt Activity Tolerance: Patient tolerated treatment well Patient left: in chair;with call bell/phone within reach;with family/visitor present Nurse Communication: Mobility status  GP     Moody Robben H 05/01/2013, 11:18 AM

## 2013-05-01 NOTE — Progress Notes (Signed)
05/01/13 Spoke with patient about HHPT. She chose [facility] from [employer]. Patient has home O2 with [facility].Contacted Dquan Cortopassi with [facility] and set up HHPT. Contacted Darian with [facility], rolling walker brought to patient prior to d/c. Jacquelynn Cree RN, BSN, CCM

## 2013-05-01 NOTE — Evaluation (Signed)
Occupational Therapy Evaluation Patient Details Name: Michaela Horton MRN: [id number] DOB: 1941/02/23 Today's Date: 05/01/2013 Time: 7829-5621 OT Time Calculation (min): 46 min  OT Assessment / Plan / Recommendation History of present illness 72 y.o. female with h/o PE in 2011 on warfarin, severe Pulm HTN, Chronic resp failure on 4L home O2, MGUS, and schizoaffective disorder had a fall while getting out of bed, without any  syncope, dizziness, CP or palpitations.  Fell at home. she hit her head and subsequently came to the ER for further evaluation.  In ER, noted to have INR of 2.4 and CT head with small Subdural hematoma,    Clinical Impression   Pt progressing well with therapy. Pt could benefit from [facility] for balance. OT to sign off acutely.     OT Assessment  All further OT needs can be met in the next venue of care    Follow Up Recommendations  Home health OT    Barriers to Discharge      Equipment Recommendations  None recommended by OT    Recommendations for Other Services    Frequency       Precautions / Restrictions Precautions Precautions: Fall   Pertinent Vitals/Pain oxygen 3L 95% nasal cannula     ADL  Eating/Feeding: Independent Where Assessed - Eating/Feeding: Chair Grooming: Wash/dry hands;Wash/dry face;Independent Where Assessed - Grooming: Unsupported standing Upper Body Bathing: Chest;Right arm;Left arm;Abdomen;Modified independent (needed cues to continue) Where Assessed - Upper Body Bathing: Unsupported sit to stand Lower Body Bathing: Modified independent Where Assessed - Lower Body Bathing: Unsupported sit to stand Upper Body Dressing: Modified independent Where Assessed - Upper Body Dressing: Unsupported sit to stand Toilet Transfer: Modified independent Toilet Transfer Method: Sit to Barista: Regular height toilet Tub/Shower Transfer: Modified independent Tub/Shower Transfer Method: Ambulating Transfers/Ambulation Related  to ADLs: Pt ambulates at a slow gait speed and very talkive during ambulation. pt easily distracted and needs redirection to task. Pt with decr speed with cognitive and visual challenges. Pt ambulates pulling an oxygen tubing at home. Pt must use a oxgyen tank during outside and community mobility. ADL Comments: Pt plans to d/c home alone without assistance. Pt states "i have a daughter that is a [employer] and I dont know how she isnt fired yet. She can't even take a blood pressure. I told her she can run errands for me and I will pay her 10 dollars an hour" Pt talking on a tangent and distracted from task at hand. Pt supine without clothing on arrival. Pt insist on taking a bath. Pt educated on sponge bath only option due to heart monitor. Pt perseverating on washing arm pits over and over again. pt reports at home I wash them until it burns when I put on my deodorant. pt very talkive about past events and her talking to god this morning. Pt needed cues to end task and progress with therapy. Pt states okay and returns to washing arm pits. pt completed tub transfer. Pt agreeable at this time to [facility].     OT Diagnosis:    OT Problem List:   OT Treatment Interventions:     OT Goals(Current goals can be found in the care plan section)    Visit Information  Last OT Received On: 05/01/13 Assistance Needed: +1 PT/OT Co-Evaluation/Treatment: Yes History of Present Illness: 72 y.o. female with h/o PE in 2011 on warfarin, severe Pulm HTN, Chronic resp failure on 4L home O2, MGUS, and schizoaffective disorder had a fall while  getting out of bed, without any  syncope, dizziness, CP or palpitations.  Fell at home. she hit her head and subsequently came to the ER for further evaluation.  In ER, noted to have INR of 2.4 and CT head with small Subdural hematoma,        Prior Functioning     Home Living Family/patient expects to be discharged to:: Private residence Living Arrangements: Alone Available Help at  Discharge: Family;Available PRN/intermittently Type of Home: House Home Access: Level entry Home Layout: One level Home Equipment: Cane - single point Additional Comments: has a cane and only used it once per patient Prior Function Level of Independence: Independent Communication Communication: No difficulties Dominant Hand: Right         Vision/Perception Vision - History Baseline Vision: Wears glasses all the time Patient Visual Report: No change from baseline   Cognition  Cognition Arousal/Alertness: Awake/alert Behavior During Therapy: WFL for tasks assessed/performed Overall Cognitive Status: Within Functional Limits for tasks assessed    Extremity/Trunk Assessment Upper Extremity Assessment Upper Extremity Assessment: Defer to OT evaluation Lower Extremity Assessment Lower Extremity Assessment: Generalized weakness Cervical / Trunk Assessment Cervical / Trunk Assessment: Normal     Mobility Bed Mobility Bed Mobility: Supine to Sit Supine to Sit: 6: Modified independent (Device/Increase time) Sitting - Scoot to Edge of Bed: 7: Independent Transfers Sit to Stand: 5: Supervision Stand to Sit: 5: Supervision Details for Transfer Assistance: supervision for safety due to weakness     Exercise     Balance Balance Balance Assessed: Yes Dynamic Standing Balance Dynamic Standing - Balance Support: No upper extremity supported Dynamic Standing - Level of Assistance: 5: Stand by assistance Dynamic Standing - Balance Activities: Reaching for objects Dynamic Standing - Comments: stood x 10 minutes performing activities with upper extremities requirign weight shift and mini squats, no LOB   End of Session OT - End of Session Activity Tolerance: Patient tolerated treatment well Patient left: in chair;with call bell/phone within reach;with family/visitor present Nurse Communication: Mobility status;Precautions  GO     Boone Master B 05/01/2013, 10:54 AM Pager:  [phone]

## 2013-05-01 NOTE — Progress Notes (Signed)
Occupational Therapy Discharge Patient Details Name: Mellony Danziger MRN: [id number] DOB: 21-Feb-1941 Today's Date: 05/01/2013 Time: 9562-1308 OT Time Calculation (min): 46 min  Patient discharged from OT services secondary to goals met and no further OT needs identified.  Please see latest therapy progress note for current level of functioning and progress toward goals.    Progress and discharge plan discussed with patient and/or caregiver: Patient/Caregiver agrees with plan  GO     Kenzi, [place] Pager: [phone]  05/01/2013, 10:57 AM

## 2013-05-01 NOTE — Progress Notes (Signed)
Pt received walker for home use. Pt's personal O2 tank not brought in by family for pt to use with transport to home. [facility] to bring in portable tank for pt to use during transport to home. Vwilliams,rn.

## 2013-05-05 ENCOUNTER — Telehealth: Payer: Self-pay | Admitting: Internal Medicine

## 2013-05-05 NOTE — Telephone Encounter (Signed)
I spoke with pt. She stated she needs OV so she can get set up with POC. Pt appt scheduled. Nothing further needed

## 2013-05-06 ENCOUNTER — Emergency Department (HOSPITAL_COMMUNITY): Payer: Medicare Other

## 2013-05-06 ENCOUNTER — Emergency Department (HOSPITAL_COMMUNITY)
Admission: EM | Admit: 2013-05-06 | Discharge: 2013-05-06 | Disposition: A | Discharge status: Home / Self Care | Payer: Medicare Other | Attending: Emergency Medicine | Admitting: Emergency Medicine

## 2013-05-06 ENCOUNTER — Encounter (HOSPITAL_COMMUNITY): Payer: Self-pay | Admitting: Emergency Medicine

## 2013-05-06 DIAGNOSIS — Z8742 Personal history of other diseases of the female genital tract: Secondary | ICD-10-CM | POA: Insufficient documentation

## 2013-05-06 DIAGNOSIS — Z862 Personal history of diseases of the blood and blood-forming organs and certain disorders involving the immune mechanism: Secondary | ICD-10-CM | POA: Insufficient documentation

## 2013-05-06 DIAGNOSIS — F259 Schizoaffective disorder, unspecified: Secondary | ICD-10-CM | POA: Insufficient documentation

## 2013-05-06 DIAGNOSIS — M25551 Pain in right hip: Secondary | ICD-10-CM

## 2013-05-06 DIAGNOSIS — Z86711 Personal history of pulmonary embolism: Secondary | ICD-10-CM | POA: Insufficient documentation

## 2013-05-06 DIAGNOSIS — E039 Hypothyroidism, unspecified: Secondary | ICD-10-CM | POA: Insufficient documentation

## 2013-05-06 DIAGNOSIS — Z87448 Personal history of other diseases of urinary system: Secondary | ICD-10-CM | POA: Insufficient documentation

## 2013-05-06 DIAGNOSIS — M1990 Unspecified osteoarthritis, unspecified site: Secondary | ICD-10-CM | POA: Insufficient documentation

## 2013-05-06 DIAGNOSIS — M25559 Pain in unspecified hip: Secondary | ICD-10-CM | POA: Insufficient documentation

## 2013-05-06 DIAGNOSIS — Z98811 Dental restoration status: Secondary | ICD-10-CM | POA: Insufficient documentation

## 2013-05-06 DIAGNOSIS — I2789 Other specified pulmonary heart diseases: Secondary | ICD-10-CM | POA: Insufficient documentation

## 2013-05-06 DIAGNOSIS — Z79899 Other long term (current) drug therapy: Secondary | ICD-10-CM | POA: Insufficient documentation

## 2013-05-06 LAB — URINALYSIS, ROUTINE W REFLEX MICROSCOPIC
Bilirubin Urine: NEGATIVE
Ketones, ur: NEGATIVE mg/dL
Nitrite: NEGATIVE
Protein, ur: NEGATIVE mg/dL
Urobilinogen, UA: 0.2 mg/dL (ref 0.0–1.0)

## 2013-05-06 MED ORDER — ACETAMINOPHEN 500 MG PO TABS
1000.0000 mg | ORAL_TABLET | Freq: Once | ORAL | Status: AC
  Administered 2013-05-06: 1000 mg via ORAL
  Filled 2013-05-06: qty 2

## 2013-05-06 NOTE — ED Notes (Signed)
VS: 122/78, HR 76, RR 18. Per EMS pt wears 02 at home for pulmonary HTN, per EMS pt was not on 02 at home, pt placed on 02 prophylacticly en route. Pt c/o right leg pain that starts at the waist and goes down her leg. Per EMS pt c/o this pain for six month. Pt is A&Ox4.

## 2013-05-06 NOTE — ED Notes (Addendum)
Pt states she does not have any pain at this time. Pt states she has a problem with her children, that her children do not love her the way she wants them to love her. Pt having a conversation with herself about a memory she shared with her family. Pt states she is scheduled to see her psychiatrist on Monday, pt states she is upset that her psychiatrist will not meet with her for more than 15 minutes, pt states she is suppose to have an hour with her.

## 2013-05-06 NOTE — ED Notes (Signed)
PATIENT IS ON THE PHONE. WHEN ASKED WHAT HER COMPLAINT IS TODAY SHE STATES "I PISSED IN THE FLOOR BECAUSE I COULDN'T SIT ON THE TOILET". PT THEN WENT BACK TO HER PHONE CONVERSATION.

## 2013-05-06 NOTE — ED Notes (Signed)
PT DAUGHTER IS NOW AT BEDSIDE. PT IS TALKING AND OCCASIONALLY CURSES AT HER DAUGHTER. PT DAUGHTER ADVISES PT PSYCHIATRIST CHANGED HER PSYCH MEDS ABOUT 3-4 MONTHS AGO AND PT HAS BEEN "MENTALLY DECLINING" SINCE. PT ADMITS SHE HAS BEEN CURSING HER CHILDREN MORE. PT AFFECT INAPPROPRIATE AT TIMES.PT WAS ABLE TO STAND AND SIT ON A BEDSIDE COMMODE TO COLLECT HER URINE SAMPLE. SHE IS UNSTEADY ON HER FEET. HAS HISTORY OF FALL LABOR DAY

## 2013-05-06 NOTE — ED Provider Notes (Signed)
CSN: [id number]     Arrival date & time 05/06/13  0608 History   First MD Initiated Contact with Patient 05/06/13 [phone]     Chief Complaint  Patient presents with  . Leg Pain   (Consider location/radiation/quality/duration/timing/severity/associated sxs/prior Treatment) HPI Comments: Patient is a 72 year old female History of dyspnea, pulmonary embolism, pulmonary hypertension, Hypothyroidism, renal cyst, leukocytopenia, monoclonal gammopathy, osteoarthritis, schizoaffective disorder who presents today with Right hip pain for the past 6 months. She states that this morning it was so bad that she urinated on the floor. It is piercing pain. It radiates from the front of her hip through to the back of her hip. Sometimes the pain radiates down her right leg, but today it is not doing so. She was recently admitted to the hospital for a subdural hematoma. At that time they did an x-ray of her right hip which showed only mild arthritis. She reports that today she would just like her pain controlled and a repeat xray because she does not believe that there is nothing wrong with her hip. She does not take pain medicine per the recommendation of her physicians. She is ambulatory and lives at home alone. She reports that she prefers it this way. She denies urinary symptoms including burning with urination, urinary incontinence, urinary frequency, worsening shortness of breath, chest pain, paresthesias. She does admit that her urine has changed colors to amber.  The history is provided by the patient. No language interpreter was used.    Past Medical History  Diagnosis Date  . Dyspnea   . Pulmonary embolism   . Breast cyst     right  . Hypothyroidism   . Renal cyst   . Leukocytopenia   . Monoclonal gammopathy   . Osteoarthritis   . Schizoaffective disorder   . Wears dentures   . Pulmonary hypertension   . Hypertension     pulmonary   Past Surgical History  Procedure Laterality Date  . Tubal  ligation  1970  . Tubal ligation    . Breast surgery      cyst   Family History  Problem Relation Age of Onset  . Cancer Father     PROSTATE   History  Substance Use Topics  . Smoking status: Never Smoker   . Smokeless tobacco: Never Used  . Alcohol Use: No   OB History   Grav Para Term Preterm Abortions TAB SAB Ect Mult Living                 Review of Systems  Constitutional: Negative for fever and chills.  Respiratory: Negative for shortness of breath.   Genitourinary: Negative for dysuria, urgency, frequency, hematuria and difficulty urinating.  Musculoskeletal: Positive for arthralgias.  All other systems reviewed and are negative.    Allergies  Review of patient's allergies indicates no known allergies.  Home Medications   Current Outpatient Rx  Name  Route  Sig  Dispense  Refill  . ADEMPAS 2.5 MG TABS   Oral   Take 2.5 mg by mouth 3 (three) times daily.          . B Complex-C (B-COMPLEX WITH VITAMIN C) tablet   Oral   Take 1 tablet by mouth every morning.         . bosentan (TRACLEER) 125 MG tablet   Oral   Take 125 mg by mouth 2 (two) times daily.         . calcium-vitamin D (OSCAL WITH D) 500-200 MG-UNIT  per tablet   Oral   Take 1 tablet by mouth daily with breakfast.         . furosemide (LASIX) 20 MG tablet   Oral   Take 50 mg by mouth every morning.          Marland Kitchen guaifenesin (ROBITUSSIN) 100 MG/5ML syrup   Oral   Take 200 mg by mouth 3 (three) times daily as needed for cough or congestion.         Marland Kitchen lamoTRIgine (LAMICTAL) 100 MG tablet   Oral   Take 150 mg by mouth every morning.          Marland Kitchen levothyroxine (SYNTHROID, LEVOTHROID) 112 MCG tablet   Oral   Take 1 tablet by mouth daily before breakfast.          . LORazepam (ATIVAN) 0.5 MG tablet   Oral   Take 0.5 mg by mouth daily as needed for anxiety.          . Multiple Vitamin (MULTIVITAMIN WITH MINERALS) TABS tablet   Oral   Take 1 tablet by mouth every morning.          . risperiDONE (RISPERDAL) 2 MG tablet   Oral   Take 1-2 mg by mouth 2 (two) times daily. Take 1/2 tablet in the morning and 1 tablet at night         . vitamin C (ASCORBIC ACID) 500 MG tablet   Oral   Take 500 mg by mouth every morning.          BP 134/76  Pulse 70  Temp(Src) 98.2 F (36.8 C) (Oral)  Resp 16  Ht 5\' 1"  (1.549 m)  Wt 164 lb (74.39 kg)  BMI 31 kg/m2  SpO2 94% Physical Exam  Nursing note and vitals reviewed. Constitutional: She is oriented to person, place, and time. She appears well-developed and well-nourished. No distress.  HENT:  Head: Normocephalic and atraumatic.  Right Ear: External ear normal.  Left Ear: External ear normal.  Nose: Nose normal.  Mouth/Throat: Oropharynx is clear and moist.  Eyes: Conjunctivae are normal.  Neck: Normal range of motion.  Cardiovascular: Normal rate, regular rhythm, normal heart sounds, intact distal pulses and normal pulses.   Cap refill <3 seconds in toes  Pulmonary/Chest: Effort normal and breath sounds normal. No stridor. No respiratory distress. She has no wheezes. She has no rales.  Abdominal: Soft. She exhibits no distension.  Musculoskeletal: Normal range of motion.  Moves right leg without guarding. Full ROM. Nontender to palpation.   Neurological: She is alert and oriented to person, place, and time. She has normal strength.  Neurovascularly intact.   Skin: Skin is warm and dry. She is not diaphoretic. No erythema.  Psychiatric: She has a normal mood and affect. Her behavior is normal.    ED Course  Procedures (including critical care time) Labs Review Labs Reviewed  URINALYSIS, ROUTINE W REFLEX MICROSCOPIC - Abnormal; Notable for the following:    Leukocytes, UA TRACE (*)    All other components within normal limits  URINE MICROSCOPIC-ADD ON - Abnormal; Notable for the following:    Squamous Epithelial / LPF FEW (*)    All other components within normal limits   Imaging Review Ct Hip Right  Wo Contrast  05/06/2013   *RADIOLOGY REPORT*  Clinical Data: Right hip pain for 6 months.  CT OF THE RIGHT HIP WITHOUT CONTRAST  Technique:  Multidetector CT imaging was performed according to the standard protocol. Multiplanar CT  image reconstructions were also generated.  Comparison: Plain films 04/30/2013 and prior CT 02/25/2012  Findings: Examination demonstrates mild degenerative change of the right hip.  There is no acute fracture or dislocation.  There is subtle hazy density to the fat posterior to the region of the lesser trochanter.  There is no focal fluid collection.  Remainder of the exam is unremarkable.  IMPRESSION: No acute fracture or dislocation.  Subtle hazy density in the fat posterior to the region of the lesser trochanter.  This may be due to a chronic inflammatory condition/bursitis.  Note that MRI is a more sensitive exam for occult fracture and also for evaluation of the hip soft tissues.   Original Report Authenticated By: Elberta Fortis, M.D.    MDM   1. Right hip pain   2. Schizoaffective disorder     Patient with hip pain x 6 months. XR done on 04/30/13 was negative. Will CT hip, collect UA. Dr. Romeo Apple evaluated patient and agrees with plan.   Both CT hip and UA negative for acute pathology. Likely chronic inflammatory condition causing pain. Patient received tylenol in ED. Follow up with PCP. Return instructions given. Vital signs stable for discharge. Patient / Family / Caregiver informed of clinical course, understand medical decision-making process, and agree with plan.     Mora Bellman, PA-C 05/06/13 [phone]

## 2013-05-06 NOTE — ED Notes (Signed)
Pt normally wears O2 on 5L all the time but forgot it. Pt at 84% on room air. Pt placed on O2 at 4L Pt went to 93%. RN notified

## 2013-05-06 NOTE — ED Notes (Signed)
Pt daughter has returned with her oxygen. Pt is ready for discharge

## 2013-05-06 NOTE — ED Provider Notes (Signed)
Medical screening examination/treatment/procedure(s) were conducted as a shared visit with non-physician practitioner(s) and myself.  I personally evaluated the patient during the encounter  I examined and interviewed the pt. Pt notes she was unable to sit this morning d/t right hip pain upon awakening and had to urinate standing up. She has normal rom of right hip w/out focal ttp on exam. Lungs CTAB. Cardiac exam wnl. Will CT hip to r/o occult injury.   Junius Argyle, MD 05/06/13 [phone]

## 2013-05-06 NOTE — ED Notes (Signed)
PT's 02 sats reading 84% on RA, pt placed on 4L 02, pt 02 sats reading 94%. Pt states she wears 4-6L/min at home always.

## 2013-05-11 ENCOUNTER — Ambulatory Visit: Payer: Medicare Other | Admitting: Internal Medicine

## 2013-05-17 ENCOUNTER — Ambulatory Visit: Payer: Medicare Other | Admitting: Internal Medicine

## 2013-05-18 ENCOUNTER — Encounter: Payer: Self-pay | Admitting: Internal Medicine

## 2013-05-18 ENCOUNTER — Ambulatory Visit (INDEPENDENT_AMBULATORY_CARE_PROVIDER_SITE_OTHER): Payer: Medicare Other | Admitting: Internal Medicine

## 2013-05-18 VITALS — BP 120/68 | HR 69 | Temp 97.8°F | Ht 61.0 in | Wt 161.4 lb

## 2013-05-18 DIAGNOSIS — J9610 Chronic respiratory failure, unspecified whether with hypoxia or hypercapnia: Secondary | ICD-10-CM

## 2013-05-18 DIAGNOSIS — I2699 Other pulmonary embolism without acute cor pulmonale: Secondary | ICD-10-CM

## 2013-05-18 DIAGNOSIS — I2789 Other specified pulmonary heart diseases: Secondary | ICD-10-CM

## 2013-05-18 DIAGNOSIS — I2724 Chronic thromboembolic pulmonary hypertension: Secondary | ICD-10-CM

## 2013-05-18 NOTE — Patient Instructions (Addendum)
2lpm  24/7 but increase to 4lpm with any activity  All pulmonary follow up should be through [facility] for now

## 2013-05-18 NOTE — Progress Notes (Signed)
Subjective:    Patient ID: Michaela Horton, female    DOB: 02-Nov-1940    MRN: [id number]     Brief patient profile:  67 yobf never smoker dx'd with PE by CTangiogram 06/24/2010 and referred by Dr Michaela Horton to pulmonary clinic for eval of persistent doe since then with Dx of TEPAH.   History of Present Illness  November 04, 2010 1st pulmonary office eval cc doe x 8 month no better since dx and rx of PE. doe x 50 ft  no assoc cough, mild leg swelling.  desat walking, rec v/q and repeat echo >  Mod to high prob/ severe PAH  12/19/10 ov/Michaela Horton:  Cc no change doe, returns for PFT's (minimal airflow obst with   dlco =51%).   rec You still appear to have blood blots in your lungs 6 months after you started coumadin so you need to stay on this Referred to [facility]/ [facility] clinic > trachleer started       09/24/2011 f/u ov/Michaela Horton cc Unchanged doe- here to recertify for o2 prn with exertion. Sats when arrived 87%ra but then improved to 93 at rest At rest not typically using any 02 but  automatically at bedtime and   On 02 2 lpm can shop at [employer] s decline in ability and thinking about exercise program. No cough or leg swelling rec 02 should be worn automatically at bedtime and for any more activity than walking across a room at home  At 2lpm When really exerting yourself use 4lpm   05/18/2013 f/u ov/Michaela Horton re: 02 recert Chief Complaint  Patient presents with  . Follow-up    Pt states here to see about getting qualified for POC. Pt states that her breathing is doing well. She c/o wheezing and cough for the past 2 months. Cough is non prod.    2 lpm at rest 5 lpm with activity and can still do aisles at HT x 2 then has to rest due to sob/ fatigue No leg swelling or am ha   No obvious day to day or daytime variabilty or assoc chronic cough or cp or chest tightness, subjective wheeze overt sinus or hb symptoms. No unusual exp hx or h/o childhood pna/ asthma or knowledge of premature birth.  Sleeping ok without  nocturnal  or early am exacerbation  of respiratory  c/o's or need for noct saba. Also denies any obvious fluctuation of symptoms with weather or environmental changes or other aggravating or alleviating factors except as outlined above   Current Medications, Allergies, Complete Past Medical History, Past Surgical History, Family History, and Social History were reviewed in [name] record.  ROS  The following are not active complaints unless bolded sore throat, dysphagia, dental problems, itching, sneezing,  nasal congestion or excess/ purulent secretions, ear ache,   fever, chills, sweats, unintended wt loss, pleuritic or exertional cp, hemoptysis,  orthopnea pnd or leg swelling, presyncope, palpitations, heartburn, abdominal pain, anorexia, nausea, vomiting, diarrhea  or change in bowel or urinary habits, change in stools or urine, dysuria,hematuria,  rash, arthralgias, visual complaints, headache, numbness weakness or ataxia or problems with walking or coordination,  change in mood/affect or memory.                Past Medical History:  DYSPNEA (ICD-786.05)      - PFT's  FEV1  1.42 (69%) ratio 65 with DLCO 51%  PULMONARY EMBOLISM, HX OF (ICD-V12.51)  - Dx 05/2010  - Venous dopplers neg 08/19/2010  -  Echocardiogram 11/28/10:  Severely elevated RV sys pressure with dilated R Ht est at 99 with nl LV -VQ  11/05/10  Mod to high prob PE BREAST CYST, RIGHT (ICD-610.0)  EFFUSION, PLEURAL (ICD-511.9)  RENAL CYST, RIGHT (ICD-593.2)  HYPOTHYROIDISM (ICD-244.9)  LEUKOCYTOPENIA UNSPECIFIED (ICD-288.50)  MONOCLONAL GAMMOPATHY (ICD-273.1)  OSTEOARTHRITIS (ICD-715.90)  SCHIZOAFFECTIVE DISORDER (ICD-295.70)    Past Surgical History:  Tubal ligation 1970   Social History:  Divorced  Children  Never smoker  No ETOH  Retired from working at a nursery                  Objective:   Physical Exam  amb bf nad unusual affect, somewhat evasive with responses     wt 177 November 05, 2010  >  173 12/19/2010 > 09/24/2011  179 > 161 05/18/2013   HEENT: nl dentition, turbinates, and orophanx. Nl external ear canals without cough reflex  NECK : without JVD/Nodes/TM/ nl carotid upstrokes bilaterally  LUNGS: no acc muscle use, clear to A and P bilaterally without cough on insp or exp maneuvers  CV: RRR no s3 or murmur , definite increase in P2, no edema  ABD: soft and nontender with nl excursion in the supine position. No bruits or organomegaly, bowel sounds nl  MS: warm without deformities, calf tenderness, cyanosis or clubbing  SKIN: warm and dry without lesions     Assessment & Plan:

## 2013-05-20 ENCOUNTER — Emergency Department (HOSPITAL_BASED_OUTPATIENT_CLINIC_OR_DEPARTMENT_OTHER)
Admission: EM | Admit: 2013-05-20 | Discharge: 2013-05-20 | Disposition: A | Discharge status: Home / Self Care | Payer: Medicare Other | Attending: Emergency Medicine | Admitting: Emergency Medicine

## 2013-05-20 ENCOUNTER — Encounter (HOSPITAL_BASED_OUTPATIENT_CLINIC_OR_DEPARTMENT_OTHER): Payer: Self-pay

## 2013-05-20 DIAGNOSIS — M25476 Effusion, unspecified foot: Secondary | ICD-10-CM | POA: Insufficient documentation

## 2013-05-20 DIAGNOSIS — E039 Hypothyroidism, unspecified: Secondary | ICD-10-CM | POA: Insufficient documentation

## 2013-05-20 DIAGNOSIS — I2789 Other specified pulmonary heart diseases: Secondary | ICD-10-CM | POA: Insufficient documentation

## 2013-05-20 DIAGNOSIS — M25473 Effusion, unspecified ankle: Secondary | ICD-10-CM | POA: Insufficient documentation

## 2013-05-20 DIAGNOSIS — IMO0001 Reserved for inherently not codable concepts without codable children: Secondary | ICD-10-CM | POA: Insufficient documentation

## 2013-05-20 DIAGNOSIS — Z86711 Personal history of pulmonary embolism: Secondary | ICD-10-CM | POA: Insufficient documentation

## 2013-05-20 DIAGNOSIS — M1990 Unspecified osteoarthritis, unspecified site: Secondary | ICD-10-CM | POA: Insufficient documentation

## 2013-05-20 DIAGNOSIS — L819 Disorder of pigmentation, unspecified: Secondary | ICD-10-CM

## 2013-05-20 DIAGNOSIS — Z7982 Long term (current) use of aspirin: Secondary | ICD-10-CM | POA: Insufficient documentation

## 2013-05-20 DIAGNOSIS — Z8742 Personal history of other diseases of the female genital tract: Secondary | ICD-10-CM | POA: Insufficient documentation

## 2013-05-20 DIAGNOSIS — Z79899 Other long term (current) drug therapy: Secondary | ICD-10-CM | POA: Insufficient documentation

## 2013-05-20 DIAGNOSIS — D72819 Decreased white blood cell count, unspecified: Secondary | ICD-10-CM | POA: Insufficient documentation

## 2013-05-20 DIAGNOSIS — L608 Other nail disorders: Secondary | ICD-10-CM | POA: Insufficient documentation

## 2013-05-20 NOTE — ED Notes (Signed)
Patient here with left foot pain, reports that the foot feels cold to her, on exam foot slightly darker than right but warm to touch with positive distal pulses

## 2013-05-20 NOTE — Assessment & Plan Note (Signed)
>   referred to [facility] 12/19/2010  And started on 02 24 h/day    PULMONARY EMBOLISM, HX OF (ICD-V12.51)  - Dx 05/2010  - Venous dopplers neg 08/19/2010  - Echocardiogram 11/28/10:  Severely elevated RV sys pressure with dilated R Ht est at 99 with nl LV -VQ  11/05/10  Mod to high prob PE > referred to [facility]

## 2013-05-20 NOTE — Assessment & Plan Note (Signed)
-   09/24/2011   Walked 2lp x one lap @ 185 stopped due to desat to 88% and no change on 4lpm - RA sat 72% at rest, normalized on 2lpm at rest,  Required 4lpm to walk 185 ft  rec  2lpm 24/7 with 4lpm with any activity

## 2013-05-20 NOTE — ED Provider Notes (Signed)
CSN: [id number]     Arrival date & time 05/20/13  1306 History   First MD Initiated Contact with Patient 05/20/13 1357     Chief Complaint  Patient presents with  . Foot Pain   (Consider location/radiation/quality/duration/timing/severity/associated sxs/prior Treatment) Patient is a 72 y.o. female presenting with lower extremity pain. The history is provided by the patient. No language interpreter was used.  Foot Pain This is a new problem. The current episode started today. The problem occurs constantly. The problem has been gradually worsening. Associated symptoms include joint swelling and myalgias. Nothing aggravates the symptoms. She has tried nothing for the symptoms. The treatment provided no relief.  Pt complains of her left foot looking darker than her right.  Pt denies any pain  Past Medical History  Diagnosis Date  . Dyspnea   . Pulmonary embolism   . Breast cyst     right  . Hypothyroidism   . Renal cyst   . Leukocytopenia   . Monoclonal gammopathy   . Osteoarthritis   . Schizoaffective disorder   . Wears dentures   . Pulmonary hypertension   . Hypertension     pulmonary   Past Surgical History  Procedure Laterality Date  . Tubal ligation  1970  . Tubal ligation    . Breast surgery      cyst   Family History  Problem Relation Age of Onset  . Cancer Father     PROSTATE   History  Substance Use Topics  . Smoking status: Never Smoker   . Smokeless tobacco: Never Used  . Alcohol Use: No   OB History   Grav Para Term Preterm Abortions TAB SAB Ect Mult Living                 Review of Systems  Musculoskeletal: Positive for myalgias and joint swelling.  All other systems reviewed and are negative.    Allergies  Review of patient's allergies indicates no known allergies.  Home Medications   Current Outpatient Rx  Name  Route  Sig  Dispense  Refill  . ADEMPAS 2.5 MG TABS   Oral   Take 2.5 mg by mouth 3 (three) times daily.          Marland Kitchen aspirin  EC 81 MG tablet   Oral   Take 81 mg by mouth at bedtime.         . B Complex-C (B-COMPLEX WITH VITAMIN C) tablet   Oral   Take 1 tablet by mouth daily.          Marland Kitchen bosentan (TRACLEER) 125 MG tablet   Oral   Take 125 mg by mouth 2 (two) times daily.         . calcium-vitamin D (OSCAL WITH D) 500-200 MG-UNIT per tablet   Oral   Take 1 tablet by mouth daily with breakfast.         . cycloSPORINE (RESTASIS) 0.05 % ophthalmic emulsion   Both Eyes   Place 1 drop into both eyes every 12 (twelve) hours.          . furosemide (LASIX) 20 MG tablet   Oral   Take 40 mg by mouth daily.         Marland Kitchen lamoTRIgine (LAMICTAL) 100 MG tablet   Oral   Take 200 mg by mouth daily.          Marland Kitchen levothyroxine (SYNTHROID, LEVOTHROID) 112 MCG tablet   Oral   Take 112 mcg by mouth  daily before breakfast.          . LORazepam (ATIVAN) 0.5 MG tablet   Oral   Take 0.5 mg by mouth daily as needed for anxiety.          . Multiple Vitamin (MULTIVITAMIN WITH MINERALS) TABS tablet   Oral   Take 1 tablet by mouth every morning.         Marland Kitchen OLANZapine (ZYPREXA PO)   Oral   Take 1 tablet by mouth at bedtime.         Marland Kitchen spironolactone (ALDACTONE) 50 MG tablet   Oral   Take 50 mg by mouth daily.          . vitamin C (ASCORBIC ACID) 500 MG tablet   Oral   Take 500 mg by mouth daily.           BP 149/91  Pulse 73  Temp(Src) 98.1 F (36.7 C) (Oral)  Resp 20  Ht 5\' 1"  (1.549 m)  Wt 161 lb (73.029 kg)  BMI 30.44 kg/m2  SpO2 90% Physical Exam  Nursing note and vitals reviewed. Constitutional: She is oriented to person, place, and time. She appears well-developed and well-nourished.  HENT:  Head: Normocephalic.  Cardiovascular: Normal rate.   Pulmonary/Chest: Effort normal.  Abdominal: Soft.  Musculoskeletal: She exhibits no edema and no tenderness.  Slightly darker left foot,  nv and ns intat,  No swelling  Neurological: She is alert and oriented to person, place, and time.  She has normal reflexes.  Skin: Skin is warm.    ED Course  Procedures (including critical care time) Labs Review Labs Reviewed - No data to display Imaging Review No results found.  MDM   1. Discoloration of skin of foot    Pt advised to follow up with her Md for recheck    Elson Areas, PA-C 05/20/13 2350

## 2013-05-21 NOTE — ED Provider Notes (Signed)
Medical screening examination/treatment/procedure(s) were conducted as a shared visit with non-physician practitioner(s) and myself.  I personally evaluated the patient during the encounter  I interviewed and examined the patient. Lungs are CTAB. Cardiac exam wnl. Abdomen soft. Discoloration of foot likely not acute. I suspect it is related to venous stasis. Bilateral LE's w/ 2+ pulses, <2 sec cap refill, normal sensation/motor skills, warm and well perfused.   Junius Argyle, MD 05/21/13 2141

## 2013-05-23 ENCOUNTER — Telehealth (HOSPITAL_COMMUNITY): Payer: Self-pay | Admitting: *Deleted

## 2013-05-23 NOTE — Telephone Encounter (Signed)
Michaela Horton has called [facility]monary [facility] 3 times on 05/22/2013 regarding entrance to the program.  She was given instructions to call her primary MD for order. She has call and left messages that she had told "them" she was thinking about going off her medications and she was told if she did she would die.  She called again this morning and a long conversation with this patient encouraging her to contact one of her children and discuss her present situation regarding her medications and making appointment with a physician.  Attempt made to contact her daughter, unable to reach her, return call to patient and she was on the phone at that time, 8:43 am talking with her daughter Michaela Horton.  We will continue to refer to her physician and family if she calls again.   Dr. Juluis Rainier office was notified of the above, I spoke with [place] and she stated she would give the patient a call. [name] RN

## 2013-06-05 ENCOUNTER — Other Ambulatory Visit: Payer: Self-pay | Admitting: Family Medicine

## 2013-06-05 DIAGNOSIS — S065X9A Traumatic subdural hemorrhage with loss of consciousness of unspecified duration, initial encounter: Secondary | ICD-10-CM

## 2013-06-07 ENCOUNTER — Emergency Department (HOSPITAL_COMMUNITY): Payer: Medicare Other

## 2013-06-07 ENCOUNTER — Emergency Department (HOSPITAL_COMMUNITY)
Admission: EM | Admit: 2013-06-07 | Discharge: 2013-06-07 | Disposition: A | Discharge status: Home / Self Care | Payer: Medicare Other | Attending: Emergency Medicine | Admitting: Emergency Medicine

## 2013-06-07 ENCOUNTER — Encounter (HOSPITAL_COMMUNITY): Payer: Self-pay | Admitting: Emergency Medicine

## 2013-06-07 DIAGNOSIS — F3289 Other specified depressive episodes: Secondary | ICD-10-CM | POA: Insufficient documentation

## 2013-06-07 DIAGNOSIS — F411 Generalized anxiety disorder: Secondary | ICD-10-CM | POA: Insufficient documentation

## 2013-06-07 DIAGNOSIS — Z862 Personal history of diseases of the blood and blood-forming organs and certain disorders involving the immune mechanism: Secondary | ICD-10-CM | POA: Insufficient documentation

## 2013-06-07 DIAGNOSIS — Z8742 Personal history of other diseases of the female genital tract: Secondary | ICD-10-CM | POA: Insufficient documentation

## 2013-06-07 DIAGNOSIS — Z86711 Personal history of pulmonary embolism: Secondary | ICD-10-CM | POA: Insufficient documentation

## 2013-06-07 DIAGNOSIS — I2789 Other specified pulmonary heart diseases: Secondary | ICD-10-CM | POA: Insufficient documentation

## 2013-06-07 DIAGNOSIS — F329 Major depressive disorder, single episode, unspecified: Secondary | ICD-10-CM | POA: Insufficient documentation

## 2013-06-07 DIAGNOSIS — Z79899 Other long term (current) drug therapy: Secondary | ICD-10-CM | POA: Insufficient documentation

## 2013-06-07 DIAGNOSIS — F259 Schizoaffective disorder, unspecified: Secondary | ICD-10-CM | POA: Insufficient documentation

## 2013-06-07 DIAGNOSIS — E039 Hypothyroidism, unspecified: Secondary | ICD-10-CM | POA: Insufficient documentation

## 2013-06-07 DIAGNOSIS — M1990 Unspecified osteoarthritis, unspecified site: Secondary | ICD-10-CM | POA: Insufficient documentation

## 2013-06-07 DIAGNOSIS — R51 Headache: Secondary | ICD-10-CM | POA: Insufficient documentation

## 2013-06-07 LAB — CBC WITH DIFFERENTIAL/PLATELET
Eosinophils Relative: 3 % (ref 0–5)
HCT: 39 % (ref 36.0–46.0)
Hemoglobin: 12.9 g/dL (ref 12.0–15.0)
Lymphocytes Relative: 40 % (ref 12–46)
Lymphs Abs: 1 10*3/uL (ref 0.7–4.0)
MCV: 94.4 fL (ref 78.0–100.0)
Monocytes Absolute: 0.2 10*3/uL (ref 0.1–1.0)
Platelets: 198 10*3/uL (ref 150–400)
RBC: 4.13 MIL/uL (ref 3.87–5.11)
WBC: 2.5 10*3/uL — ABNORMAL LOW (ref 4.0–10.5)

## 2013-06-07 LAB — BASIC METABOLIC PANEL
BUN: 15 mg/dL (ref 6–23)
CO2: 28 mEq/L (ref 19–32)
Calcium: 9.2 mg/dL (ref 8.4–10.5)
Chloride: 106 mEq/L (ref 96–112)
Creatinine, Ser: 1.2 mg/dL — ABNORMAL HIGH (ref 0.50–1.10)
GFR calc Af Amer: 52 mL/min — ABNORMAL LOW (ref >90)

## 2013-06-07 MED ORDER — LORAZEPAM 1 MG PO TABS
1.0000 mg | ORAL_TABLET | Freq: Once | ORAL | Status: AC
  Administered 2013-06-07: 1 mg via ORAL
  Filled 2013-06-07: qty 1

## 2013-06-07 NOTE — ED Provider Notes (Signed)
CSN: [id number]     Arrival date & time 06/07/13  4098 History   First MD Initiated Contact with Patient 06/07/13 [phone]     Chief Complaint  Patient presents with  . Headache   (Consider location/radiation/quality/duration/timing/severity/associated sxs/prior Treatment) HPI  This is a 72 year old female who presents with headache. Upon entering the room, the patient was noted to be very emotional and crying. Patient reports having increased stress at home and "my daughter is driving me crazy."  Patient has a history of schizoaffective disorder. At one point she is taking lithium but has not been on that for a couple of months. She took a lorazepam prior to arrival. EMS was called to the home for headache. Patient reports pressure in her head. She states that she frequently gets headaches and this is similar. It just got worse when she had increasing stress. She denies any focal weakness or numbness or vision changes. Of note, patient was seen approximately one month ago and had a small subdural hematoma following a fall.  She also has a history of pulmonary hypertension and is on her baseline 4 L of home O2. She denies any other pain at this time. She denies any suicidal or homicidal.  Past Medical History  Diagnosis Date  . Dyspnea   . Pulmonary embolism   . Breast cyst     right  . Hypothyroidism   . Renal cyst   . Leukocytopenia   . Monoclonal gammopathy   . Osteoarthritis   . Schizoaffective disorder   . Wears dentures   . Pulmonary hypertension   . Hypertension     pulmonary   Past Surgical History  Procedure Laterality Date  . Tubal ligation  1970  . Tubal ligation    . Breast surgery      cyst   Family History  Problem Relation Age of Onset  . Cancer Father     PROSTATE   History  Substance Use Topics  . Smoking status: Never Smoker   . Smokeless tobacco: Never Used  . Alcohol Use: No   OB History   Grav Para Term Preterm Abortions TAB SAB Ect Mult Living            Review of Systems  Constitutional: Negative for fever.  HENT:       No neck pain  Respiratory: Negative for cough, chest tightness and shortness of breath.   Cardiovascular: Negative for chest pain.  Gastrointestinal: Negative for nausea, vomiting and abdominal pain.  Genitourinary: Negative for dysuria.  Neurological: Positive for headaches. Negative for dizziness, weakness and numbness.  Psychiatric/Behavioral: Negative for suicidal ideas. The patient is nervous/anxious.   All other systems reviewed and are negative.    Allergies  Review of patient's allergies indicates no known allergies.  Home Medications   Current Outpatient Rx  Name  Route  Sig  Dispense  Refill  . ADEMPAS 2.5 MG TABS   Oral   Take 2.5 mg by mouth 3 (three) times daily.          Marland Kitchen aspirin EC 81 MG tablet   Oral   Take 81 mg by mouth at bedtime.         . B Complex-C (B-COMPLEX WITH VITAMIN C) tablet   Oral   Take 2 tablets by mouth daily.          Marland Kitchen bosentan (TRACLEER) 125 MG tablet   Oral   Take 125 mg by mouth 2 (two) times daily.         Marland Kitchen  calcium-vitamin D (OSCAL WITH D) 500-200 MG-UNIT per tablet   Oral   Take 1 tablet by mouth daily.          . cycloSPORINE (RESTASIS) 0.05 % ophthalmic emulsion   Both Eyes   Place 1 drop into both eyes every 12 (twelve) hours.          . furosemide (LASIX) 20 MG tablet   Oral   Take 40 mg by mouth daily.         Marland Kitchen lamoTRIgine (LAMICTAL) 100 MG tablet   Oral   Take 200 mg by mouth at bedtime.          Marland Kitchen levothyroxine (SYNTHROID, LEVOTHROID) 112 MCG tablet   Oral   Take 112 mcg by mouth daily before breakfast.          . LORazepam (ATIVAN) 0.5 MG tablet   Oral   Take 0.5 mg by mouth daily as needed for anxiety.          . Multiple Vitamin (MULTIVITAMIN WITH MINERALS) TABS tablet   Oral   Take 1 tablet by mouth every morning.         Marland Kitchen OLANZapine (ZYPREXA PO)   Oral   Take 1 tablet by mouth at bedtime.          Bertram Gala Glycol-Propyl Glycol (SYSTANE OP)   Both Eyes   Place 1-2 drops into both eyes daily.         Marland Kitchen spironolactone (ALDACTONE) 50 MG tablet   Oral   Take 50 mg by mouth daily.          . vitamin C (ASCORBIC ACID) 500 MG tablet   Oral   Take 500 mg by mouth daily.           BP 123/88  Pulse 75  Temp(Src) 97.8 F (36.6 C) (Oral)  Resp 18  SpO2 98% Physical Exam  Nursing note and vitals reviewed. Constitutional: She is oriented to person, place, and time. She appears well-developed and well-nourished. She appears distressed.  Tearful and anxious appearing, nontoxic  HENT:  Head: Normocephalic and atraumatic.  Mouth/Throat: Oropharynx is clear and moist.  Eyes: Pupils are equal, round, and reactive to light.  Neck: Neck supple.  Cardiovascular: Normal rate, regular rhythm and normal heart sounds.   No murmur heard. Pulmonary/Chest: Effort normal and breath sounds normal. No respiratory distress. She has no wheezes.  Abdominal: Soft. Bowel sounds are normal. She exhibits no distension.  Musculoskeletal: She exhibits no edema.  Neurological: She is alert and oriented to person, place, and time. No cranial nerve deficit. Coordination normal.  5 out of 5 strength in all 4 extremities  Skin: Skin is warm and dry.  Psychiatric: She has a normal mood and affect.    ED Course  Procedures (including critical care time) Labs Review Labs Reviewed  CBC WITH DIFFERENTIAL - Abnormal; Notable for the following:    WBC 2.5 (*)    Neutro Abs 1.3 (*)    All other components within normal limits  BASIC METABOLIC PANEL - Abnormal; Notable for the following:    Creatinine, Ser 1.20 (*)    GFR calc non Af Amer 45 (*)    GFR calc Af Amer 52 (*)    All other components within normal limits  PROTIME-INR   Imaging Review Ct Head Wo Contrast  06/07/2013   CLINICAL DATA:  Throbbing sensation in the head  EXAM: CT HEAD WITHOUT CONTRAST  TECHNIQUE: Contiguous axial images  were obtained from the base of the skull through the vertex without intravenous contrast.  COMPARISON:  05/01/2013  FINDINGS: No skull fracture is noted. Paranasal sinuses and mastoid air cells are unremarkable. No intracranial hemorrhage, mass effect or midline shift. Previous tiny falcine hematoma has resolved. Neural acute cortical infarction. Stable mild cerebral atrophy. Ventricular size is stable from prior exam. No mass lesion is noted on this unenhanced scan.  IMPRESSION: No acute intracranial abnormality. Stable mild cerebral atrophy.   Electronically Signed   By: Natasha Mead M.D.   On: 06/07/2013 11:55    MDM   1. Headache   2. Anxiety and depression    Patient very anxious appearing and tearful initial.  No SI/HI.  Nontoxic appearing and neurologically intact.  Patient given ativan and tylenol.  CT head without recurrence of bleed.  Basic labwork neg.I reevaluated the patient who reports improvement of her headache.  She is no longer tearful and her affect is almost cheerful.  Patient does not wish to have psychiatric evaluation.  Would like to be discharged home.  She will follow-up with her psychiatrist as an outpatient.  After history, exam, and medical workup I feel the patient has been appropriately medically screened and is safe for discharge home. Pertinent diagnoses were discussed with the patient. Patient was given return precautions.    Shon Baton, MD 06/07/13 [phone]

## 2013-06-07 NOTE — ED Notes (Signed)
MD at bedside. 

## 2013-06-07 NOTE — ED Notes (Addendum)
EDP at bedside. Pt tearful. States she came in for HA but is experiencing anxiety as well. States she is under a lot of stress.

## 2013-06-07 NOTE — ED Notes (Signed)
Per EMS - pt coming from home. Called out for HA. Pt sts her daughter Lowella Grip stop talking to her so her HA became worse. Pt had fall on 8/30, hit head, went to [facility], negative CT scan, was given a follow up appt tmrw for another head CT. Pt is on coumadin. BP 178/108, pt started to cry and become very anxious when hearing about this BP. HR 90. BP 116/83. Room air 89%, put on 4 liters came up to 95%, pt normally wears 4 liters at home. Lung sounds clear. No neuro deficits.

## 2013-06-08 ENCOUNTER — Other Ambulatory Visit: Payer: Medicare Other

## 2013-06-08 ENCOUNTER — Inpatient Hospital Stay: Admission: RE | Admit: 2013-06-08 | Payer: Medicare Other | Source: Ambulatory Visit

## 2013-06-09 ENCOUNTER — Ambulatory Visit: Admission: RE | Admit: 2013-06-09 | Payer: Medicare Other | Source: Ambulatory Visit

## 2013-06-09 ENCOUNTER — Ambulatory Visit
Admission: RE | Admit: 2013-06-09 | Discharge: 2013-06-09 | Disposition: A | Discharge status: Home / Self Care | Payer: Medicare Other | Source: Ambulatory Visit

## 2013-06-09 ENCOUNTER — Other Ambulatory Visit: Payer: Self-pay

## 2013-06-09 DIAGNOSIS — Z1231 Encounter for screening mammogram for malignant neoplasm of breast: Secondary | ICD-10-CM

## 2013-06-15 ENCOUNTER — Telehealth: Payer: Self-pay | Admitting: Internal Medicine

## 2013-06-15 NOTE — Telephone Encounter (Signed)
I spoke with pt. She stated she heard [facility] has a concentrator that last 6 hrs. I advised will call [facility] but with her being on 4 liters O2 with activity no tank will last 6 hrs cont. I called and spoke with Melissa and she stated the same thing. Only way something like this will last if she was somewhere to keep charging.   I ATC pt back and someone answered the phone x 4 but never would say anything when I was saying hello. WCB

## 2013-06-16 NOTE — Telephone Encounter (Signed)
Pt advised. [name], CMA  

## 2013-07-13 ENCOUNTER — Telehealth: Payer: Self-pay | Admitting: Internal Medicine

## 2013-07-13 ENCOUNTER — Ambulatory Visit (HOSPITAL_BASED_OUTPATIENT_CLINIC_OR_DEPARTMENT_OTHER): Payer: Medicare Other | Admitting: Internal Medicine

## 2013-07-13 ENCOUNTER — Other Ambulatory Visit: Payer: Self-pay | Admitting: Internal Medicine

## 2013-07-13 ENCOUNTER — Other Ambulatory Visit (HOSPITAL_BASED_OUTPATIENT_CLINIC_OR_DEPARTMENT_OTHER): Payer: Medicare Other | Admitting: Lab

## 2013-07-13 VITALS — BP 101/67 | HR 94 | Temp 97.1°F | Resp 20 | Ht 61.0 in | Wt 154.7 lb

## 2013-07-13 DIAGNOSIS — D72819 Decreased white blood cell count, unspecified: Secondary | ICD-10-CM

## 2013-07-13 DIAGNOSIS — Z800 Family history of malignant neoplasm of digestive organs: Secondary | ICD-10-CM

## 2013-07-13 DIAGNOSIS — D472 Monoclonal gammopathy: Secondary | ICD-10-CM

## 2013-07-13 DIAGNOSIS — I2782 Chronic pulmonary embolism: Secondary | ICD-10-CM

## 2013-07-13 DIAGNOSIS — M810 Age-related osteoporosis without current pathological fracture: Secondary | ICD-10-CM

## 2013-07-13 DIAGNOSIS — I2724 Chronic thromboembolic pulmonary hypertension: Secondary | ICD-10-CM

## 2013-07-13 DIAGNOSIS — Z9981 Dependence on supplemental oxygen: Secondary | ICD-10-CM

## 2013-07-13 DIAGNOSIS — I2789 Other specified pulmonary heart diseases: Secondary | ICD-10-CM

## 2013-07-13 LAB — COMPREHENSIVE METABOLIC PANEL (CC13)
ALT: 15 U/L (ref 0–55)
AST: 18 U/L (ref 5–34)
Albumin: 3.5 g/dL (ref 3.5–5.0)
Alkaline Phosphatase: 47 U/L (ref 40–150)
Creatinine: 1.3 mg/dL — ABNORMAL HIGH (ref 0.6–1.1)
Sodium: 138 mEq/L (ref 136–145)
Total Bilirubin: 0.55 mg/dL (ref 0.20–1.20)
Total Protein: 7.7 g/dL (ref 6.4–8.3)

## 2013-07-13 LAB — TECHNOLOGIST REVIEW

## 2013-07-13 LAB — CBC WITH DIFFERENTIAL/PLATELET
BASO%: 1.2 % (ref 0.0–2.0)
EOS%: 1.1 % (ref 0.0–7.0)
HCT: 40.5 % (ref 34.8–46.6)
LYMPH%: 31.9 % (ref 14.0–49.7)
MCH: 31.2 pg (ref 25.1–34.0)
MCHC: 33.1 g/dL (ref 31.5–36.0)
MCV: 94.4 fL (ref 79.5–101.0)
NEUT%: 58.7 % (ref 38.4–76.8)
Platelets: 214 10*3/uL (ref 145–400)
RBC: 4.29 10*6/uL (ref 3.70–5.45)

## 2013-07-13 NOTE — Telephone Encounter (Signed)
gv and printed appt sched and avs for pt for May 2015 °

## 2013-07-13 NOTE — Patient Instructions (Signed)
MGUS (monoclonal gammopathy of unknown significance)  MGUS is a non-cancerous (benign) condition. Most people with MGUS remain well. However, a small number of people may go on to develop more serious problems, so everyone with the condition has regular tests.  On this page   What is MGUS?  Causes of MGUS  Signs and symptoms of MGUS  Diagnosis of MGUS  Treatment and follow-up for MGUS  Your feelings  Useful organisations  References and thanks   What is MGUS? Back to top MGUS (monoclonal gammopathy of unknown significance) is a condition where the body makes an abnormal protein, called a paraprotein. These paraproteins are found in the blood and urine when they're tested.  MGUS is linked to the immune system. The immune system helps the body fight infection and disease. It is made up of organs such as the bone marrow, the spleen, lymph nodes and white blood cells.  MGUS affects plasma cells. Plasma cells are a type of white blood cell that make antibodies to help fight infections. Antibodies are made from a protein called immunoglobulin.  With MGUS, some plasma cells make an abnormally high number of a type of antibody called a paraprotein (or M-protein). This paraprotein doesn't do anything useful, and for most people it isn't harmful.  Although MGUS is not a cancer, people who have it are at slightly higher risk of certain cancers. The two main cancer types people with MGUS are more at risk of developing are myeloma (cancer of the plasma cells) and lymphoma (cancer of the lymphatic system).  These cancers also produce large amounts of paraproteins. Although the levels of paraprotein are raised in MGUS, they're not as high as the levels in people with cancer.  Most people with MGUS remain well and it causes few problems. Because a small number of people may go on to develop cancer, everyone with MGUS has regular checks.   Causes of MGUS Back to top MGUS is a rare condition that becomes  slightly more common as people get older. The cause is unknown. It's more common in people with conditions that affect the immune system, such as rheumatoid arthritis and certain infections.   Signs and symptoms of MGUS Back to top MGUS is usually found during a blood test carried out for some other reason. It doesn't usually cause any symptoms.  Occasionally, people with MGUS have numbness or tingling in their hands and feet, or problems with their balance. This may be due to damage to nerves (peripheral neuropathy) caused by the paraprotein in the blood.  If these symptoms are troublesome, or get worse, you may be referred to a neurologist (a doctor who specialises in conditions of the nervous system).   Diagnosis of MGUS Back to top You will be usually be seen by a haematologist (a doctor who specialises in treating blood disorders). The haematologist may examine you and ask you questions about your general health.  You will usually have blood and urine tests. Your haematologist may also advise you to have other tests to check for myeloma or lymphoma. These tests may include x-rays, scans and, occasionally, a bone marrow test. The bone marrow is where blood cells are made and develop until they're ready to go into the blood.  Not everyone will need to have these tests and your haematologist will advise you on which are appropriate for you.  Blood tests  You have a blood test called serum protein electrophoresis to diagnose MGUS. This test is also used to  check on MGUS. This test measures the type and amount of paraprotein produced by the plasma cells.  You will also have a test to check the number of different types of blood cells (full blood count). This is to make sure your bone marrow is working well  Your doctor may arrange blood tests to check how well your liver and kidneys are working. You may also have your calcium levels checked, as these can be raised in myeloma.  Urine tests  You will  be asked to give samples of your urine, which will be checked for paraproteins.  X-rays  Some people may have x-rays taken of different bones in the body. This is to check for damage to the bones, which can be caused by myeloma.  CT scan (computerised tomography)  A CT scan takes a series of x-rays, which build up a three-dimensional picture of the inside of the body. It is used to find out if lymph nodes, or organs such as the liver or spleen, are enlarged. The scan takes 10-30 minutes and is painless. It uses a small amount of radiation, which is very unlikely to harm you and will not harm anyone you come into contact with.  You will be asked not to eat or drink for at least four hours before the scan. You may be given a drink or injection of a dye, which allows particular areas to be seen more clearly. This may make you feel hot all over for a few minutes. It's important to let your doctor know if you are allergic to iodine or have asthma, because you could have a more serious reaction to the injection.  Bone marrow sample  In some situations, the haematologist may recommend that a sample of bone marrow is taken (biopsy) to be examined under a microscope.  A doctor or nurse takes a small sample of bone marrow from the back of the hip bone (pelvis). Before the bone marrow sample is taken, you'll be given a local anaesthetic injection to numb the area. You may also be offered a short-acting sedative to reduce any pain or discomfort during the test.  You'll be asked to lie on your side. The doctor or nurse then passes a needle through the skin into the bone and draws a small sample of liquid marrow into a syringe (bone marrow aspirate). It can feel uncomfortable for a few seconds when the liquid marrow is drawn into the syringe. After this, they take a small core of marrow from the bone (a trephine biopsy). A small plaster or dressing is placed over the skin.  You may feel bruised after having a sample of  bone marrow taken, and have an ache for a few days. This can be eased with mild painkillers.  The test is usually done as an outpatient and takes about 15-20 minutes.   Treatment and follow-up for MGUS Back to top MGUS doesn't need treatment as it doesn't usually cause any symptoms. In most people, MGUS remains stable and may never cause any problems. However, because of the small risk of MGUS developing into a cancer, such as myeloma or lymphoma, regular check-ups are important. Always contact your doctor between check-ups if you develop any of the following symptoms:  new constant bone pain in one area (such as in the back, ribs, hip or pelvis)  unexplained weight loss  increasing breathlessness  extreme tiredness (fatigue)  having different infections one after the other, caused by not having enough healthy white  blood cells.  You will usually have a blood test to check your paraprotein levels every 3-4 months for the first year. This can be done by your own GP or your haematologist.  Your doctors will monitor the pattern of the paraprotein levels - whether they stay roughly the same at each check, or are gradually rising. If the paraprotein level remains steady and there are no other problems, the time between your appointments will become longer.  If the paraprotein levels are rising, or if you have symptoms, tests may need to be repeated or new tests may be carried out.       Warfarin tablets What is this medicine? WARFARIN (WAR far in) is an anticoagulant. It is used to treat or prevent clots in the veins, arteries, lungs, or heart. This medicine may be used for other purposes; ask your health care provider or pharmacist if you have questions. COMMON BRAND NAME(S): Coumadin, Jantoven  What should I tell my health care provider before I take this medicine? They need to know if you have any of these conditions: -alcoholism -anemia -bleeding disorders -cancer -diabetes -heart  disease -high blood pressure -history of bleeding in the gastrointestinal tract -history of stroke or other brain injury or disease -kidney or liver disease -protein C deficiency -protein S deficiency -psychosis or dementia -recent injury, recent or planned surgery or procedure -an unusual or allergic reaction to warfarin, other medicines, foods, dyes, or preservatives -pregnant or trying to get pregnant -breast-feeding How should I use this medicine? Take this medicine by mouth with a glass of water. Follow the directions on the prescription label. You can take this medicine with or without food. Take your medicine at the same time each day. Do not take it more often than directed. Do not stop taking except on your doctor's advice. Stopping this medicine may increase your risk of a blood clot. Be sure to refill your prescription before you run out of medicine. If your doctor or healthcare professional calls to change your dose, write down the dose and any other instructions. Always read the dose and instructions back to him or her to make sure you understand them. Tell your doctor or healthcare professional what strength of tablets you have on hand. Ask how many tablets you should take to equal your new dose. Write the date on the new instructions and keep them near your medicine. If you are told to stop taking your medicine until your next blood test, call your doctor or healthcare professional if you do not hear anything within 24 hours of the test to find out your new dose or when to restart your prior dose. A special MedGuide will be given to you by the pharmacist with each prescription and refill. Be sure to read this information carefully each time. Talk to your pediatrician regarding the use of this medicine in children. Special care may be needed. Overdosage: If you think you have taken too much of this medicine contact a poison control center or emergency room at once. NOTE: This medicine  is only for you. Do not share this medicine with others. What if I miss a dose? It is important not to miss a dose. If you miss a dose, call your healthcare provider. Take the dose as soon as possible on the same day. If it is almost time for your next dose, take only that dose. Do not take double or extra doses to make up for a missed dose. What may interact with  this medicine? Do not take this medicine with any of the following medications: -agents that prevent or dissolve blood clots -aspirin or other salicylates -danshen -dextrothyroxine -mifepristone -St. John's Wort -red yeast rice This medicine may also interact with the following medications: -acetaminophen -agents that lower cholesterol -alcohol -allopurinol -amiodarone -antibiotics or medicines for treating bacterial, fungal or viral infections -azathioprine -barbiturate medicines for inducing sleep or treating seizures -certain medicines for diabetes -certain medicines for heart rhythm problems -certain medicines for high blood pressure -chloral hydrate -cisapride -disulfiram -female hormones, including contraceptive or birth control pills -general anesthetics -herbal or dietary products like garlic, ginkgo, ginseng, green tea, or kava kava -influenza virus vaccine -female hormones -medicines for mental depression or psychosis -medicines for some types of cancer -medicines for stomach problems -methylphenidate -NSAIDs, medicines for pain and inflammation, like ibuprofen or naproxen -propoxyphene -quinidine, quinine -raloxifene -seizure or epilepsy medicine like carbamazepine, phenytoin, and valproic acid -steroids like cortisone and prednisone -tamoxifen -thyroid medicine -tramadol -vitamin c, vitamin e, and vitamin K -zafirlukast -zileuton This list may not describe all possible interactions. Give your health care provider a list of all the medicines, herbs, non-prescription drugs, or dietary supplements you  use. Also tell them if you smoke, drink alcohol, or use illegal drugs. Some items may interact with your medicine. What should I watch for while using this medicine? Visit your doctor or health care professional for regular checks on your progress. You will need to have a blood test called a PT/INR regularly. The PT/INR blood test is done to make sure you are getting the right dose of this medicine. It is important to not miss your appointment for the blood tests. When you first start taking this medicine, these tests are done often. Once the correct dose is determined and you take your medicine properly, these tests can be done less often. Notify your doctor or health care professional and seek emergency treatment if you develop breathing problems; changes in vision; chest pain; severe, sudden headache; pain, swelling, warmth in the leg; trouble speaking; sudden numbness or weakness of the face, arm or leg. These can be signs that your condition has gotten worse. While you are taking this medicine, carry an identification card with your name, the name and dose of medicine(s) being used, and the name and phone number of your doctor or health care professional or person to contact in an emergency. Do not start taking or stop taking any medicines or over-the-counter medicines except on the advice of your doctor or health care professional. You should discuss your diet with your doctor or health care professional. Do not make major changes in your diet. Vitamin K can affect how well this medicine works. Many foods contain vitamin K. It is important to eat a consistent amount of foods with vitamin K. Other foods with vitamin K that you should eat in consistent amounts are asparagus, basil, beef or pork liver, black eyed peas, broccoli, brussel sprouts, cabbage, chickpeas, cucumber with peel, green onions, green tea, okra, parsley, peas, thyme, and green leafy vegetables like beet greens, collard greens, endive,  kale, mustard greens, spinach, turnip greens, watercress, or certain lettuces like green leaf or romaine. This medicine can cause birth defects or bleeding in an unborn child. Women of childbearing age should use effective birth control while taking this medicine. If a woman becomes pregnant while taking this medicine, she should discuss the potential risks and her options with her health care professional. Avoid sports and activities that might cause  injury while you are using this medicine. Severe falls or injuries can cause unseen bleeding. Be careful when using sharp tools or knives. Consider using an Neurosurgeon. Take special care brushing or flossing your teeth. Report any injuries, bruising, or red spots on the skin to your doctor or health care professional. If you have an illness that causes vomiting, diarrhea, or fever for more than a few days, contact your doctor. Also check with your doctor if you are unable to eat for several days. These problems can change the effect of this medicine. Even after you stop taking this medicine, it takes several days before your body recovers its normal ability to clot blood. Ask your doctor or health care professional how long you need to be careful. If you are going to have surgery or dental work, tell your doctor or health care professional that you have been taking this medicine. What side effects may I notice from receiving this medicine? Side effects that you should report to your doctor or health care professional as soon as possible: -back pain -chills -dizziness -fever -heavy menstrual bleeding or vaginal bleeding -painful, blue, or purple toes -painful, prolonged erection -signs and symptoms of bleeding such as bloody or black, tarry stools; red or dark-brown urine; spitting up blood or brown material that looks like coffee grounds; red spots on the skin; unusual bruising or bleeding from the eye, gums, or nose-skin rash, itching or skin  damage -stomach pain -unusually weak or tired -yellowing of skin or eyes Side effects that usually do not require medical attention (report to your doctor or health care professional if they continue or are bothersome): -diarrhea -hair loss This list may not describe all possible side effects. Call your doctor for medical advice about side effects. You may report side effects to FDA at 1-800-FDA-1088. Where should I keep my medicine? Keep out of the reach of children. Store at room temperature between 15 and 30 degrees C (59 and 86 degrees F). Protect from light. Throw away any unused medicine after the expiration date. Do not flush down the toilet. NOTE: This sheet is a summary. It may not cover all possible information. If you have questions about this medicine, talk to your doctor, pharmacist, or health care provider.  2014, [employer]. (2013-03-08 12:17:56) Fall Prevention and Home Safety Falls cause injuries and can affect all age groups. It is possible to use preventive measures to significantly decrease the likelihood of falls. There are many simple measures which can make your home safer and prevent falls. OUTDOORS  Repair cracks and edges of walkways and driveways.  Remove high doorway thresholds.  Trim shrubbery on the main path into your home.  Have good outside lighting.  Clear walkways of tools, rocks, debris, and clutter.  Check that handrails are not broken and are securely fastened. Both sides of steps should have handrails.  Have leaves, snow, and ice cleared regularly.  Use sand or salt on walkways during winter months.  In the garage, clean up grease or oil spills. BATHROOM  Install night lights.  Install grab bars by the toilet and in the tub and shower.  Use non-skid mats or decals in the tub or shower.  Place a plastic non-slip stool in the shower to sit on, if needed.  Keep floors dry and clean up all water on the floor  immediately.  Remove soap buildup in the tub or shower on a regular basis.  Secure bath mats with non-slip, double-sided rug tape.  Remove throw rugs and tripping hazards from the floors. BEDROOMS  Install night lights.  Make sure a bedside light is easy to reach.  Do not use oversized bedding.  Keep a telephone by your bedside.  Have a firm chair with side arms to use for getting dressed.  Remove throw rugs and tripping hazards from the floor. KITCHEN  Keep handles on pots and pans turned toward the center of the stove. Use back burners when possible.  Clean up spills quickly and allow time for drying.  Avoid walking on wet floors.  Avoid hot utensils and knives.  Position shelves so they are not too high or low.  Place commonly used objects within easy reach.  If necessary, use a sturdy step stool with a grab bar when reaching.  Keep electrical cables out of the way.  Do not use floor polish or wax that makes floors slippery. If you must use wax, use non-skid floor wax.  Remove throw rugs and tripping hazards from the floor. STAIRWAYS  Never leave objects on stairs.  Place handrails on both sides of stairways and use them. Fix any loose handrails. Make sure handrails on both sides of the stairways are as long as the stairs.  Check carpeting to make sure it is firmly attached along stairs. Make repairs to worn or loose carpet promptly.  Avoid placing throw rugs at the top or bottom of stairways, or properly secure the rug with carpet tape to prevent slippage. Get rid of throw rugs, if possible.  Have an electrician put in a light switch at the top and bottom of the stairs. OTHER FALL PREVENTION TIPS  Wear low-heel or rubber-soled shoes that are supportive and fit well. Wear closed toe shoes.  When using a stepladder, make sure it is fully opened and both spreaders are firmly locked. Do not climb a closed stepladder.  Add color or contrast paint or tape to  grab bars and handrails in your home. Place contrasting color strips on first and last steps.  Learn and use mobility aids as needed. Install an electrical emergency response system.  Turn on lights to avoid dark areas. Replace light bulbs that burn out immediately. Get light switches that glow.  Arrange furniture to create clear pathways. Keep furniture in the same place.  Firmly attach carpet with non-skid or double-sided tape.  Eliminate uneven floor surfaces.  Select a carpet pattern that does not visually hide the edge of steps.  Be aware of all pets. OTHER HOME SAFETY TIPS  Set the water temperature for 120 F (48.8 C).  Keep emergency numbers on or near the telephone.  Keep smoke detectors on every level of the home and near sleeping areas. Document Released: 08/07/2002 Document Revised: 02/16/2012 Document Reviewed: 11/06/2011 [facility] Patient Information 2014 [place], [place].

## 2013-07-14 LAB — IGG, IGA, IGM: IgM, Serum: 20 mg/dL — ABNORMAL LOW (ref 52–322)

## 2013-07-14 NOTE — Progress Notes (Signed)
[facility] OFFICE PROGRESS NOTE  [name], MD [address] [place] [address]  DIAGNOSIS: MONOCLONAL GAMMOPATHY - Plan: DG Bone Survey Met, CBC with Differential, Comprehensive metabolic panel, Kappa/lambda light chains, SPEP & IFE with QIG  Chronic thromboembolic pulmonary hypertension  LEUKOCYTOPENIA UNSPECIFIED - Plan: DG Bone Survey Met, CBC with Differential, Comprehensive metabolic panel  Chief Complaint  Patient presents with  . MONOCLONAL GAMMOPATHY    CURRENT THERAPY: Observation.   INTERVAL HISTORY: Michaela Horton 72 y.o. 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  She was last seen by Dr. Gerarda Fraction. Murinson on 01/09/2013. Today, she is accompanied by her daughter Michaela Horton.    She has 6 ER visits since her last evaluation here.   She was seen in the Emergency room on 06-07-2013 for headache by Dr. Wilkie Aye.  CT head was negative for bleed.  She was discharged to home with follow with psychiatry as an outpatient.  She was seen in the emergency room on 05-20-2013 with left foot discoloration and was instructed to follow up with her primary care physician. She was seen on 05-06-2013 in the Emergency room for right hip pain.  XR was done on 08/31 which was negative.  CT of hip and uranalysis was negative for acute pathology.  She was instructed to take tylenol prn and follow as an outpatient.  On 04/29/2013, she was admitted for a fall that she had while getting out of bed without syncope, dizziness, CP or palpitations.  She hit her head and came to ER.  Her INR was 2.4 and CT head showed a small subdural hematoma.  Her INR was reversed and follow up CT of head performed.  No surgical intervention  per neurosurgery.   On 04/16/2013, she presented to the ER with leg swelling and shortness of breath.  Negative lower extremity doppler was found and her INR was therapeutic.  On 03/25/2013, she presented to the emergency room for worsening insomnia.  She was discharged with zolpidem 5 mg qhs.  She instructed to follow up with her psychiatrist in 10 days.   Regarding her MGUS, she denies any weight changes or bone pain.  She reports recently stopping her lithium.  Her last screening mammogram was on 06/09/2013 without findings suspicious for malignancy.    MEDICAL HISTORY: Past Medical History  Diagnosis Date  . Dyspnea   . Pulmonary embolism   . Breast cyst     right  . Hypothyroidism   . Renal cyst   . Leukocytopenia   . Monoclonal gammopathy   . Osteoarthritis   . Schizoaffective disorder   . Wears dentures   . Pulmonary hypertension   . Hypertension     pulmonary    INTERIM HISTORY: has HYPOTHYROIDISM; MONOCLONAL GAMMOPATHY; LEUKOCYTOPENIA UNSPECIFIED; SCHIZOAFFECTIVE DISORDER; RENAL CYST, RIGHT; BREAST CYST, RIGHT; OSTEOARTHRITIS; Chronic thromboembolic pulmonary hypertension; Chronic respiratory failure; Subdural hematoma, post-traumatic; Acute kidney injury; and Hypokalemia on her problem list.    ALLERGIES:  has No Known Allergies.  MEDICATIONS: has a current medication list which includes the following prescription(s): adempas, b-complex with vitamin c, benztropine, calcium-vitamin d, cyclosporine, furosemide, lamotrigine, levothyroxine, lorazepam, macitentan, multivitamin with minerals, olanzapine, polyethyl glycol-propyl glycol, spironolactone, temazepam, vitamin c, and warfarin.  SURGICAL HISTORY:  Past Surgical History  Procedure Laterality Date  . Tubal ligation  1970  . Tubal ligation    . Breast surgery      cyst   PROBLEM LIST:  1. 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 Urine was also positive for IgG kappa  monoclonal protein. A 24-hour urine protein at that time was normal. Metastatic bone survey carried out on 03/29/2003 was negative. Follow up immunoglobulins have been without any significant changes. There has been no suggestion of progression  to multiple myeloma.  2. History of leukopenia noted in November 2002.  3. History of schizoaffective disorder dating back to 70.  4. History of goiter treated with radioactive iodine in 2002, resulting in hypothyroidism under treatment.  5. Osteoporosis.  6. History of pulmonary emboli, on June 24, 2010, without precipitating factors. Current recommendation is for ongoing anticoagulation. A hypercoagulable workup was negative.  7. Pulmonary hypertension being followed at [facility] by Dr. Talmadge Coventry.  8. 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  9. Positive family history of colon cancer. The patient had a colonoscopy in 2008 and again on December 14, 2011, by Dr. Ewing Schlein. Pathology report indicated multiple fragments of a serrated adenoma. No high-grade dysplasia or malignancy was identified.  REVIEW OF SYSTEMS:   Constitutional: Denies fevers, chills or abnormal weight loss Eyes: Denies blurriness of vision Ears, nose, mouth, throat, and face: Denies mucositis or sore throat Respiratory: Denies cough, dyspnea or wheezes Cardiovascular: Denies palpitation, chest discomfort or lower extremity swelling Gastrointestinal:  Denies nausea, heartburn or change in bowel habits Skin: Denies abnormal skin rashes Lymphatics: Denies new lymphadenopathy or easy bruising Neurological:Denies numbness, tingling or new weaknesses Behavioral/Psych: Mood is stable, no new changes  All other systems were reviewed with the patient and are negative.  PHYSICAL EXAMINATION: ECOG PERFORMANCE STATUS: 1 - Symptomatic but completely ambulatory  Blood pressure 101/67, pulse 94, temperature 97.1 F (36.2 C), temperature source Oral, resp. rate 20, height 5\' 1"  (1.549 m),  weight 154 lb 11.2 oz (70.171 kg), SpO2 96.00%.  GENERAL:alert, no distress and comfortable; anxious appearing and tearful initial; on oxygen 4 liter/min by nasal cannula;  SKIN: skin color, texture, turgor are normal, no rashes or significant lesions EYES: normal, Conjunctiva are pink and non-injected, sclera clear OROPHARYNX:no exudate, no erythema and lips, buccal mucosa, and tongue normal  LYMPH:  no palpable lymphadenopathy in the cervical or supraclavicular LUNGS: clear to auscultation and percussion with normal breathing effort HEART: regular rate & rhythm and no murmurs and no lower extremity edema ABDOMEN:abdomen soft, non-tender and normal bowel sounds Musculoskeletal:no cyanosis of digits and no clubbing  NEURO: alert & oriented x 3 with fluent speech, no focal motor/sensory deficits  LABORATORY DATA: Results for orders placed in visit on 07/13/13 (from the past 48 hour(s))  CBC WITH DIFFERENTIAL     Status: Abnormal   Collection Time    07/13/13  9:39 AM      Result Value Range   WBC 3.1 (*) 3.9 - 10.3 10e3/uL   NEUT# 1.8  1.5 - 6.5 10e3/uL   HGB 13.4  11.6 - 15.9 g/dL   HCT 78.4  69.6 - 29.5 %   Platelets 214  145 - 400 10e3/uL   MCV 94.4  79.5 - 101.0 fL   MCH 31.2  25.1 - 34.0 pg   MCHC 33.1  31.5 - 36.0 g/dL   RBC 2.84  1.32 - 4.40 10e6/uL   RDW 14.7 (*) 11.2 - 14.5 %   lymph# 1.0  0.9 - 3.3 10e3/uL  MONO# 0.2  0.1 - 0.9 10e3/uL   Eosinophils Absolute 0.0  0.0 - 0.5 10e3/uL   Basophils Absolute 0.0  0.0 - 0.1 10e3/uL   NEUT% 58.7  38.4 - 76.8 %   LYMPH% 31.9  14.0 - 49.7 %   MONO% 7.1  0.0 - 14.0 %   EOS% 1.1  0.0 - 7.0 %   BASO% 1.2  0.0 - 2.0 %  TECHNOLOGIST REVIEW     Status: None   Collection Time    07/13/13  9:39 AM      Result Value Range   Technologist Review Variant lymphs present    COMPREHENSIVE METABOLIC PANEL (CC13)     Status: Abnormal   Collection Time    07/13/13  9:39 AM      Result Value Range   Sodium 138  136 - 145 mEq/L   Potassium  4.1  3.5 - 5.1 mEq/L   Chloride 106  98 - 109 mEq/L   CO2 24  22 - 29 mEq/L   Glucose 156 (*) 70 - 140 mg/dl   BUN 16.1  7.0 - 09.6 mg/dL   Creatinine 1.3 (*) 0.6 - 1.1 mg/dL   Total Bilirubin 0.45  0.20 - 1.20 mg/dL   Alkaline Phosphatase 47  40 - 150 U/L   AST 18  5 - 34 U/L   ALT 15  0 - 55 U/L   Total Protein 7.7  6.4 - 8.3 g/dL   Albumin 3.5  3.5 - 5.0 g/dL   Calcium 9.8  8.4 - 40.9 mg/dL   Anion Gap 7  3 - 11 mEq/L  IGG, IGA, IGM     Status: Abnormal   Collection Time    07/13/13  9:39 AM      Result Value Range   IgG (Immunoglobin G), Serum 2360 (*) 690 - 1700 mg/dL   IgA 46 (*) 69 - 811 mg/dL   IgM, Serum 20 (*) 52 - 322 mg/dL     Labs:  Lab Results  Component Value Date   WBC 3.1* 07/13/2013   HGB 13.4 07/13/2013   HCT 40.5 07/13/2013   MCV 94.4 07/13/2013   PLT 214 07/13/2013   NEUTROABS 1.8 07/13/2013      Chemistry      Component Value Date/Time   NA 138 07/13/2013 0939   NA 140 06/07/2013 1015   K 4.1 07/13/2013 0939   K 4.0 06/07/2013 1015   CL 106 06/07/2013 1015   CL 105 01/09/2013 1015   CO2 24 07/13/2013 0939   CO2 28 06/07/2013 1015   BUN 16.4 07/13/2013 0939   BUN 15 06/07/2013 1015   CREATININE 1.3* 07/13/2013 0939   CREATININE 1.20* 06/07/2013 1015      Component Value Date/Time   CALCIUM 9.8 07/13/2013 0939   CALCIUM 9.2 06/07/2013 1015   ALKPHOS 47 07/13/2013 0939   ALKPHOS 37* 04/29/2013 2021   AST 18 07/13/2013 0939   AST 18 04/29/2013 2021   ALT 15 07/13/2013 0939   ALT 18 04/29/2013 2021   BILITOT 0.55 07/13/2013 0939   BILITOT 0.4 04/29/2013 2021     Basic Metabolic Panel:  Recent Labs Lab 07/13/13 0939  NA 138  K 4.1  CO2 24  GLUCOSE 156*  BUN 16.4  CREATININE 1.3*  CALCIUM 9.8   GFR Estimated Creatinine Clearance: 36.1 ml/min (by C-G formula based on Cr of 1.3). Liver Function Tests:  Recent Labs Lab 07/13/13 0939  AST 18  ALT 15  ALKPHOS 47  BILITOT 0.55  PROT 7.7  ALBUMIN 3.5   CBC:  Recent Labs Lab  07/13/13 0939  WBC 3.1*  NEUTROABS 1.8  HGB 13.4  HCT 40.5  MCV 94.4  PLT 214   Microbiology Recent Results (from the past 240 hour(s))  TECHNOLOGIST REVIEW     Status: None   Collection Time    07/13/13  9:39 AM      Result Value Range Status   Technologist Review Variant lymphs present   Final   RADIOGRAPHIC STUDIES: 1. CT angiogram of the chest with IV contrast on 06/24/2010 showed possible pulmonary embolism. Blood clots were seen bilaterally in segmental and subsegmental arteries. Emboli appeared to be acute.  There were tiny bilateral pleural effusions. Small to moderate pericardial effusion.  2. CT scan of the abdomen and pelvis with IV contrast carried out on 08/15/2010 showed no CT-PET findings for metastatic disease involving the abdomen or pelvis. There were simple appearing  hepatic and right renal cysts.  3. Chest x-ray, 2 view, from 11/10/2010 showed no evidence for acute cardiopulmonary disease.  4. Screening bilateral mammograms from 04/21/2011 showed no specific mammographic evidence for malignancy.  5. CT scan of abdomen and pelvis with IV contrast on 02/25/2012 showed that the duodenum was under-distended, making evaluation difficult. Proximal jejunal wall appeared to be mildly thickened, possibly inflammatory in etiology. There were tiny bilateral pleural effusions, right greater than left, and a tiny pericardial effusion.  6. Digital bilateral screening mammogram on 06/08/2012 was negative. 7. Digital bilateral screening mammogram on 06/09/2013 was negative.    ASSESSMENT: Michaela Horton 72 y.o. female with a history of MONOCLONAL GAMMOPATHY - Plan: DG Bone Survey Met, CBC with Differential, Comprehensive metabolic panel, Kappa/lambda light chains, SPEP & IFE with QIG  Chronic thromboembolic pulmonary hypertension  LEUKOCYTOPENIA UNSPECIFIED - Plan: DG Bone Survey Met, CBC with Differential, Comprehensive metabolic panel   PLAN:  1. IgG Kappa MGUS. -- Mrs.  Horton' clinical condition appears to be stable. In addition, her serum and urine protein studies also seem to be stable. Even though her IgG level has been significantly elevated, this appears to be a benign monoclonal gammopathy. It appears that the patient has never had a bone marrow exam. She has a very tiny amount of monoclonal protein in her urine. Thus far, there has been no suggestion of progression to multiple myeloma. In addition, we will obtain an annual bone survey to examine for lytic lesions.   2. History of Leukopenia. -- This patient also has a mild asymptomatic and nonprogressive leukopenia.   3. History of pulmonary emboli (June 24, 2010) without precipitating factors. Chronic thromboembolic Pulm HTN. + IVC filter --continue Warfarin 7.5 mg 4 days then 5 mg x days.  INR check being done monthly per patient.  --Being followed by [facility] (Dr. Talmadge Coventry).  --Prior hypercoagulable workup was negative.    4. History of a fall on 04/29/2013.  -- Fall precautions.  We provided a detailed handout.  We are discussed that if this persists in the setting of being on coumadin and risk/benefit discussion should be ongoing.  She understood to report any additional falls.   5. Osteoporosis. -- She will continue calcium-vitamin D daily.  She will possibly benefit from physical therapy for bone strengthening exercises.    6. Positive family history of colon cancer. -- Reported [facility] COLONOSCOPY scheduled for 12/14/2011. Dr Ewing Schlein showed that polyps were removed from the cecum and the ascending colon.  Multiple fragments of serrated adenoma  revealed on pathology.  No high-grade dysplasia or malignancy identified.    7. Chronic Oxygen use.  -- Secondary to #3, management per pulmonary.   8. Follow-up.  --We will plan to see her again in 6 months, at which time we will check CBC, chemistries, and quantitative immunoglobulins.  All questions were answered. The patient knows to call the clinic with any  problems, questions or concerns. We can certainly see the patient much sooner if necessary.  I spent 25 minutes counseling the patient face to face. The total time spent in the appointment was 40 minutes.    Gurtaj Ruz, MD 07/14/2013 10:04 AM

## 2013-07-19 ENCOUNTER — Ambulatory Visit (HOSPITAL_COMMUNITY)
Admission: RE | Admit: 2013-07-19 | Discharge: 2013-07-19 | Disposition: A | Discharge status: Home / Self Care | Payer: Medicare Other | Source: Ambulatory Visit | Attending: Internal Medicine | Admitting: Internal Medicine

## 2013-07-19 DIAGNOSIS — D472 Monoclonal gammopathy: Secondary | ICD-10-CM | POA: Insufficient documentation

## 2013-07-19 DIAGNOSIS — D72819 Decreased white blood cell count, unspecified: Secondary | ICD-10-CM

## 2013-07-19 DIAGNOSIS — M47812 Spondylosis without myelopathy or radiculopathy, cervical region: Secondary | ICD-10-CM | POA: Insufficient documentation

## 2013-07-19 DIAGNOSIS — M899 Disorder of bone, unspecified: Secondary | ICD-10-CM | POA: Insufficient documentation

## 2013-07-19 DIAGNOSIS — M25519 Pain in unspecified shoulder: Secondary | ICD-10-CM | POA: Insufficient documentation

## 2013-07-31 ENCOUNTER — Ambulatory Visit (INDEPENDENT_AMBULATORY_CARE_PROVIDER_SITE_OTHER): Payer: Medicare Other | Admitting: Neurology

## 2013-07-31 ENCOUNTER — Encounter: Payer: Self-pay | Admitting: Neurology

## 2013-07-31 VITALS — BP 118/79 | HR 78 | Ht 61.0 in | Wt 155.0 lb

## 2013-07-31 DIAGNOSIS — I2789 Other specified pulmonary heart diseases: Secondary | ICD-10-CM

## 2013-07-31 DIAGNOSIS — S065XAA Traumatic subdural hemorrhage with loss of consciousness status unknown, initial encounter: Secondary | ICD-10-CM

## 2013-07-31 DIAGNOSIS — S065X9A Traumatic subdural hemorrhage with loss of consciousness of unspecified duration, initial encounter: Secondary | ICD-10-CM

## 2013-07-31 DIAGNOSIS — I2699 Other pulmonary embolism without acute cor pulmonale: Secondary | ICD-10-CM

## 2013-07-31 DIAGNOSIS — R269 Unspecified abnormalities of gait and mobility: Secondary | ICD-10-CM

## 2013-07-31 DIAGNOSIS — I6200 Nontraumatic subdural hemorrhage, unspecified: Secondary | ICD-10-CM

## 2013-07-31 DIAGNOSIS — J9610 Chronic respiratory failure, unspecified whether with hypoxia or hypercapnia: Secondary | ICD-10-CM

## 2013-07-31 DIAGNOSIS — Z9181 History of falling: Secondary | ICD-10-CM

## 2013-07-31 DIAGNOSIS — F259 Schizoaffective disorder, unspecified: Secondary | ICD-10-CM

## 2013-07-31 DIAGNOSIS — I2724 Chronic thromboembolic pulmonary hypertension: Secondary | ICD-10-CM

## 2013-07-31 DIAGNOSIS — R296 Repeated falls: Secondary | ICD-10-CM

## 2013-07-31 DIAGNOSIS — R413 Other amnesia: Secondary | ICD-10-CM

## 2013-07-31 NOTE — Patient Instructions (Signed)
I think overall you are doing fairly well but I do want to suggest a few things today:  Remember to drink plenty of fluid, eat healthy meals and do not skip any meals. Try to eat protein with a every meal and eat a healthy snack such as fruit or nuts in between meals. Try to keep a regular sleep-wake schedule and try to exercise daily, particularly in the form of walking, 20-30 minutes a day, if you can.   Use your cane at all times. I will refer you to physical therapy for gait safety assessment. I worry, that you may not be safe living by yourself, as you have fallen a few times and you are on coumadin, which increases your risk of bleeding.   Engage in social activities in your community and with your family and try to keep up with current events by reading the newspaper or watching the news.   As far as your medications are concerned, I would like to suggest no new changes.    As far as diagnostic testing: cognitive testing. Blood work.   I would like to see you back in 3 months, sooner if we need to. Please call us with any interim questions, concerns, problems, updates or refill requests.  Please also call us for any test results so we can go over those with you on the phone. Brett Canales is my clinical assistant and will answer any of your questions and relay your messages to me and also relay most of my messages to you.  Our phone number is [phone]. We also have an after hours call service for urgent matters and there is a physician on-call for urgent questions. For any emergencies you know to call 911 or go to the nearest emergency room.

## 2013-07-31 NOTE — Progress Notes (Signed)
Subjective:    Patient ID: Michaela Horton is a 72 y.o. female.  HPI    Michaela Foley, MD, PhD [facility] [address], [address] [address] [place], [place] [address]  Dear Dr. Zachery Horton,  I saw your patient, Michaela Horton, upon your kind request in my neurologic clinic today for initial consultation of her memory loss. The patient is accompanied by her daughter, Michaela Horton, today. As you know, Michaela Horton is a very pleasant 72 year old right-handed woman with an underlying medical history of hypothyroidism, schizoaffective disorder, osteoarthritis, chronic lung disease with Hx of PE, with pulmonary hypertension on O2 24/7 for the past 2 years, history of subdural hematoma, who has been experiencing memory difficulty for the past few months. She recently had some changes in her psychotropic medications per her psychiatrist, Dr. Donell Horton, and was taken off of Risperdal and tapered her off of Lithium and she was started on Lamictal, Zyprexa and Temazepam. She scored a 23/30 in your office on 07/18/13. She lives alone in a one story home and has 4 grown children. She is divorced and has been living by herself for about 13 years. Michaela Horton does her groceries. She has not driven a car in the last 2 months, d/t drowsiness. She says her children don't want her to drive and took her car keys. She states, she had a couple of black out spells. She fell in October and did not remember the fall. That prompted her CT head. Her daughter states, the fall was d/t low BP. She had had some change in her lung medications. She has fallen a few times. She was taken off of coumadin for some time, perhaps 2 months. She has insomnia.  She had a head CT without contrast on 06/07/2013 and I reviewed the report: No acute intracranial abnormality. Stable mild cerebral atrophy.  She had a head CT on 04/29/2013 after a fall and the report suggested a probable interhemispheric small subdural hematoma.   She  primarily has been having difficulty with short-term memory such as forgetfulness, misplacing things, asking the same question again and forgetting dates and events. There is no report of confusion or disorientation. Familiar faces are easily recognized. She reports no recurrent headaches. There is no family history of dementia - her mother lived to be 41, her father lived to be 85. He had colon cancer.  There is no recent report of Auditory Hallucinations and Visual Hallucinations and there are no delusions, such as paranoia.  She has not been on any dementia medications. She denies symptoms of depression, but endorses anxiety and denies suicidal ideations or homicidal ideations.  The patient denies prior TIA or stroke symptoms, such as sudden onset of one sided weakness, numbness, tingling, slurring of speech or droopy face, hearing loss, tinnitus, diplopia or visual field cut or monocular loss of vision, and denies recurrent headaches.  Of note, the patient denies snoring, and there is no report of witnessed apneas or choking sensations while asleep.   Her Past Medical History Is Significant For: Past Medical History  Diagnosis Date  . Dyspnea   . Pulmonary embolism   . Breast cyst     right  . Hypothyroidism   . Renal cyst   . Leukocytopenia   . Monoclonal gammopathy   . Osteoarthritis   . Schizoaffective disorder   . Wears dentures   . Pulmonary hypertension   . Hypertension     pulmonary    Her Past Surgical History Is Significant For:  Past Surgical History  Procedure Laterality Date  . Tubal ligation  1970  . Tubal ligation    . Breast surgery      cyst    Her Family History Is Significant For: Family History  Problem Relation Age of Onset  . Cancer Father     PROSTATE    Her Social History Is Significant For: History   Social History  . Marital Status: Divorced    Spouse Name: N/A    Number of Children: N/A  . Years of Education: N/A   Occupational History   . Retired     worked at a nursery   Social History Main Topics  . Smoking status: Never Smoker   . Smokeless tobacco: Never Used  . Alcohol Use: No  . Drug Use: No  . Sexual Activity: None   Other Topics Concern  . None   Social History Narrative  . None    Her Allergies Are:  No Known Allergies:   Her Current Medications Are:  Outpatient Encounter Prescriptions as of 07/31/2013  Medication Sig  . ADEMPAS 2.5 MG TABS Take 2.5 mg by mouth 3 (three) times daily.   . B Complex-C (B-COMPLEX WITH VITAMIN C) tablet Take 2 tablets by mouth daily.   . benztropine (COGENTIN) 0.5 MG tablet   . calcium-vitamin D (OSCAL WITH D) 500-200 MG-UNIT per tablet Take 1 tablet by mouth daily.   . cycloSPORINE (RESTASIS) 0.05 % ophthalmic emulsion Place 1 drop into both eyes every 12 (twelve) hours.   . furosemide (LASIX) 20 MG tablet Take 20 mg by mouth.  . lamoTRIgine (LAMICTAL) 100 MG tablet Take 200 mg by mouth at bedtime.   Marland Kitchen levothyroxine (SYNTHROID, LEVOTHROID) 112 MCG tablet Take 112 mcg by mouth daily before breakfast.   . lidocaine (LIDODERM) 5 % 1 patch as needed.  Marland Kitchen LORazepam (ATIVAN) 0.5 MG tablet Take 1 mg by mouth daily as needed for anxiety.   . Macitentan (OPSUMIT) 10 MG TABS Take by mouth.  . Multiple Vitamin (MULTIVITAMIN WITH MINERALS) TABS tablet Take 1 tablet by mouth every morning.  Marland Kitchen OLANZapine (ZYPREXA PO) Take 1 tablet by mouth at bedtime.  Bertram Gala Glycol-Propyl Glycol (SYSTANE OP) Place 1-2 drops into both eyes daily.  Marland Kitchen spironolactone (ALDACTONE) 50 MG tablet Take 50 mg by mouth daily.   . temazepam (RESTORIL) 15 MG capsule Take 30 mg by mouth.   . vitamin C (ASCORBIC ACID) 500 MG tablet Take 500 mg by mouth daily.   Marland Kitchen warfarin (COUMADIN) 5 MG tablet 7.5 mg 4 days then 5 mg x 3 days   Review of Systems:  Out of a complete 14 point review of systems, all are reviewed and negative with the exception of these symptoms as listed below:   Review of Systems   Constitutional: Positive for fatigue and unexpected weight change.  HENT: Negative.   Eyes: Negative.   Respiratory: Positive for cough and shortness of breath.   Cardiovascular: Positive for leg swelling.  Gastrointestinal: Negative.   Endocrine: Negative.   Genitourinary: Negative.   Musculoskeletal: Negative.   Skin: Negative.   Allergic/Immunologic: Negative.   Neurological: Positive for syncope, speech difficulty and weakness.       Memory loss  Hematological: Negative.   Psychiatric/Behavioral: Positive for sleep disturbance. The patient is nervous/anxious.     Objective:  Neurologic Exam  Physical Exam Physical Examination:   Filed Vitals:   07/31/13 1315  BP: 118/79  Pulse: 78    General  Examination: The patient is a very pleasant 72 y.o. female in no acute distress. She is calm and cooperative with the exam. She denies Auditory Hallucinations and Visual Hallucinations. She is well groomed and situated in a chair.   HEENT: Normocephalic, atraumatic, pupils are equal, round and reactive to light and accommodation. Funduscopic exam is normal with sharp disc margins noted. Extraocular tracking shows mild saccadic breakdown without nystagmus noted. Hearing is intact. Tympanic membranes are clear bilaterally. Face is symmetric with no facial masking and normal facial sensation. There is no lip, neck or jaw tremor. Neck is not rigid with intact passive ROM. There are no carotid bruits on auscultation. Oropharynx exam reveals moderate mouth dryness. No significant airway crowding is noted. Mallampati is class I. Tongue protrudes centrally and palate elevates symmetrically. She has an O2 cannula and a portable oxygen tank.   Chest: is clear to auscultation without wheezing, rhonchi or crackles noted.  Heart: sounds are regular and normal without murmurs, rubs or gallops noted.   Abdomen: is soft, non-tender and non-distended with normal bowel sounds appreciated on  auscultation.  Extremities: There is no pitting edema in the distal lower extremities bilaterally. Pedal pulses are intact.   Skin: is warm and dry with no trophic changes noted. Age-related changes are noted on the skin.   Musculoskeletal: exam reveals no obvious joint deformities, tenderness or joint swelling or erythema.   Neurologically:  Mental status: The patient is awake and alert, paying good  attention. She is able to partially provide the history. Her daughter provides details. She is oriented to: person, place, situation, day of week, month of year and year. She is off by one day in terms of exact date. Her memory, attention, language and knowledge are impaired mildly. I did not repeat her MMSE today as she recently had testing in your office. There is no aphasia, agnosia, apraxia or anomia. She has mild slowness in thinking. Her speech is mildly hypophonic and almost sounds like she has spasmodic dysphonia. She has a mild voice tremor.   Cranial nerves are as described above under HEENT exam. In addition, shoulder shrug is normal with equal shoulder height noted.  Motor exam: Normal bulk, and strength for age is noted. Tone is not rigid with absence of cogwheeling in the extremities. There is overall bradykinesia. There is no drift or rebound. There is no tremor.   Reflexes are 1+ in the upper extremities and trace in the knees and absent in both ankles. Fine motor skills: Finger taps, hand movements, and rapid alternating patting are mildly impaired bilaterally. Foot taps and foot agility are mildly impaired bilaterally.   Cerebellar testing shows no dysmetria or intention tremor on finger to nose testing. There is no truncal or gait ataxia.   Sensory exam is intact to light touch, pinprick, vibration, temperature sense in the upper and lower extremities.   Gait, station and balance: She stands up from the seated position with mild difficulty and needs to push herself up. No veering  to one side is noted. No leaning to one side. Posture is mildly stooped, perhaps age appropriate. Stance is wide-based. She walks with her cane in her right hand and turns in 3 steps. Tandem walk is not tested. Her balance is mildly impaired. She walks slightly insecurely and slowly. She has preserved arm swing on the left.   Assessment and Plan:    In summary, Michaela Horton is a very pleasant 72 y.o.-year old female with an underlying medical history  of hypothyroidism, schizoaffective disorder, osteoarthritis, chronic lung disease with Hx of PE, pulmonary hypertension on O2 24/7 for the past 2 years, history of recent subdural hematoma, who has been experiencing memory difficulty for the past few months. I think it is going to be difficult for me to tease out if she has memory loss due to her underlying mood disorder or due to being on significant psychotropic medication. Her underlying medical history is also a player including her significant lung disease and requirement for oxygen. At this juncture I would like to ask her to undergo formal memory testing in the form of neuropsychological evaluation. The patient and her daughter were in agreement. To that and I will refer her to a neuropsychologist. Furthermore, I worry about her gait safety and fall risk. She has fallen a few times. She even has sustained a small subdural hematoma recently. She is at great risk for intracranial hemorrhage because of her history of fall and I voiced my concern to the patient and her daughter today. I am not sure that she is safe to live by herself. Her daughter states that they are looking into a life alert button for her. Her daughter also states that her mother is not yet ready to leave her home or have somebody live with her. I explained to them that they may have to decide is a family a longer term living situation for the patient that is in her best interest especially with regards to safety at home. I would like for  her to be evaluated by physical therapy in terms of her gait safety and need for an assistive device more than just a cane. To that end I also made a referral to physical therapy. The patient was in agreement. I will see her back in about 3 months, sooner for need arises. I will initiate some blood work today and we will call her with the test results.  Thank you very much for allowing me to participate in the care of this nice patient. If I can be of any further assistance to you please do not hesitate to call me at [phone].  Sincerely,   Michaela Foley, MD, PhD

## 2013-08-01 LAB — SEDIMENTATION RATE: Sed Rate: 17 mm/hr (ref 0–40)

## 2013-08-01 LAB — HGB A1C W/O EAG: Hgb A1c MFr Bld: 6.1 % — ABNORMAL HIGH (ref 4.8–5.6)

## 2013-08-01 LAB — COMPREHENSIVE METABOLIC PANEL
AST: 20 IU/L (ref 0–40)
Alkaline Phosphatase: 46 IU/L (ref 39–117)
BUN: 16 mg/dL (ref 8–27)
CO2: 23 mmol/L (ref 18–29)
Calcium: 9.6 mg/dL (ref 8.6–10.2)
Creatinine, Ser: 1.39 mg/dL — ABNORMAL HIGH (ref 0.57–1.00)
GFR calc non Af Amer: 38 mL/min/{1.73_m2} — ABNORMAL LOW (ref >59)
Globulin, Total: 3.4 g/dL (ref 1.5–4.5)
Glucose: 75 mg/dL (ref 65–99)
Sodium: 139 mmol/L (ref 134–144)
Total Protein: 7.2 g/dL (ref 6.0–8.5)

## 2013-08-01 LAB — RPR: RPR: NONREACTIVE

## 2013-08-01 LAB — B12 AND FOLATE PANEL: Vitamin B-12: 1405 pg/mL — ABNORMAL HIGH (ref 211–946)

## 2013-08-01 LAB — TSH: TSH: 1.28 u[IU]/mL (ref 0.450–4.500)

## 2013-08-02 ENCOUNTER — Other Ambulatory Visit: Payer: Self-pay | Admitting: Family Medicine

## 2013-08-02 ENCOUNTER — Ambulatory Visit
Admission: RE | Admit: 2013-08-02 | Discharge: 2013-08-02 | Disposition: A | Discharge status: Home / Self Care | Payer: Medicare Other | Source: Ambulatory Visit | Attending: Family Medicine | Admitting: Family Medicine

## 2013-08-02 DIAGNOSIS — M542 Cervicalgia: Secondary | ICD-10-CM

## 2013-08-10 NOTE — Progress Notes (Signed)
Quick Note:  Spoke with patient and relayed results of blood work as well as Dr Teofilo Pod advice / instructions. The patient was also reminded of any future appointments. Patient understood and had no questions.  ______

## 2013-08-10 NOTE — Progress Notes (Signed)
Quick Note:  Please advise pt that her labs show mild kidney function impairment, but not much off from her other previous labs. Normal thyroid function screen, increased risk for diabetes due to HBA1c of 6.1 and increase in vit B12, indicating, that she is likely taking B12 shots or on oral B vitamin. She may not need to take extra B12. If prescribed by her PCP, ask pt to discuss with PCP, also discuss increased risk for diabetes.  [name], MD, PhD [facility] ([facility]  ______

## 2013-10-02 ENCOUNTER — Encounter: Payer: Self-pay | Admitting: Internal Medicine

## 2013-10-02 ENCOUNTER — Ambulatory Visit (INDEPENDENT_AMBULATORY_CARE_PROVIDER_SITE_OTHER): Payer: Medicare Other | Admitting: Internal Medicine

## 2013-10-02 VITALS — BP 104/60 | HR 86 | Temp 98.0°F | Ht 61.5 in | Wt 156.0 lb

## 2013-10-02 DIAGNOSIS — I2789 Other specified pulmonary heart diseases: Secondary | ICD-10-CM

## 2013-10-02 DIAGNOSIS — J9610 Chronic respiratory failure, unspecified whether with hypoxia or hypercapnia: Secondary | ICD-10-CM

## 2013-10-02 DIAGNOSIS — I2724 Chronic thromboembolic pulmonary hypertension: Secondary | ICD-10-CM

## 2013-10-02 DIAGNOSIS — I2699 Other pulmonary embolism without acute cor pulmonale: Secondary | ICD-10-CM

## 2013-10-02 NOTE — Progress Notes (Signed)
Subjective:    Patient ID: Michaela Horton, female    DOB: 03/29/1941    MRN: [id number]     Brief patient profile:  64 yobf never smoker dx'd with PE by CTangiogram 06/24/2010 and referred by Dr Drema Dallas to pulmonary clinic for eval of persistent doe since then with Dx of [place].   History of Present Illness  November 04, 2010 1st pulmonary office eval cc doe x 8 month no better since dx and rx of PE. doe x 50 ft  no assoc cough, mild leg swelling.  desat walking, rec v/q and repeat echo >  Mod to high prob/ severe PAH  12/19/10 ov/Sade Mehlhoff:  Cc no change doe, returns for PFT's (minimal airflow obst with   dlco =51%).   rec You still appear to have blood blots in your lungs 6 months after you started coumadin so you need to stay on this Referred to [facility] clinic > trachleer started       09/24/2011 f/u ov/Lauri Till cc Unchanged doe- here to recertify for o2 prn with exertion. Sats when arrived 87%ra but then improved to 93 at rest At rest not typically using any 02 but  automatically at bedtime and   On 02 2 lpm can shop at [place] s decline in ability and thinking about exercise program. No cough or leg swelling rec 02 should be worn automatically at bedtime and for any more activity than walking across a room at home  At 2lpm When really exerting yourself use 4lpm   05/18/2013 f/u ov/Mirage Pfefferkorn re: 02 recert Chief Complaint  Patient presents with  . Follow-up    Pt states here to see about getting qualified for POC. Pt states that her breathing is doing well. She c/o wheezing and cough for the past 2 months. Cough is non prod.   2 lpm at rest 5 lpm with activity and can still do aisles at [facility] x 2 then has to rest due to sob/ fatigue No leg swelling or am ha rec 2lpm  24/7 but increase to 4lpm with any activity   10/02/2013 f/u ov/Erol Flanagin re: chronic resp failure/ 02 dep secondary to [place]  Patient presents with  . Follow-up    Pt c/o increased SOB for the past 6 wks. She also c/o  fatigue. Sometimes forgets to use o2, but seems okay when she does.    Not using 02 as rec ? 4lpm when she thinks to put  it on rather than 2/4 as rec previously, denies getting any written instructions from [facility] clinic    No obvious day to day or daytime variabilty or assoc chronic cough or cp or chest tightness, subjective wheeze overt sinus or hb symptoms. No unusual exp hx or h/o childhood pna/ asthma or knowledge of premature birth.  Sleeping ok without nocturnal  or early am exacerbation  of respiratory  c/o's or need for noct saba. Also denies any obvious fluctuation of symptoms with weather or environmental changes or other aggravating or alleviating factors except as outlined above   Current Medications, Allergies, Complete Past Medical History, Past Surgical History, Family History, and Social History were reviewed in [employer] record.  ROS  The following are not active complaints unless bolded sore throat, dysphagia, dental problems, itching, sneezing,  nasal congestion or excess/ purulent secretions, ear ache,   fever, chills, sweats, unintended wt loss, pleuritic or exertional cp, hemoptysis,  orthopnea pnd or leg swelling, presyncope, palpitations, heartburn, abdominal pain,  anorexia, nausea, vomiting, diarrhea  or change in bowel or urinary habits, change in stools or urine, dysuria,hematuria,  rash, arthralgias, visual complaints, headache, numbness weakness or ataxia or problems with walking or coordination,  change in mood/affect or memory.                Past Medical History:  DYSPNEA (ICD-786.05)      - PFT's  FEV1  1.42 (69%) ratio 65 with DLCO 51%  PULMONARY EMBOLISM, HX OF (ICD-V12.51)  - Dx 05/2010  - Venous dopplers neg 08/19/2010  - Echocardiogram 11/28/10:  Severely elevated RV sys pressure with dilated R Ht est at 99 with nl LV -VQ  11/05/10  Mod to high prob PE BREAST CYST, RIGHT (ICD-610.0)  EFFUSION, PLEURAL (ICD-511.9)  RENAL  CYST, RIGHT (ICD-593.2)  HYPOTHYROIDISM (ICD-244.9)  LEUKOCYTOPENIA UNSPECIFIED (ICD-288.50)  MONOCLONAL GAMMOPATHY (ICD-273.1)  OSTEOARTHRITIS (ICD-715.90)  SCHIZOAFFECTIVE DISORDER (ICD-295.70)    Past Surgical History:  Tubal ligation 1970   Social History:  Divorced  Children  Never smoker  No ETOH  Retired from working at a nursery                  Objective:   Physical Exam  amb bf nad unusual affect,  Very evasive with responses, confused with details of care     wt 177 November 05, 2010  >  173 12/19/2010 > 09/24/2011  179 > 161 05/18/2013 > 10/02/2013  156   HEENT: nl dentition, turbinates, and orophanx. Nl external ear canals without cough reflex  NECK : without JVD/Nodes/TM/ nl carotid upstrokes bilaterally  LUNGS: no acc muscle use, clear to A and P bilaterally without cough on insp or exp maneuvers  CV: RRR no s3 or murmur , definite increase in P2, no edema  ABD: soft and nontender with nl excursion in the supine position. No bruits or organomegaly, bowel sounds nl  MS: warm without deformities, calf tenderness, cyanosis or clubbing  SKIN: warm and dry without lesions     Assessment & Plan:

## 2013-10-02 NOTE — Assessment & Plan Note (Signed)
>   referred to [facility] 12/19/2010  And started on 02 24 h/day    PULMONARY EMBOLISM, HX OF (ICD-V12.51)  - Dx 05/2010  - Venous dopplers neg 08/19/2010  - Echocardiogram 11/28/10:  Severely elevated RV sys pressure with dilated R Ht est at 99 with nl LV -VQ  11/05/10  Mod to high prob PE > referred to [facility]  Defer all rx re meds to [facility] with caveat she needs extra time on instructions on how to use them and probably also needs family's assistance getting the meds right.

## 2013-10-02 NOTE — Patient Instructions (Addendum)
Please see patient coordinator before you leave today  to schedule ono 2lpm - otherwise keep you 02 on 4lpm 24/7  All pulmonary follow up at [facility] - return here as needed

## 2013-10-05 ENCOUNTER — Emergency Department (HOSPITAL_COMMUNITY)
Admission: EM | Admit: 2013-10-05 | Discharge: 2013-10-05 | Discharge status: Against Medical Advice | Payer: Medicare Other | Attending: Emergency Medicine | Admitting: Emergency Medicine

## 2013-10-05 ENCOUNTER — Encounter (HOSPITAL_COMMUNITY): Payer: Self-pay | Admitting: Emergency Medicine

## 2013-10-05 DIAGNOSIS — I10 Essential (primary) hypertension: Secondary | ICD-10-CM | POA: Insufficient documentation

## 2013-10-05 DIAGNOSIS — R0602 Shortness of breath: Secondary | ICD-10-CM | POA: Diagnosis not present

## 2013-10-05 DIAGNOSIS — F411 Generalized anxiety disorder: Secondary | ICD-10-CM | POA: Diagnosis present

## 2013-10-05 NOTE — ED Notes (Signed)
PT states she forgot to put her oxygen on when she laid in bed yesterday.  After doing exercise in bed, she realized she didn't have her oxygen and it made her anxious.  Pt feels very weak today.

## 2013-10-05 NOTE — ED Notes (Signed)
Pt states she was leaving and would call her doctor

## 2013-10-13 ENCOUNTER — Telehealth: Payer: Self-pay | Admitting: Internal Medicine

## 2013-10-13 NOTE — Telephone Encounter (Signed)
lmtcb x1 for Michaela Horton w/ family member He also gave me another # to try. [phone] Called # and received message the VM has not been st up. wcb

## 2013-10-15 ENCOUNTER — Inpatient Hospital Stay (HOSPITAL_COMMUNITY)
Admission: EM | Admit: 2013-10-15 | Discharge: 2013-10-19 | DRG: 917 | Disposition: A | Discharge status: Home / Self Care | Payer: Medicare Other | Attending: Internal Medicine | Admitting: Internal Medicine

## 2013-10-15 ENCOUNTER — Emergency Department (HOSPITAL_COMMUNITY): Payer: Medicare Other

## 2013-10-15 ENCOUNTER — Encounter (HOSPITAL_COMMUNITY): Payer: Self-pay | Admitting: Emergency Medicine

## 2013-10-15 DIAGNOSIS — T50901A Poisoning by unspecified drugs, medicaments and biological substances, accidental (unintentional), initial encounter: Secondary | ICD-10-CM

## 2013-10-15 DIAGNOSIS — G929 Unspecified toxic encephalopathy: Secondary | ICD-10-CM | POA: Diagnosis present

## 2013-10-15 DIAGNOSIS — Z7901 Long term (current) use of anticoagulants: Secondary | ICD-10-CM

## 2013-10-15 DIAGNOSIS — T50904A Poisoning by unspecified drugs, medicaments and biological substances, undetermined, initial encounter: Secondary | ICD-10-CM | POA: Diagnosis present

## 2013-10-15 DIAGNOSIS — I5032 Chronic diastolic (congestive) heart failure: Secondary | ICD-10-CM | POA: Diagnosis present

## 2013-10-15 DIAGNOSIS — D472 Monoclonal gammopathy: Secondary | ICD-10-CM

## 2013-10-15 DIAGNOSIS — E039 Hypothyroidism, unspecified: Secondary | ICD-10-CM | POA: Diagnosis present

## 2013-10-15 DIAGNOSIS — I2789 Other specified pulmonary heart diseases: Secondary | ICD-10-CM | POA: Diagnosis present

## 2013-10-15 DIAGNOSIS — F319 Bipolar disorder, unspecified: Secondary | ICD-10-CM | POA: Diagnosis present

## 2013-10-15 DIAGNOSIS — Z9119 Patient's noncompliance with other medical treatment and regimen: Secondary | ICD-10-CM

## 2013-10-15 DIAGNOSIS — T6591XA Toxic effect of unspecified substance, accidental (unintentional), initial encounter: Principal | ICD-10-CM | POA: Diagnosis present

## 2013-10-15 DIAGNOSIS — R4182 Altered mental status, unspecified: Secondary | ICD-10-CM | POA: Diagnosis present

## 2013-10-15 DIAGNOSIS — Z6827 Body mass index (BMI) 27.0-27.9, adult: Secondary | ICD-10-CM

## 2013-10-15 DIAGNOSIS — G92 Toxic encephalopathy: Secondary | ICD-10-CM | POA: Diagnosis present

## 2013-10-15 DIAGNOSIS — Z8673 Personal history of transient ischemic attack (TIA), and cerebral infarction without residual deficits: Secondary | ICD-10-CM

## 2013-10-15 DIAGNOSIS — I2782 Chronic pulmonary embolism: Secondary | ICD-10-CM | POA: Diagnosis present

## 2013-10-15 DIAGNOSIS — Z91199 Patient's noncompliance with other medical treatment and regimen due to unspecified reason: Secondary | ICD-10-CM

## 2013-10-15 DIAGNOSIS — I509 Heart failure, unspecified: Secondary | ICD-10-CM | POA: Diagnosis present

## 2013-10-15 DIAGNOSIS — T65891A Toxic effect of other specified substances, accidental (unintentional), initial encounter: Secondary | ICD-10-CM | POA: Diagnosis present

## 2013-10-15 DIAGNOSIS — J9610 Chronic respiratory failure, unspecified whether with hypoxia or hypercapnia: Secondary | ICD-10-CM | POA: Diagnosis present

## 2013-10-15 DIAGNOSIS — I2724 Chronic thromboembolic pulmonary hypertension: Secondary | ICD-10-CM | POA: Diagnosis present

## 2013-10-15 DIAGNOSIS — F259 Schizoaffective disorder, unspecified: Secondary | ICD-10-CM | POA: Diagnosis present

## 2013-10-15 DIAGNOSIS — J9611 Chronic respiratory failure with hypoxia: Secondary | ICD-10-CM | POA: Diagnosis present

## 2013-10-15 DIAGNOSIS — R0902 Hypoxemia: Secondary | ICD-10-CM

## 2013-10-15 LAB — COMPREHENSIVE METABOLIC PANEL
ALBUMIN: 3.7 g/dL (ref 3.5–5.2)
ALK PHOS: 46 U/L (ref 39–117)
ALT: 20 U/L (ref 0–35)
AST: 26 U/L (ref 0–37)
BUN: 21 mg/dL (ref 6–23)
CHLORIDE: 105 meq/L (ref 96–112)
CO2: 23 meq/L (ref 19–32)
Calcium: 9.3 mg/dL (ref 8.4–10.5)
Creatinine, Ser: 1.23 mg/dL — ABNORMAL HIGH (ref 0.50–1.10)
GFR calc Af Amer: 50 mL/min — ABNORMAL LOW (ref >90)
GFR calc non Af Amer: 43 mL/min — ABNORMAL LOW (ref >90)
Glucose, Bld: 95 mg/dL (ref 70–99)
Potassium: 4.1 mEq/L (ref 3.7–5.3)
Sodium: 140 mEq/L (ref 137–147)
Total Bilirubin: 0.5 mg/dL (ref 0.3–1.2)
Total Protein: 7.9 g/dL (ref 6.0–8.3)

## 2013-10-15 LAB — BLOOD GAS, ARTERIAL
ACID-BASE DEFICIT: 1.1 mmol/L (ref 0.0–2.0)
BICARBONATE: 22.3 meq/L (ref 20.0–24.0)
Drawn by: 310571
O2 Content: 2 L/min
O2 Saturation: 88.6 %
PH ART: 7.42 (ref 7.350–7.450)
PO2 ART: 55.9 mmHg — AB (ref 80.0–100.0)
Patient temperature: 98.6
TCO2: 19.3 mmol/L (ref 0–100)
pCO2 arterial: 35 mmHg (ref 35.0–45.0)

## 2013-10-15 LAB — ETHANOL
Alcohol, Ethyl (B): <11 mg/dL (ref 0–11)
Alcohol, Ethyl (B): <11 mg/dL (ref 0–11)

## 2013-10-15 LAB — URINALYSIS, ROUTINE W REFLEX MICROSCOPIC
BILIRUBIN URINE: NEGATIVE
GLUCOSE, UA: NEGATIVE mg/dL
HGB URINE DIPSTICK: NEGATIVE
KETONES UR: 15 mg/dL — AB
Leukocytes, UA: NEGATIVE
Nitrite: NEGATIVE
Protein, ur: NEGATIVE mg/dL
Specific Gravity, Urine: 1.015 (ref 1.005–1.030)
UROBILINOGEN UA: 0.2 mg/dL (ref 0.0–1.0)
pH: 7.5 (ref 5.0–8.0)

## 2013-10-15 LAB — CBC
HEMATOCRIT: 43.2 % (ref 36.0–46.0)
Hemoglobin: 14.6 g/dL (ref 12.0–15.0)
MCH: 31.7 pg (ref 26.0–34.0)
MCHC: 33.8 g/dL (ref 30.0–36.0)
MCV: 93.7 fL (ref 78.0–100.0)
PLATELETS: 192 10*3/uL (ref 150–400)
RBC: 4.61 MIL/uL (ref 3.87–5.11)
RDW: 14.8 % (ref 11.5–15.5)
WBC: 3.4 10*3/uL — AB (ref 4.0–10.5)

## 2013-10-15 LAB — RAPID URINE DRUG SCREEN, HOSP PERFORMED
Amphetamines: NOT DETECTED
BARBITURATES: NOT DETECTED
BENZODIAZEPINES: NOT DETECTED
Cocaine: NOT DETECTED
Opiates: NOT DETECTED
Tetrahydrocannabinol: NOT DETECTED

## 2013-10-15 LAB — TROPONIN I

## 2013-10-15 LAB — ACETAMINOPHEN LEVEL

## 2013-10-15 LAB — PRO B NATRIURETIC PEPTIDE: Pro B Natriuretic peptide (BNP): 599.3 pg/mL — ABNORMAL HIGH (ref 0–125)

## 2013-10-15 LAB — MRSA PCR SCREENING: MRSA BY PCR: NEGATIVE

## 2013-10-15 LAB — PROTIME-INR
INR: 1.62 — AB (ref 0.00–1.49)
Prothrombin Time: 18.8 seconds — ABNORMAL HIGH (ref 11.6–15.2)

## 2013-10-15 LAB — SALICYLATE LEVEL

## 2013-10-15 LAB — LACTIC ACID, PLASMA: Lactic Acid, Venous: 1.9 mmol/L (ref 0.5–2.2)

## 2013-10-15 MED ORDER — SODIUM CHLORIDE 0.9 % IV SOLN
INTRAVENOUS | Status: DC
  Administered 2013-10-15: 75 mL via INTRAVENOUS
  Administered 2013-10-17: 1000 mL via INTRAVENOUS
  Administered 2013-10-17: 75 mL via INTRAVENOUS
  Administered 2013-10-18: 1000 mL via INTRAVENOUS
  Administered 2013-10-18: 11:00:00 via INTRAVENOUS

## 2013-10-15 MED ORDER — ONDANSETRON HCL 4 MG PO TABS: 4.0000 mg | ORAL_TABLET | Freq: Four times a day (QID) | ORAL | Status: DC | PRN

## 2013-10-15 MED ORDER — WARFARIN SODIUM 10 MG PO TABS
10.0000 mg | ORAL_TABLET | Freq: Once | ORAL | Status: AC
  Administered 2013-10-15: 10 mg via ORAL
  Filled 2013-10-15: qty 1

## 2013-10-15 MED ORDER — LEVOTHYROXINE SODIUM 112 MCG PO TABS
112.0000 ug | ORAL_TABLET | Freq: Every day | ORAL | Status: DC
  Filled 2013-10-15 (×2): qty 1

## 2013-10-15 MED ORDER — MACITENTAN 10 MG PO TABS
10.0000 mg | ORAL_TABLET | Freq: Every day | ORAL | Status: DC
  Administered 2013-10-15 – 2013-10-16 (×2): 10 mg via ORAL

## 2013-10-15 MED ORDER — ZIPRASIDONE MESYLATE 20 MG IM SOLR
10.0000 mg | Freq: Once | INTRAMUSCULAR | Status: AC
  Administered 2013-10-15: 10 mg via INTRAMUSCULAR
  Filled 2013-10-15: qty 20

## 2013-10-15 MED ORDER — CALCIUM CARBONATE-VITAMIN D 500-200 MG-UNIT PO TABS
1.0000 | ORAL_TABLET | Freq: Two times a day (BID) | ORAL | Status: DC
  Administered 2013-10-15 – 2013-10-18 (×6): 1 via ORAL
  Filled 2013-10-15 (×9): qty 1

## 2013-10-15 MED ORDER — B COMPLEX-C PO TABS
1.0000 | ORAL_TABLET | Freq: Every day | ORAL | Status: DC
  Administered 2013-10-15 – 2013-10-18 (×4): 1 via ORAL
  Filled 2013-10-15 (×5): qty 1

## 2013-10-15 MED ORDER — ASPIRIN EC 81 MG PO TBEC
81.0000 mg | DELAYED_RELEASE_TABLET | Freq: Every day | ORAL | Status: DC
  Administered 2013-10-15 – 2013-10-18 (×4): 81 mg via ORAL
  Filled 2013-10-15 (×6): qty 1

## 2013-10-15 MED ORDER — LAMOTRIGINE 100 MG PO TABS
100.0000 mg | ORAL_TABLET | Freq: Every day | ORAL | Status: DC
  Administered 2013-10-15 – 2013-10-18 (×3): 100 mg via ORAL
  Filled 2013-10-15 (×5): qty 1

## 2013-10-15 MED ORDER — LORAZEPAM 2 MG/ML IJ SOLN
2.0000 mg | Freq: Once | INTRAMUSCULAR | Status: AC
  Administered 2013-10-15: 2 mg via INTRAVENOUS
  Filled 2013-10-15: qty 1

## 2013-10-15 MED ORDER — LORAZEPAM 2 MG/ML IJ SOLN
1.0000 mg | Freq: Four times a day (QID) | INTRAMUSCULAR | Status: DC | PRN
  Administered 2013-10-16: 3 mg via INTRAVENOUS
  Administered 2013-10-16 – 2013-10-17 (×7): 2 mg via INTRAVENOUS
  Administered 2013-10-18: 4 mg via INTRAVENOUS
  Administered 2013-10-18: 2 mg via INTRAVENOUS
  Administered 2013-10-19: 4 mg via INTRAVENOUS
  Filled 2013-10-15 (×3): qty 2
  Filled 2013-10-15 (×6): qty 1
  Filled 2013-10-15: qty 2
  Filled 2013-10-15: qty 1

## 2013-10-15 MED ORDER — RIOCIGUAT 2.5 MG PO TABS
2.5000 mg | ORAL_TABLET | Freq: Three times a day (TID) | ORAL | Status: DC
  Administered 2013-10-15 – 2013-10-16 (×3): 2.5 mg via ORAL

## 2013-10-15 MED ORDER — WARFARIN - PHARMACIST DOSING INPATIENT: Freq: Every day | Status: DC

## 2013-10-15 MED ORDER — VITAMIN C 500 MG PO TABS
500.0000 mg | ORAL_TABLET | Freq: Every day | ORAL | Status: DC
  Administered 2013-10-15 – 2013-10-18 (×4): 500 mg via ORAL
  Filled 2013-10-15 (×5): qty 1

## 2013-10-15 MED ORDER — OLANZAPINE 7.5 MG PO TABS
7.5000 mg | ORAL_TABLET | Freq: Every day | ORAL | Status: DC
  Administered 2013-10-15 – 2013-10-18 (×3): 7.5 mg via ORAL
  Filled 2013-10-15 (×5): qty 1

## 2013-10-15 MED ORDER — STERILE WATER FOR INJECTION IJ SOLN
INTRAMUSCULAR | Status: AC
  Filled 2013-10-15: qty 10

## 2013-10-15 MED ORDER — ADULT MULTIVITAMIN W/MINERALS CH
1.0000 | ORAL_TABLET | Freq: Every morning | ORAL | Status: DC
  Administered 2013-10-16 – 2013-10-18 (×3): 1 via ORAL
  Filled 2013-10-15 (×4): qty 1

## 2013-10-15 MED ORDER — ONDANSETRON HCL 4 MG/2ML IJ SOLN: 4.0000 mg | Freq: Four times a day (QID) | INTRAMUSCULAR | Status: DC | PRN

## 2013-10-15 MED ORDER — LORAZEPAM 2 MG/ML IJ SOLN
1.0000 mg | Freq: Once | INTRAMUSCULAR | Status: AC
  Administered 2013-10-15: 1 mg via INTRAVENOUS
  Filled 2013-10-15: qty 1

## 2013-10-15 MED ORDER — SPIRONOLACTONE 50 MG PO TABS
50.0000 mg | ORAL_TABLET | Freq: Every day | ORAL | Status: DC
  Administered 2013-10-15 – 2013-10-18 (×4): 50 mg via ORAL
  Filled 2013-10-15 (×5): qty 1

## 2013-10-15 NOTE — ED Notes (Signed)
She continues to be agitated; and 2 [employer] officers remain with her.  She is also in 4-point soft restraints.

## 2013-10-15 NOTE — Progress Notes (Signed)
ANTICOAGULATION CONSULT NOTE - Initial Consult  Pharmacy Consult for Warfarin Indication: h/o PE  No Known Allergies  Patient Measurements:    Vital Signs: Temp: 97.6 F (36.4 C) (02/15 1455) Temp src: Oral (02/15 1455) BP: 125/78 mmHg (02/15 1451) Pulse Rate: 82 (02/15 1451)  Labs:  Recent Labs  10/15/13 1205  HGB 14.6  HCT 43.2  PLT 192  LABPROT 18.8*  INR 1.62*  CREATININE 1.23*  TROPONINI <0.30    The CrCl is unknown because both a height and weight (above a minimum accepted value) are required for this calculation.   Medical History: Past Medical History  Diagnosis Date  . Dyspnea   . Pulmonary embolism   . Breast cyst     right  . Hypothyroidism   . Renal cyst   . Leukocytopenia   . Monoclonal gammopathy   . Osteoarthritis   . Schizoaffective disorder   . Wears dentures   . Pulmonary hypertension   . Hypertension     pulmonary     Assessment: 73 yoF with schizoeffective disorder and bipolar, pulmonary hypertension on warfarin PTA for h/o PE 2011 presents agitated and violent.  Pharmacy consulted to manage warfarin.  Warfarin home dose = 7.5 mg daily except 5 mg on Tues/Thursday, last dose reported 2/14  INR 1.62  Hgb/platelets 14.6/192  Goal of Therapy:  INR 2-3 Monitor platelets by anticoagulation protocol: Yes   Plan:  1.  Warfarin 10 mg po once tonight. 2.  Daily PT/INR.   Hershal Coria 10/15/2013,6:36 PM

## 2013-10-15 NOTE — ED Notes (Signed)
Pt from home per EMS.  Pt has not been taking her meds since yesterday.  Pt schizo effective and bipolar.  Family states she has been agitated and violent with daughter.  Psychiatrist changed meds about 2 weeks ago.  Pt doesn't like feeling from meds so she is refusing.  Pt is alert and oriented.  Vitals:  148/94.  Pt has pulmonary hypertension and blood clots in lungs.  Pt is supposed to be on coumadin.  Pt did takes yesterday morning coumadin.  Pt found with feces on hands after using restroom and wiping with hand.

## 2013-10-15 NOTE — ED Notes (Signed)
PA made aware of inability to obtain EKG due to pt still being  combative

## 2013-10-15 NOTE — ED Notes (Signed)
She is very very agitated and fighting [name] and attempting to get up and taking her o2 off, etc.  She is quite profane and very loud.  A second daughter has visited her also.  We have just medicated her for these issues.  I have just given report to Amy, RN in ICU; and will transport her shortly.

## 2013-10-15 NOTE — ED Notes (Signed)
She continues to be intermittently very agitated; and police remain in attendance.  She refuses to keep [facility] hugger in place also, despite our repeated attempts to replace it.

## 2013-10-15 NOTE — ED Notes (Signed)
Pt refused in and out cath multiple times

## 2013-10-15 NOTE — ED Notes (Signed)
Pt still refusing to allow me to preform ekg pt refuses to lie still in bed

## 2013-10-15 NOTE — ED Provider Notes (Signed)
CSN: [id number]     Arrival date & time 10/15/13  1015 History   First MD Initiated Contact with Patient 10/15/13 1028     Chief Complaint  Patient presents with  . Medical Clearance     (Consider location/radiation/quality/duration/timing/severity/associated sxs/prior Treatment) HPI Comments: Michaela Horton is a 73 year-old female with a past medical history of Hypothyroidism, Bipolar, Schizoaffective Disorder, Subdural hematoma, PE, pulmonary hypertention, presenting the Emergency Department with a chief complaint of change in behavior.  The patient's daughter reports the patient lives alone and has been "messing around with her home medication" and non-compliant with her Home oxygen regimen, 4L [place].  The patient's daughter reports she states "I'd be better off dead" yesterday. The patient's daughter reports she took medication in the morning but did not take her bedtime medication. Per [employer] the patient was found without pants on and feces on her hands. PCP: Gerrit Heck, MD   The history is provided by the patient, medical records and a relative. The history is limited by the condition of the patient. No language interpreter was used.    Past Medical History  Diagnosis Date  . Dyspnea   . Pulmonary embolism   . Breast cyst     right  . Hypothyroidism   . Renal cyst   . Leukocytopenia   . Monoclonal gammopathy   . Osteoarthritis   . Schizoaffective disorder   . Wears dentures   . Pulmonary hypertension   . Hypertension     pulmonary   Past Surgical History  Procedure Laterality Date  . Tubal ligation  1970  . Tubal ligation    . Breast surgery      cyst   Family History  Problem Relation Age of Onset  . Cancer Father     PROSTATE   History  Substance Use Topics  . Smoking status: Never Smoker   . Smokeless tobacco: Never Used  . Alcohol Use: No   OB History   Grav Para Term Preterm Abortions TAB SAB Ect Mult Living                 Review of Systems   Unable to perform ROS: Mental status change      Allergies  Review of patient's allergies indicates no known allergies.  Home Medications   Current Outpatient Rx  Name  Route  Sig  Dispense  Refill  . ADEMPAS 2.5 MG TABS   Oral   Take 2.5 mg by mouth 3 (three) times daily.          . B Complex-C (B-COMPLEX WITH VITAMIN C) tablet   Oral   Take 1 tablet by mouth daily.          . busPIRone (BUSPAR) 5 MG tablet   Oral   Take 5 mg by mouth 2 (two) times daily.         . calcium-vitamin D (OSCAL WITH D) 500-200 MG-UNIT per tablet   Oral   Take 1 tablet by mouth 2 (two) times daily.          . cycloSPORINE (RESTASIS) 0.05 % ophthalmic emulsion   Both Eyes   Place 1 drop into both eyes every 12 (twelve) hours.          Marland Kitchen lamoTRIgine (LAMICTAL) 100 MG tablet   Oral   Take 100 mg by mouth at bedtime.          Marland Kitchen levothyroxine (SYNTHROID, LEVOTHROID) 112 MCG tablet   Oral   Take  112 mcg by mouth daily before breakfast.          . LORazepam (ATIVAN) 1 MG tablet   Oral   Take 1 mg by mouth at bedtime.         . Macitentan (OPSUMIT) 10 MG TABS   Oral   Take 10 mg by mouth daily.          . Multiple Vitamin (MULTIVITAMIN WITH MINERALS) TABS tablet   Oral   Take 1 tablet by mouth every morning.         Marland Kitchen OLANZapine (ZYPREXA) 7.5 MG tablet   Oral   Take 7.5 mg by mouth at bedtime.         Marland Kitchen spironolactone (ALDACTONE) 50 MG tablet   Oral   Take 50 mg by mouth daily.          . temazepam (RESTORIL) 15 MG capsule   Oral   Take 30 mg by mouth at bedtime.          . vitamin C (ASCORBIC ACID) 500 MG tablet   Oral   Take 500 mg by mouth daily.          Marland Kitchen warfarin (COUMADIN) 5 MG tablet   Oral   Take 5-7.5 mg by mouth one time only at 6 PM. 5 mg Tuesday and Thursday, 7.5 mg Monday, Wednesday, Friday, Saturday, and sunday         . furosemide (LASIX) 20 MG tablet   Oral   Take 20 mg by mouth daily as needed for fluid.          Marland Kitchen  lidocaine (LIDODERM) 5 %   Transdermal   Place 1 patch onto the skin daily as needed.           BP 125/78  Pulse 82  Temp(Src) 97.6 F (36.4 C) (Oral)  Resp 16  SpO2 99% Physical Exam  Nursing note and vitals reviewed. Constitutional: She appears well-developed and well-nourished.  HENT:  Head: Normocephalic and atraumatic.  Eyes: EOM are normal. Pupils are equal, round, and reactive to light.  Neck: Normal range of motion. Neck supple.  Cardiovascular: Normal rate and regular rhythm.   Murmur heard. No lower extremity edema.  Pulmonary/Chest: Tachypnea noted. No respiratory distress. She has decreased breath sounds.  Abdominal: Soft. There is no tenderness. There is no rigidity, no rebound and no guarding.  Neurological: She is alert. GCS eye subscore is 4. GCS verbal subscore is 5. GCS motor subscore is 6.  Skin: Skin is warm, dry and intact. No rash noted.  Psychiatric: Her affect is angry. She is aggressive and combative.  Non communicative on initial evaluation. Re-evaluation GCS 15.    ED Course  Procedures (including critical care time) Labs Review Labs Reviewed  CBC - Abnormal; Notable for the following:    WBC 3.4 (*)    All other components within normal limits  COMPREHENSIVE METABOLIC PANEL - Abnormal; Notable for the following:    Creatinine, Ser 1.23 (*)    GFR calc non Af Amer 43 (*)    GFR calc Af Amer 50 (*)    All other components within normal limits  BLOOD GAS, ARTERIAL - Abnormal; Notable for the following:    pO2, Arterial 55.9 (*)    All other components within normal limits  SALICYLATE LEVEL - Abnormal; Notable for the following:    Salicylate Lvl <0.1 (*)    All other components within normal limits  PROTIME-INR - Abnormal; Notable for  the following:    Prothrombin Time 18.8 (*)    INR 1.62 (*)    All other components within normal limits  ETHANOL  TROPONIN I  ACETAMINOPHEN LEVEL  URINALYSIS, ROUTINE W REFLEX MICROSCOPIC  URINE RAPID  DRUG SCREEN (HOSP PERFORMED)   Imaging Review Dg Chest Port 1 View  10/15/2013   CLINICAL DATA:  ams  EXAM: PORTABLE CHEST - 1 VIEW  COMPARISON:  DG BONE SURVEY MET dated 07/19/2013; DG CHEST 2V dated 03/30/2013  FINDINGS: Low lung volumes. Cardiac silhouette is enlarged. It was tortuous and ectatic. There is prominence of the interstitial markings. Is not appear to be significant peribronchial cuffing. There are areas concerning for mild bronchiectasis within the right middle lobe and lingula regions. The osseous structures demonstrate no acute abnormalities.  IMPRESSION: Low lung volumes which accentuate the pulmonary findings. There is not appear to be evidence of acute cardiopulmonary disease. There are interstitial changes likely changes.   Electronically Signed   By: Margaree Mackintosh M.D.   On: 10/15/2013 12:20    EKG Interpretation   None       MDM   Final diagnoses:  None   Pt with a history of schizoaffective disorder.  She is aggressive and uncooperative on exam.  Discussed patient history and status with Dr. Alvino Chapel who advises administering Geodon and re-evaluating.  On coumadin will evaluate INR and possible spontaneous intercranial bleed. Chest XR to evaluate acute lung pathology.  ABG given history of non compliance of home oxygen.  Ethanol, acetaminophen, drug screen ordered given history of AMS. Staff unable to get EKG, Labs drawn or XR due to patient moving despite restraints and geodon.  ABG: PO2 55, hypoxic.  Re-eval: [name] at bedside. Discussed PO2 results with the pt's daughter, and need for medical admission prior to [facility] evaluation and admission. 1610: Re-eval pt remains abrasive to providers and daughter.  Discussed plan with the patient's daughter. Dr. Alvino Chapel canceled CT due to decreased suspicion of intercranial hemmorage.  Pt refusing to give UA and urinated in the bed.  In and Out as needed. Dr. Clydene Laming will admit her for further management of  the patient.   Meds given in ED:  Medications  ziprasidone (GEODON) injection 10 mg (10 mg Intramuscular Given 10/15/13 1119)  sterile water (preservative free) injection (  Duplicate 02/13/06 3710)  ziprasidone (GEODON) injection 10 mg (10 mg Intramuscular Given 10/15/13 1236)  LORazepam (ATIVAN) injection 1 mg (1 mg Intravenous Given 10/15/13 1345)    New Prescriptions   No medications on file      Lorrine Kin, PA-C 10/17/13 0024

## 2013-10-15 NOTE — ED Notes (Signed)
He daughter phoned EMS to report pt. "out of control".  She further told EMS that pt. Has seen a psychiatrist recently, who "changed some of her meds".  She is quite loud and agitated upon arrival to ED, however, after about 10 min. She has settled down and is now awake, looking around and in bed.  She is in no distress.  Two [employer] officers remain at her bedside.

## 2013-10-15 NOTE — ED Notes (Signed)
Bed: KP22 Expected date: 10/15/13 Expected time: 10:15 AM Means of arrival:  Comments: Med clearance

## 2013-10-15 NOTE — ED Notes (Signed)
She remains quite agitated and combative and profane.  IV inserted/ativan given per order of Dr. Alvino Chapel.  We thus far have been unable to perform her diagnostics, other than her blood tests.

## 2013-10-15 NOTE — H&P (Signed)
[facility] History and Physical  Michaela Horton HUT:[id number] DOB: 07-Jul-1941 DOA: 10/15/2013  Referring physician:  PCP: Gerrit Heck, MD  Specialists:   Chief Complaint: Altered mental status, drug overdose?  HPI: Michaela Horton is a 73 y.o. yo BF PMHx  schizoaffective disorder, bipolar disorder, chronic thromboembolic pulmonary hypertension, chronic respiratory failure, hypothyroidism, Hx SDH, PE. Presented to emergency department altered mental status. The patient's daughter reports the patient lives alone and has been "messing around with her home medication" and non-compliant with her Home oxygen regimen, 4L [place]. The patient's daughter reports she states "I'd be better off dead" yesterday. The patient's daughter reports she took medication in the morning but did not take her bedtime medication. Per [employer] the patient was found without pants on and feces on her hands. UDS negative (should be positive for benzos closed). Pt was given Geodon 30 mg IM + Ativan 3 mg in the ED I/O to calm Pt. Upon arrival in ED patient was in 4. restraints and still requiring 4 people to safely restrained patient secondary to patient biting, spitting, kicking, scratching. Patient was unwilling or unable to answer questions directly concerning medication types last dosing.      Review of Systems: Unable to complete secondary to patient's altered mental status     TRAVEL HISTORY:   Procedure   Antibiotics   Consultation    Past Medical History  Diagnosis Date  . Dyspnea   . Pulmonary embolism   . Breast cyst     right  . Hypothyroidism   . Renal cyst   . Leukocytopenia   . Monoclonal gammopathy   . Osteoarthritis   . Schizoaffective disorder   . Wears dentures   . Pulmonary hypertension   . Hypertension     pulmonary   Past Surgical History  Procedure Laterality Date  . Tubal ligation  1970  . Tubal ligation    . Breast surgery      cyst   Social History:  reports  that she has never smoked. She has never used smokeless tobacco. She reports that she does not drink alcohol or use illicit drugs. where does patient live--home, ALF, SNF? Lives at home alone with supervision from her daughter's   Can patient participate in ADLs? No  No Known Allergies  Family History  Problem Relation Age of Onset  . Cancer Father     PROSTATE    Prior to Admission medications   Medication Sig Start Date End Date Taking? Authorizing Provider  ADEMPAS 2.5 MG TABS Take 2.5 mg by mouth 3 (three) times daily.  12/28/12  Yes Historical Provider, MD  B Complex-C (B-COMPLEX WITH VITAMIN C) tablet Take 1 tablet by mouth daily.    Yes Historical Provider, MD  busPIRone (BUSPAR) 5 MG tablet Take 5 mg by mouth 2 (two) times daily.   Yes Historical Provider, MD  calcium-vitamin D (OSCAL WITH D) 500-200 MG-UNIT per tablet Take 1 tablet by mouth 2 (two) times daily.    Yes Historical Provider, MD  cycloSPORINE (RESTASIS) 0.05 % ophthalmic emulsion Place 1 drop into both eyes every 12 (twelve) hours.  11/27/12  Yes Historical Provider, MD  lamoTRIgine (LAMICTAL) 100 MG tablet Take 100 mg by mouth at bedtime.    Yes Historical Provider, MD  levothyroxine (SYNTHROID, LEVOTHROID) 112 MCG tablet Take 112 mcg by mouth daily before breakfast.  10/09/10  Yes Historical Provider, MD  LORazepam (ATIVAN) 1 MG tablet Take 1 mg by mouth at bedtime.   Yes Historical  Provider, MD  Macitentan (OPSUMIT) 10 MG TABS Take 10 mg by mouth daily.    Yes Historical Provider, MD  Multiple Vitamin (MULTIVITAMIN WITH MINERALS) TABS tablet Take 1 tablet by mouth every morning.   Yes Historical Provider, MD  OLANZapine (ZYPREXA) 7.5 MG tablet Take 7.5 mg by mouth at bedtime.   Yes Historical Provider, MD  spironolactone (ALDACTONE) 50 MG tablet Take 50 mg by mouth daily.  04/17/13 04/17/14 Yes Historical Provider, MD  temazepam (RESTORIL) 15 MG capsule Take 30 mg by mouth at bedtime.    Yes Historical Provider, MD   vitamin C (ASCORBIC ACID) 500 MG tablet Take 500 mg by mouth daily.    Yes Historical Provider, MD  warfarin (COUMADIN) 5 MG tablet Take 5-7.5 mg by mouth one time only at 6 PM. 5 mg Tuesday and Thursday, 7.5 mg Monday, Wednesday, Friday, Saturday, and sunday 07/07/13  Yes Historical Provider, MD  furosemide (LASIX) 20 MG tablet Take 20 mg by mouth daily as needed for fluid.     Historical Provider, MD  lidocaine (LIDODERM) 5 % Place 1 patch onto the skin daily as needed.  07/14/13   Historical Provider, MD   Physical Exam: Filed Vitals:   10/15/13 1030 10/15/13 1451 10/15/13 1455  BP: 181/99 125/78   Pulse: 86 82   Temp: 95.9 F (35.5 C)  97.6 F (36.4 C)  TempSrc: Rectal  Oral  Resp: 20 16   SpO2: 96% 99%      General:  A./O. x1, combative, belligerent, attempting to bite kick and spit on staff  Eyes: Pupils equal react to light and accommodation  Neck: Negative JVD, negative lymphadenopathy  Cardiovascular: Tachycardic, regular rhythm, negative murmurs rubs gallops  Respiratory: Clear to auscultation bilateral  Abdomen: Soft, nontender, nondistended, plus bowel sounds  Skin: Negative lesions rashes cuts,  Musculoskeletal: Negative pedal edema  Psychiatric: Patient screaming, refusing to answer questions, biting kicking spitting at medical personnel. Screaming to be released and allowed to leave.  Neurologic: Unable to complete secondary to patient's altered mental status.   Labs on Admission:  Basic Metabolic Panel:  Recent Labs Lab 10/15/13 1205  NA 140  K 4.1  CL 105  CO2 23  GLUCOSE 95  BUN 21  CREATININE 1.23*  CALCIUM 9.3   Liver Function Tests:  Recent Labs Lab 10/15/13 1205  AST 26  ALT 20  ALKPHOS 46  BILITOT 0.5  PROT 7.9  ALBUMIN 3.7   No results found for this basename: LIPASE, AMYLASE,  in the last 168 hours No results found for this basename: AMMONIA,  in the last 168 hours CBC:  Recent Labs Lab 10/15/13 1205  WBC 3.4*  HGB  14.6  HCT 43.2  MCV 93.7  PLT 192   Cardiac Enzymes:  Recent Labs Lab 10/15/13 1205  TROPONINI <0.30    BNP (last 3 results) No results found for this basename: PROBNP,  in the last 8760 hours CBG: No results found for this basename: GLUCAP,  in the last 168 hours  Radiological Exams on Admission: Dg Chest Port 1 View  10/15/2013   CLINICAL DATA:  ams  EXAM: PORTABLE CHEST - 1 VIEW  COMPARISON:  DG BONE SURVEY MET dated 07/19/2013; DG CHEST 2V dated 03/30/2013  FINDINGS: Low lung volumes. Cardiac silhouette is enlarged. It was tortuous and ectatic. There is prominence of the interstitial markings. Is not appear to be significant peribronchial cuffing. There are areas concerning for mild bronchiectasis within the right middle lobe and  lingula regions. The osseous structures demonstrate no acute abnormalities.  IMPRESSION: Low lung volumes which accentuate the pulmonary findings. There is not appear to be evidence of acute cardiopulmonary disease. There are interstitial changes likely changes.   Electronically Signed   By: Margaree Mackintosh M.D.   On: 10/15/2013 12:20    EKG: Sinus rhythm, prolonged QT interval, patient has ST-T wave changes V1 through V4 cannot rule out anterior septal infarct vs ischemia  Assessment/Plan Active Problems:   HYPOTHYROIDISM   SCHIZOAFFECTIVE DISORDER   Chronic thromboembolic pulmonary hypertension   Chronic respiratory failure   Bipolar disorder   Altered mental state   Drug overdose   Drug overdose -UDS shows no drug present to include benzodiazepines which patient is prescribed. -Lamictal level pending -Ethanol level pending -Serum osmolality/urine osmolality pending  Schizoaffective disorder -Patient has received 30 mg Geodon IM, consult with pharmacist Thy and she recommended that no more than an additional 10 mg IM be administered if required (Geodon 40 mg/day maximum).  -Ativan 1-4 mg IV q.6 hr PRN agitation    Bipolar -Geodon 30 mg  IM/day emergency department physician or psychiatrist will need to be paged to administer -Consult psychiatry in the a.m.  Altered mental status -Most likely secondary to 1, 2, 3 however given patient's age and risk factors will need to rule out CVA. Obtain MRI/MRA -Obtain blood culture -Obtain urine culture -Obtain TSH/free T4  Chronic diastolic CHF -Daily a.m. Weights -Strict I&O -Troponin x3 -ProBNP -Echocardiogram   Chronic thromboembolic pulmonary hypertension -Patient on Adempas(vasodilator;NO sensitizer) and Opsumit (endothelin receptor antagonist). -Spoke with Dr. Frederik Pear (pulmonologist) with concern that patient may go into fulminant heart failure with out these medications, he assured me that with PO, NO sensitizers he did not have to worry about fulminant heart failure when withdrawn abruptly, as with patient on pump.  -Patient currently subtherapeutic on Coumadin. Admission INR= 1.62 -Coumadin per pharmacy  Chronic respiratory failure -Continue patient on home regimen of 4 L via [place]  Hypothyroidism - patient's home Synthroid dose is 190mg daily PO, will convert to 65m IV   Code Status: Full code Family Communication: Spoke with Ms. ToErin [phone]) concerning plan of care. Patient also has another daughter ChAsencion [phone] Plan: Resolution altered metal status  Time spent: 120 minute WOAllie Bossierriad Hospitalists Pager [phone] 7PM-7AM, please contact night-coverage [email or web address] Password [email or web address]/15/2015, 7:24 PM

## 2013-10-16 ENCOUNTER — Observation Stay (HOSPITAL_COMMUNITY): Payer: Medicare Other

## 2013-10-16 ENCOUNTER — Encounter (HOSPITAL_COMMUNITY): Payer: Self-pay

## 2013-10-16 DIAGNOSIS — F259 Schizoaffective disorder, unspecified: Secondary | ICD-10-CM

## 2013-10-16 DIAGNOSIS — I2789 Other specified pulmonary heart diseases: Secondary | ICD-10-CM

## 2013-10-16 DIAGNOSIS — I2699 Other pulmonary embolism without acute cor pulmonale: Secondary | ICD-10-CM

## 2013-10-16 LAB — CBC WITH DIFFERENTIAL/PLATELET
Basophils Absolute: 0 10*3/uL (ref 0.0–0.1)
Basophils Relative: 1 % (ref 0–1)
EOS ABS: 0.1 10*3/uL (ref 0.0–0.7)
EOS PCT: 3 % (ref 0–5)
HEMATOCRIT: 41 % (ref 36.0–46.0)
Hemoglobin: 13.5 g/dL (ref 12.0–15.0)
Lymphocytes Relative: 30 % (ref 12–46)
Lymphs Abs: 1 10*3/uL (ref 0.7–4.0)
MCH: 31.5 pg (ref 26.0–34.0)
MCHC: 32.9 g/dL (ref 30.0–36.0)
MCV: 95.6 fL (ref 78.0–100.0)
MONOS PCT: 11 % (ref 3–12)
Monocytes Absolute: 0.4 10*3/uL (ref 0.1–1.0)
NEUTROS PCT: 55 % (ref 43–77)
Neutro Abs: 1.8 10*3/uL (ref 1.7–7.7)
Platelets: 214 10*3/uL (ref 150–400)
RBC: 4.29 MIL/uL (ref 3.87–5.11)
RDW: 15 % (ref 11.5–15.5)
WBC: 3.3 10*3/uL — AB (ref 4.0–10.5)

## 2013-10-16 LAB — OSMOLALITY: OSMOLALITY: 296 mosm/kg (ref 275–300)

## 2013-10-16 LAB — COMPREHENSIVE METABOLIC PANEL
ALBUMIN: 3.1 g/dL — AB (ref 3.5–5.2)
ALK PHOS: 43 U/L (ref 39–117)
ALT: 18 U/L (ref 0–35)
AST: 27 U/L (ref 0–37)
BUN: 14 mg/dL (ref 6–23)
CHLORIDE: 109 meq/L (ref 96–112)
CO2: 26 meq/L (ref 19–32)
CREATININE: 1.16 mg/dL — AB (ref 0.50–1.10)
Calcium: 9.5 mg/dL (ref 8.4–10.5)
GFR, EST AFRICAN AMERICAN: 54 mL/min — AB (ref >90)
GFR, EST NON AFRICAN AMERICAN: 46 mL/min — AB (ref >90)
GLUCOSE: 91 mg/dL (ref 70–99)
POTASSIUM: 4.7 meq/L (ref 3.7–5.3)
Sodium: 142 mEq/L (ref 137–147)
Total Bilirubin: 0.4 mg/dL (ref 0.3–1.2)
Total Protein: 7 g/dL (ref 6.0–8.3)

## 2013-10-16 LAB — TROPONIN I
Troponin I: <0.3 ng/mL (ref <0.30)
Troponin I: <0.3 ng/mL (ref <0.30)

## 2013-10-16 LAB — HEMOGLOBIN A1C
HEMOGLOBIN A1C: 6 % — AB (ref <5.7)
Mean Plasma Glucose: 126 mg/dL — ABNORMAL HIGH (ref <117)

## 2013-10-16 LAB — MAGNESIUM: MAGNESIUM: 1.9 mg/dL (ref 1.5–2.5)

## 2013-10-16 LAB — TSH: TSH: 1.931 u[IU]/mL (ref 0.350–4.500)

## 2013-10-16 LAB — OSMOLALITY, URINE: OSMOLALITY UR: 613 mosm/kg (ref 390–1090)

## 2013-10-16 LAB — PROTIME-INR
INR: 2 — ABNORMAL HIGH (ref 0.00–1.49)
PROTHROMBIN TIME: 22.1 s — AB (ref 11.6–15.2)

## 2013-10-16 MED ORDER — LEVOTHYROXINE SODIUM 112 MCG PO TABS
112.0000 ug | ORAL_TABLET | Freq: Every day | ORAL | Status: DC
  Administered 2013-10-17 – 2013-10-18 (×2): 112 ug via ORAL
  Filled 2013-10-16 (×4): qty 1

## 2013-10-16 MED ORDER — LEVOTHYROXINE SODIUM 100 MCG IV SOLR
60.0000 ug | Freq: Every day | INTRAVENOUS | Status: DC
  Administered 2013-10-16: 60 ug via INTRAVENOUS
  Filled 2013-10-16 (×2): qty 5

## 2013-10-16 MED ORDER — HALOPERIDOL LACTATE 5 MG/ML IJ SOLN
INTRAMUSCULAR | Status: AC
  Filled 2013-10-16: qty 1

## 2013-10-16 MED ORDER — ZIPRASIDONE MESYLATE 20 MG IM SOLR: 20.0000 mg | Freq: Once | INTRAMUSCULAR | Status: DC

## 2013-10-16 MED ORDER — WARFARIN SODIUM 7.5 MG PO TABS
7.5000 mg | ORAL_TABLET | Freq: Once | ORAL | Status: DC
  Filled 2013-10-16: qty 1

## 2013-10-16 MED ORDER — ZIPRASIDONE MESYLATE 20 MG IM SOLR
20.0000 mg | Freq: Once | INTRAMUSCULAR | Status: AC
  Administered 2013-10-16: 20 mg via INTRAMUSCULAR
  Filled 2013-10-16: qty 20

## 2013-10-16 NOTE — Consult Note (Signed)
Name: Michaela Horton MRN: [id number] DOB: 11-04-1940    ADMISSION DATE:  10/15/2013 CONSULTATION DATE:  10/16/2013  REFERRING MD :  Sherral Hammers PRIMARY SERVICE:  [facility]  CHIEF COMPLAINT: advice regarding  [place] meds   HISTORY OF PRESENT ILLNESS:  70 yobf never smoker dx'd with PE by CTangiogram 06/24/2010 and Dx of inoperable CTEPH & PAH - Cardiac cath at [facility] in may 2012 showed RA pressure 5, PA pressure of 85 / 25, wedge pressure of 8, cardiac output was 4.2 L with an index of 2.3 and PVR of 9.640 and - Venous dopplers neg 08/19/2010  - Echocardiogram 11/28/10: Severely elevated RV sys pressure with dilated R Ht est at 99 with nl LV  -VQ 11/05/10 Mod to high prob PE > referred to [facility] 12/19/2010 And started on 02 24 h/day 2 lpm at rest , 5 lpm with activity  PFT's (minimal airflow obst with dlco =51%).  She was placed on tracclear in June 2012 with addition of tadalafil in November 2012. She was switched from tadalafil  To rociguat seventh 2014, macitentan replaced bosentan November 2014. I have reviewed last note from [facility] from December 2014. She apparently received a bill with a lot of co-pay for opsumit. She also received a letter from her insurance company asking her to change adempas to a different agent. Rocco Serene at [facility] 9 /2014 - mild RV dysfunction She states that she stopped using these medications because of the way they made her feel. She denies increasing dyspnea or cough or pedal edema but I think she refers to her mental condition. 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 She is maintained on Coumadin and  has a history of subdural hematoma in September 2014 after a fall     PAST MEDICAL HISTORY :  Past Medical History  Diagnosis Date  . Dyspnea   . Pulmonary embolism   . Breast cyst     right  . Hypothyroidism   . Renal cyst   .  Leukocytopenia   . Monoclonal gammopathy   . Osteoarthritis   . Schizoaffective disorder   . Wears dentures   . Pulmonary hypertension   . Hypertension     pulmonary   Past Surgical History  Procedure Laterality Date  . Tubal ligation  1970  . Tubal ligation    . Breast surgery      cyst   Prior to Admission medications   Medication Sig Start Date End Date Taking? Authorizing Provider  ADEMPAS 2.5 MG TABS Take 2.5 mg by mouth 3 (three) times daily.  12/28/12  Yes Historical Provider, MD  B Complex-C (B-COMPLEX WITH VITAMIN C) tablet Take 1 tablet by mouth daily.    Yes Historical Provider, MD  busPIRone (BUSPAR) 5 MG tablet Take 5 mg by mouth 2 (two) times daily.   Yes Historical Provider, MD  calcium-vitamin D (OSCAL WITH D) 500-200 MG-UNIT per tablet Take 1 tablet by mouth 2 (two) times daily.    Yes Historical Provider, MD  cycloSPORINE (RESTASIS) 0.05 % ophthalmic emulsion Place 1 drop into both eyes every 12 (twelve) hours.  11/27/12  Yes Historical Provider, MD  lamoTRIgine (LAMICTAL) 100 MG tablet Take 100 mg by mouth at bedtime.    Yes Historical Provider, MD  levothyroxine (SYNTHROID, LEVOTHROID) 112 MCG tablet Take 112 mcg by mouth daily before breakfast.  10/09/10  Yes Historical Provider,  MD  LORazepam (ATIVAN) 1 MG tablet Take 1 mg by mouth at bedtime.   Yes Historical Provider, MD  Macitentan (OPSUMIT) 10 MG TABS Take 10 mg by mouth daily.    Yes Historical Provider, MD  Multiple Vitamin (MULTIVITAMIN WITH MINERALS) TABS tablet Take 1 tablet by mouth every morning.   Yes Historical Provider, MD  OLANZapine (ZYPREXA) 7.5 MG tablet Take 7.5 mg by mouth at bedtime.   Yes Historical Provider, MD  spironolactone (ALDACTONE) 50 MG tablet Take 50 mg by mouth daily.  04/17/13 04/17/14 Yes Historical Provider, MD  temazepam (RESTORIL) 15 MG capsule Take 30 mg by mouth at bedtime.    Yes Historical Provider, MD  vitamin C (ASCORBIC ACID) 500 MG tablet Take 500 mg by mouth daily.    Yes  Historical Provider, MD  warfarin (COUMADIN) 5 MG tablet Take 5-7.5 mg by mouth one time only at 6 PM. 5 mg Tuesday and Thursday, 7.5 mg Monday, Wednesday, Friday, Saturday, and sunday 07/07/13  Yes Historical Provider, MD  furosemide (LASIX) 20 MG tablet Take 20 mg by mouth daily as needed for fluid.     Historical Provider, MD  lidocaine (LIDODERM) 5 % Place 1 patch onto the skin daily as needed.  07/14/13   Historical Provider, MD   No Known Allergies  FAMILY HISTORY:  Family History  Problem Relation Age of Onset  . Cancer Father     PROSTATE   SOCIAL HISTORY:  reports that she has never smoked. She has never used smokeless tobacco. She reports that she does not drink alcohol or use illicit drugs.  REVIEW OF SYSTEMS:   Constitutional: Negative for fever, chills, weight loss, malaise/fatigue and diaphoresis.  HENT: Negative for hearing loss, ear pain, nosebleeds, congestion, sore throat, neck pain, tinnitus and ear discharge.   Eyes: Negative for blurred vision, double vision, photophobia, pain, discharge and redness.  Respiratory: Negative for cough, hemoptysis, sputum production, shortness of breath, wheezing and stridor.   Cardiovascular: Negative for chest pain, palpitations, orthopnea, claudication, leg swelling and PND.  Gastrointestinal: Negative for heartburn, nausea, vomiting, abdominal pain, diarrhea, constipation, blood in stool and melena.  Genitourinary: Negative for dysuria, urgency, frequency, hematuria and flank pain.  Musculoskeletal: Negative for myalgias, back pain, joint pain and falls.  Skin: Negative for itching and rash.  Neurological: Negative for dizziness, tingling, tremors, sensory change, speech change, focal weakness, seizures, loss of consciousness, weakness and headaches.  Endo/Heme/Allergies: Negative for environmental allergies and polydipsia. Does not bruise/bleed easily.  SUBJECTIVE:   VITAL SIGNS: Temp:  [97.5 F (36.4 C)-98.1 F (36.7 C)] 98.1  F (36.7 C) (02/16 0800) Pulse Rate:  [64-90] 70 (02/16 0800) Resp:  [16-32] 23 (02/16 0800) BP: (86-140)/(47-90) 110/67 mmHg (02/16 0800) SpO2:  [94 %-100 %] 99 % (02/16 0800) Weight:  [149 lb 0.5 oz (67.6 kg)] 149 lb 0.5 oz (67.6 kg) (02/15 2000)  PHYSICAL EXAMINATION: [place]. [place], well-nourished, in no distress, normal affect ENT - no lesions, no post nasal drip Neck: No JVD, no thyromegaly, no carotid bruits Lungs: no use of accessory muscles, no dullness to percussion, clear without rales or rhonchi  Cardiovascular: Rhythm regular, s1 nml, loud P2, no murmurs, no peripheral edema Abdomen: soft and non-tender, no hepatosplenomegaly, BS normal. Musculoskeletal: No deformities, no cyanosis or clubbing Neuro:  alert, non focal Skin:  Warm, no lesions/ rash    Recent Labs Lab 10/15/13 1205 10/16/13 0555  NA 140 142  K 4.1 4.7  CL 105 109  CO2 23  26  BUN 21 14  CREATININE 1.23* 1.16*  GLUCOSE 95 91    Recent Labs Lab 10/15/13 1205 10/16/13 0555  HGB 14.6 13.5  HCT 43.2 41.0  WBC 3.4* 3.3*  PLT 192 214   Mr [facility] Contrast  10/16/2013   CLINICAL DATA:  Recent onset of confusion. Patient is anticoagulated. Stroke risk factors include hypertension.  EXAM: MRI HEAD WITHOUT CONTRAST  MRA HEAD WITHOUT CONTRAST  TECHNIQUE: Multiplanar, multiecho pulse sequences of the brain and surrounding structures were obtained without intravenous contrast. Angiographic images of the head were obtained using MRA technique without contrast.  COMPARISON:  DG CERVICAL SPINE 2-3 VIEWS dated 08/02/2013; CT HEAD W/O CM dated 06/07/2013; CT HEAD W/O CM dated 05/01/2013; CT HEAD W/O CM dated 04/29/2013  FINDINGS: MRI HEAD FINDINGS  No evidence for acute infarction, hemorrhage, mass lesion, hydrocephalus, or extra-axial fluid. Moderate cerebral and cerebellar atrophy. Chronic microvascular ischemic change affects the periventricular and subcortical white matter. No large vessel infarct. No significant  lacunar infarct. Flow voids are maintained throughout the carotid, basilar, and vertebral arteries. There are no areas of chronic hemorrhage. Pituitary and cerebellar tonsils unremarkable. Mild cervical spondylosis. No osseous lesions. Visualized calvarium, skull base, and upper cervical osseous structures unremarkable. Scalp and extracranial soft tissues, orbits, sinuses, and mastoids show no acute process.  MRA HEAD FINDINGS  The internal carotid arteries are widely patent. The basilar artery is widely patent with vertebrals codominant. The anterior, middle, and posterior cerebral arteries are widely patent. There is no cerebellar branch occlusion. There is moderate dolichoectasia consistent with chronic hypertension. Shallow outpouching from the inferior cavernous segment right ICA represents minor atheromatous aneurysmal dilatation. There is no intracranial stenosis or berry aneurysm.  IMPRESSION: Chronic changes as described.  No acute intracranial abnormality.  Dolichoectasia intracranial vascular structures without proximal stenosis.   Electronically Signed   By: Davonna Belling M.D.   On: 10/16/2013 09:21   Mr Brain Wo Contrast  10/16/2013   CLINICAL DATA:  Recent onset of confusion. Patient is anticoagulated. Stroke risk factors include hypertension.  EXAM: MRI HEAD WITHOUT CONTRAST  MRA HEAD WITHOUT CONTRAST  TECHNIQUE: Multiplanar, multiecho pulse sequences of the brain and surrounding structures were obtained without intravenous contrast. Angiographic images of the head were obtained using MRA technique without contrast.  COMPARISON:  DG CERVICAL SPINE 2-3 VIEWS dated 08/02/2013; CT HEAD W/O CM dated 06/07/2013; CT HEAD W/O CM dated 05/01/2013; CT HEAD W/O CM dated 04/29/2013  FINDINGS: MRI HEAD FINDINGS  No evidence for acute infarction, hemorrhage, mass lesion, hydrocephalus, or extra-axial fluid. Moderate cerebral and cerebellar atrophy. Chronic microvascular ischemic change affects the periventricular and  subcortical white matter. No large vessel infarct. No significant lacunar infarct. Flow voids are maintained throughout the carotid, basilar, and vertebral arteries. There are no areas of chronic hemorrhage. Pituitary and cerebellar tonsils unremarkable. Mild cervical spondylosis. No osseous lesions. Visualized calvarium, skull base, and upper cervical osseous structures unremarkable. Scalp and extracranial soft tissues, orbits, sinuses, and mastoids show no acute process.  MRA HEAD FINDINGS  The internal carotid arteries are widely patent. The basilar artery is widely patent with vertebrals codominant. The anterior, middle, and posterior cerebral arteries are widely patent. There is no cerebellar branch occlusion. There is moderate dolichoectasia consistent with chronic hypertension. Shallow outpouching from the inferior cavernous segment right ICA represents minor atheromatous aneurysmal dilatation. There is no intracranial stenosis or berry aneurysm.  IMPRESSION: Chronic changes as described.  No acute intracranial abnormality.  Dolichoectasia intracranial vascular  structures without proximal stenosis.   Electronically Signed   By: Rolla Flatten M.D.   On: 10/16/2013 09:21   Dg Chest Port 1 View  10/15/2013   CLINICAL DATA:  ams  EXAM: PORTABLE CHEST - 1 VIEW  COMPARISON:  DG BONE SURVEY MET dated 07/19/2013; DG CHEST 2V dated 03/30/2013  FINDINGS: Low lung volumes. Cardiac silhouette is enlarged. It was tortuous and ectatic. There is prominence of the interstitial markings. Is not appear to be significant peribronchial cuffing. There are areas concerning for mild bronchiectasis within the right middle lobe and lingula regions. The osseous structures demonstrate no acute abnormalities.  IMPRESSION: Low lung volumes which accentuate the pulmonary findings. There is not appear to be evidence of acute cardiopulmonary disease. There are interstitial changes likely changes.   Electronically Signed   By: Margaree Mackintosh M.D.   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 She states this is because of the mental changes that it caused. I was able to reason with her that her mental changes may have been more likely due to her psychiatric medication such as lorazepam, buspirone & olanzapine that have been added by psychiatry. She is willing to go back on bosentan and tadalafil. I would suggest it be resumed at the [facility] he'll for now until the psychiatric issues are sorted out. She can then be discharged on bosentan and tadalafil  until she is evaluated at [facility].  Oxygen as be continued Lasix 40 mg per day and Aldactone can be continued We may have to reconsider use of Coumadin if she has more falls   Rigoberto Noel  [facility] [place] Pager: [phone]  10/16/2013, 11:34 AM

## 2013-10-16 NOTE — Consult Note (Signed)
[name] Face-to-Face Psychiatry Consult   Reason for Consult:  Medication management for her schizoaffective disorder Referring Physician:  [name] is an 73 y.o. female. Total Time spent with patient: 20 minutes  Assessment: AXIS I:  Schizoaffective Disorder AXIS II:  Deferred AXIS III:   Past Medical History  Diagnosis Date  . Dyspnea   . Pulmonary embolism   . Breast cyst     right  . Hypothyroidism   . Renal cyst   . Leukocytopenia   . Monoclonal gammopathy   . Osteoarthritis   . Schizoaffective disorder   . Wears dentures   . Pulmonary hypertension   . Hypertension     pulmonary   AXIS IV:  problems related to social environment and problems with primary support group AXIS V:  11-20 some danger of hurting self or others possible OR occasionally fails to maintain minimal personal hygiene OR gross impairment in communication  Plan:  Recommend psychiatric Inpatient admission when medically cleared.  Subjective:   Michaela Horton is a 73 y.o. female patient admitted with change in mental status and possible drug overdose .    HPI:  Patient seen chart reviewed.  Patient was admitted to ICU because of change mental status.  The patient's daughter call the ambulance because she was "messing around with the medication".  As per daughter patient has been noncompliant with medication and even mention " I would be better off dead".  As per staff patient was very combative and disorganized in giving Geodon and Ativan to calm her down.  Patient remains very disorganized, agitated irritable and labile.  Her UDS is negative however it should be positive for benzos.  She's been seeing Dr. Jeb Levering but do not remember her medication.  Patient was very difficult to understand.  She required 4. restraint in the emergency room.  She was trying to kick, scratch and bitting people.  She was very upset on her daughter because she believes she has no psychiatric illness.  Patient told that  she moved from [place] in 1980 and she had a nervous breakdown but now she is doing better.  Patient appears very loose with rambling speech.  Patient requires a sitter for her safety.  Currently she is on Zyprexa. HPI Elements:   Location:  Patient is very irritable, combative and disorganized.. Quality:  Unable to function. Severity:  Moderate.  Past Psychiatric History: Past Medical History  Diagnosis Date  . Dyspnea   . Pulmonary embolism   . Breast cyst     right  . Hypothyroidism   . Renal cyst   . Leukocytopenia   . Monoclonal gammopathy   . Osteoarthritis   . Schizoaffective disorder   . Wears dentures   . Pulmonary hypertension   . Hypertension     pulmonary    reports that she has never smoked. She has never used smokeless tobacco. She reports that she does not drink alcohol or use illicit drugs. Family History  Problem Relation Age of Onset  . Cancer Father     PROSTATE     Living Arrangements: Alone   Abuse/Neglect [facility]) Physical Abuse: Denies Verbal Abuse: Denies Sexual Abuse: Denies Allergies:  No Known Allergies  ACT Assessment Complete:  No:   Past Psychiatric History: Diagnosis:  Schizoaffective disorder   Hospitalizations:  Yes but don't know   Outpatient Care:  Dr Emelda Brothers  Substance Abuse Care:  Unknown   Self-Mutilation:  Unknown   Suicidal Attempts:  Unknown   Homicidal  Behaviors:  Unknown    Violent Behaviors:  Yes    Place of Residence:  Lives alone Marital Status:  Unknown Employed/Unemployed:  Unknown Education:  Unknown Family Supports:  Daughter Objective: Blood pressure 110/67, pulse 70, temperature 98.3 F (36.8 C), temperature source Oral, resp. rate 23, height $RemoveBe'5\' 2"'GlCXUHnMk$  (1.575 m), weight 149 lb 0.5 oz (67.6 kg), SpO2 99.00%.Body mass index is 27.25 kg/(m^2). Results for orders placed during the hospital encounter of 10/15/13 (from the past 72 hour(s))  CBC     Status: Abnormal   Collection Time    10/15/13 12:05 PM      Result Value  Ref Range   WBC 3.4 (*) 4.0 - 10.5 K/uL   RBC 4.61  3.87 - 5.11 MIL/uL   Hemoglobin 14.6  12.0 - 15.0 g/dL   HCT 43.2  36.0 - 46.0 %   MCV 93.7  78.0 - 100.0 fL   MCH 31.7  26.0 - 34.0 pg   MCHC 33.8  30.0 - 36.0 g/dL   RDW 14.8  11.5 - 15.5 %   Platelets 192  150 - 400 K/uL  COMPREHENSIVE METABOLIC PANEL     Status: Abnormal   Collection Time    10/15/13 12:05 PM      Result Value Ref Range   Sodium 140  137 - 147 mEq/L   Potassium 4.1  3.7 - 5.3 mEq/L   Chloride 105  96 - 112 mEq/L   CO2 23  19 - 32 mEq/L   Glucose, Bld 95  70 - 99 mg/dL   BUN 21  6 - 23 mg/dL   Creatinine, Ser 1.23 (*) 0.50 - 1.10 mg/dL   Calcium 9.3  8.4 - 10.5 mg/dL   Total Protein 7.9  6.0 - 8.3 g/dL   Albumin 3.7  3.5 - 5.2 g/dL   AST 26  0 - 37 U/L   Comment: SLIGHT HEMOLYSIS   ALT 20  0 - 35 U/L   Alkaline Phosphatase 46  39 - 117 U/L   Total Bilirubin 0.5  0.3 - 1.2 mg/dL   GFR calc non Af Amer 43 (*) >90 mL/min   GFR calc Af Amer 50 (*) >90 mL/min   Comment: (NOTE)     The eGFR has been calculated using the CKD EPI equation.     This calculation has not been validated in all clinical situations.     eGFR's persistently <90 mL/min signify possible Chronic Kidney     Disease.  ETHANOL     Status: None   Collection Time    10/15/13 12:05 PM      Result Value Ref Range   Alcohol, Ethyl (B) <11  0 - 11 mg/dL   Comment:            LOWEST DETECTABLE LIMIT FOR     SERUM ALCOHOL IS 11 mg/dL     FOR MEDICAL PURPOSES ONLY  SALICYLATE LEVEL     Status: Abnormal   Collection Time    10/15/13 12:05 PM      Result Value Ref Range   Salicylate Lvl <3.6 (*) 2.8 - 20.0 mg/dL  TROPONIN I     Status: None   Collection Time    10/15/13 12:05 PM      Result Value Ref Range   Troponin I <0.30  <0.30 ng/mL   Comment:            Due to the release kinetics of cTnI,  a negative result within the first hours     of the onset of symptoms does not rule out     myocardial infarction with certainty.     If  myocardial infarction is still suspected,     repeat the test at appropriate intervals.  PROTIME-INR     Status: Abnormal   Collection Time    10/15/13 12:05 PM      Result Value Ref Range   Prothrombin Time 18.8 (*) 11.6 - 15.2 seconds   INR 1.62 (*) 0.00 - 1.49  BLOOD GAS, ARTERIAL     Status: Abnormal   Collection Time    10/15/13 12:14 PM      Result Value Ref Range   O2 Content 2.0     Delivery systems NASAL CANNULA     pH, Arterial 7.420  7.350 - 7.450   pCO2 arterial 35.0  35.0 - 45.0 mmHg   pO2, Arterial 55.9 (*) 80.0 - 100.0 mmHg   Bicarbonate 22.3  20.0 - 24.0 mEq/L   TCO2 19.3  0 - 100 mmol/L   Acid-base deficit 1.1  0.0 - 2.0 mmol/L   O2 Saturation 88.6     Patient temperature 98.6     Collection site LEFT RADIAL     Drawn by [id number]     Sample type ARTERIAL DRAW     Allens test (pass/fail) PASS  PASS  ACETAMINOPHEN LEVEL     Status: None   Collection Time    10/15/13 12:38 PM      Result Value Ref Range   Acetaminophen (Tylenol), Serum <15.0  10 - 30 ug/mL   Comment:            THERAPEUTIC CONCENTRATIONS VARY     SIGNIFICANTLY. A RANGE OF 10-30     ug/mL MAY BE AN EFFECTIVE     CONCENTRATION FOR MANY PATIENTS.     HOWEVER, SOME ARE BEST TREATED     AT CONCENTRATIONS OUTSIDE THIS     RANGE.     ACETAMINOPHEN CONCENTRATIONS     >150 ug/mL AT 4 HOURS AFTER     INGESTION AND >50 ug/mL AT 12     HOURS AFTER INGESTION ARE     OFTEN ASSOCIATED WITH TOXIC     REACTIONS.  URINALYSIS, ROUTINE W REFLEX MICROSCOPIC     Status: Abnormal   Collection Time    10/15/13  4:54 PM      Result Value Ref Range   Color, Urine YELLOW  YELLOW   APPearance CLEAR  CLEAR   Specific Gravity, Urine 1.015  1.005 - 1.030   pH 7.5  5.0 - 8.0   Glucose, UA NEGATIVE  NEGATIVE mg/dL   Hgb urine dipstick NEGATIVE  NEGATIVE   Bilirubin Urine NEGATIVE  NEGATIVE   Ketones, ur 15 (*) NEGATIVE mg/dL   Protein, ur NEGATIVE  NEGATIVE mg/dL   Urobilinogen, UA 0.2  0.0 - 1.0 mg/dL   Nitrite  NEGATIVE  NEGATIVE   Leukocytes, UA NEGATIVE  NEGATIVE   Comment: MICROSCOPIC NOT DONE ON URINES WITH NEGATIVE PROTEIN, BLOOD, LEUKOCYTES, NITRITE, OR GLUCOSE <1000 mg/dL.  URINE RAPID DRUG SCREEN (HOSP PERFORMED)     Status: None   Collection Time    10/15/13  4:54 PM      Result Value Ref Range   Opiates NONE DETECTED  NONE DETECTED   Cocaine NONE DETECTED  NONE DETECTED   Benzodiazepines NONE DETECTED  NONE DETECTED   Amphetamines NONE DETECTED  NONE DETECTED  Tetrahydrocannabinol NONE DETECTED  NONE DETECTED   Barbiturates NONE DETECTED  NONE DETECTED   Comment:            DRUG SCREEN FOR MEDICAL PURPOSES     ONLY.  IF CONFIRMATION IS NEEDED     FOR ANY PURPOSE, NOTIFY LAB     WITHIN 5 DAYS.                LOWEST DETECTABLE LIMITS     FOR URINE DRUG SCREEN     Drug Class       Cutoff (ng/mL)     Amphetamine      1000     Barbiturate      200     Benzodiazepine   093     Tricyclics       818     Opiates          300     Cocaine          300     THC              50  MRSA PCR SCREENING     Status: None   Collection Time    10/15/13  6:56 PM      Result Value Ref Range   MRSA by PCR NEGATIVE  NEGATIVE   Comment:            The GeneXpert MRSA Assay (FDA     approved for NASAL specimens     only), is one component of a     comprehensive MRSA colonization     surveillance program. It is not     intended to diagnose MRSA     infection nor to guide or     monitor treatment for     MRSA infections.  TSH     Status: None   Collection Time    10/15/13  8:18 PM      Result Value Ref Range   TSH 1.931  0.350 - 4.500 uIU/mL   Comment: Performed at [facility]  TROPONIN I     Status: None   Collection Time    10/15/13  8:18 PM      Result Value Ref Range   Troponin I <0.30  <0.30 ng/mL   Comment:            Due to the release kinetics of cTnI,     a negative result within the first hours     of the onset of symptoms does not rule out     myocardial infarction  with certainty.     If myocardial infarction is still suspected,     repeat the test at appropriate intervals.  PRO B NATRIURETIC PEPTIDE     Status: Abnormal   Collection Time    10/15/13  8:18 PM      Result Value Ref Range   Pro B Natriuretic peptide (BNP) 599.3 (*) 0 - 125 pg/mL  LACTIC ACID, PLASMA     Status: None   Collection Time    10/15/13  8:18 PM      Result Value Ref Range   Lactic Acid, Venous 1.9  0.5 - 2.2 mmol/L  ETHANOL     Status: None   Collection Time    10/15/13  8:18 PM      Result Value Ref Range   Alcohol, Ethyl (B) <11  0 - 11 mg/dL   Comment:  LOWEST DETECTABLE LIMIT FOR     SERUM ALCOHOL IS 11 mg/dL     FOR MEDICAL PURPOSES ONLY  OSMOLALITY     Status: None   Collection Time    10/15/13  8:18 PM      Result Value Ref Range   Osmolality 296  275 - 300 mOsm/kg   Comment: Performed at [facility]  TROPONIN I     Status: None   Collection Time    10/15/13 11:55 PM      Result Value Ref Range   Troponin I <0.30  <0.30 ng/mL   Comment:            Due to the release kinetics of cTnI,     a negative result within the first hours     of the onset of symptoms does not rule out     myocardial infarction with certainty.     If myocardial infarction is still suspected,     repeat the test at appropriate intervals.  COMPREHENSIVE METABOLIC PANEL     Status: Abnormal   Collection Time    10/16/13  5:55 AM      Result Value Ref Range   Sodium 142  137 - 147 mEq/L   Potassium 4.7  3.7 - 5.3 mEq/L   Chloride 109  96 - 112 mEq/L   CO2 26  19 - 32 mEq/L   Glucose, Bld 91  70 - 99 mg/dL   BUN 14  6 - 23 mg/dL   Creatinine, Ser 1.16 (*) 0.50 - 1.10 mg/dL   Calcium 9.5  8.4 - 10.5 mg/dL   Total Protein 7.0  6.0 - 8.3 g/dL   Albumin 3.1 (*) 3.5 - 5.2 g/dL   AST 27  0 - 37 U/L   ALT 18  0 - 35 U/L   Alkaline Phosphatase 43  39 - 117 U/L   Total Bilirubin 0.4  0.3 - 1.2 mg/dL   GFR calc non Af Amer 46 (*) >90 mL/min   GFR calc Af Amer 54  (*) >90 mL/min   Comment: (NOTE)     The eGFR has been calculated using the CKD EPI equation.     This calculation has not been validated in all clinical situations.     eGFR's persistently <90 mL/min signify possible Chronic Kidney     Disease.  MAGNESIUM     Status: None   Collection Time    10/16/13  5:55 AM      Result Value Ref Range   Magnesium 1.9  1.5 - 2.5 mg/dL  CBC WITH DIFFERENTIAL     Status: Abnormal   Collection Time    10/16/13  5:55 AM      Result Value Ref Range   WBC 3.3 (*) 4.0 - 10.5 K/uL   RBC 4.29  3.87 - 5.11 MIL/uL   Hemoglobin 13.5  12.0 - 15.0 g/dL   HCT 41.0  36.0 - 46.0 %   MCV 95.6  78.0 - 100.0 fL   MCH 31.5  26.0 - 34.0 pg   MCHC 32.9  30.0 - 36.0 g/dL   RDW 15.0  11.5 - 15.5 %   Platelets 214  150 - 400 K/uL   Neutrophils Relative % 55  43 - 77 %   Neutro Abs 1.8  1.7 - 7.7 K/uL   Lymphocytes Relative 30  12 - 46 %   Lymphs Abs 1.0  0.7 - 4.0 K/uL   Monocytes Relative  11  3 - 12 %   Monocytes Absolute 0.4  0.1 - 1.0 K/uL   Eosinophils Relative 3  0 - 5 %   Eosinophils Absolute 0.1  0.0 - 0.7 K/uL   Basophils Relative 1  0 - 1 %   Basophils Absolute 0.0  0.0 - 0.1 K/uL  TROPONIN I     Status: None   Collection Time    10/16/13  5:55 AM      Result Value Ref Range   Troponin I <0.30  <0.30 ng/mL   Comment:            Due to the release kinetics of cTnI,     a negative result within the first hours     of the onset of symptoms does not rule out     myocardial infarction with certainty.     If myocardial infarction is still suspected,     repeat the test at appropriate intervals.  PROTIME-INR     Status: Abnormal   Collection Time    10/16/13  5:55 AM      Result Value Ref Range   Prothrombin Time 22.1 (*) 11.6 - 15.2 seconds   INR 2.00 (*) 0.00 - 1.49  HEMOGLOBIN A1C     Status: Abnormal   Collection Time    10/16/13  5:55 AM      Result Value Ref Range   Hemoglobin A1C 6.0 (*) <5.7 %   Comment: (NOTE)                                                                                According to the ADA Clinical Practice Recommendations for 2011, when     HbA1c is used as a screening test:      >=6.5%   Diagnostic of Diabetes Mellitus               (if abnormal result is confirmed)     5.7-6.4%   Increased risk of developing Diabetes Mellitus     References:Diagnosis and Classification of Diabetes Mellitus,Diabetes     Care,2011,34(Suppl 1):S62-S69 and Standards of Medical Care in             Diabetes - 2011,Diabetes Care,2011,34 (Suppl 1):S11-S61.   Mean Plasma Glucose 126 (*) <117 mg/dL   Comment: Performed at [facility]  OSMOLALITY, URINE     Status: None   Collection Time    10/16/13 11:36 AM      Result Value Ref Range   Osmolality, Ur 613  390 - 1090 mOsm/kg   Comment: Performed at [facility] are reviewed.  Current Facility-Administered Medications  Medication Dose Route Frequency Provider Last Rate Last Dose  . 0.9 %  sodium chloride infusion   Intravenous Continuous Drema Dallas, MD 75 mL/hr at 10/15/13 2042 75 mL at 10/15/13 2042  . aspirin EC tablet 81 mg  81 mg Oral Daily Drema Dallas, MD   81 mg at 10/16/13 1006  . B-complex with vitamin C tablet 1 tablet  1 tablet Oral Daily Drema Dallas, MD   1 tablet at 10/16/13 1006  . calcium-vitamin D (OSCAL WITH D)  500-200 MG-UNIT per tablet 1 tablet  1 tablet Oral BID Allie Bossier, MD   1 tablet at 10/16/13 1007  . lamoTRIgine (LAMICTAL) tablet 100 mg  100 mg Oral QHS Allie Bossier, MD   100 mg at 10/15/13 2305  . [START ON 10/17/2013] levothyroxine (SYNTHROID, LEVOTHROID) tablet 112 mcg  112 mcg Oral QAC breakfast Allie Bossier, MD      . LORazepam (ATIVAN) injection 1-4 mg  1-4 mg Intravenous Q6H PRN Allie Bossier, MD      . Macitentan TABS 10 mg  10 mg Oral Daily Allie Bossier, MD   10 mg at 10/16/13 1006  . multivitamin with minerals tablet 1 tablet  1 tablet Oral q morning - 10a Allie Bossier, MD   1 tablet at 10/16/13  1006  . OLANZapine (ZYPREXA) tablet 7.5 mg  7.5 mg Oral QHS Allie Bossier, MD   7.5 mg at 10/15/13 2306  . ondansetron (ZOFRAN) tablet 4 mg  4 mg Oral Q6H PRN Allie Bossier, MD       Or  . ondansetron [facility]) injection 4 mg  4 mg Intravenous Q6H PRN Allie Bossier, MD      . Riociguat TABS 2.5 mg  2.5 mg Oral TID Allie Bossier, MD   2.5 mg at 10/16/13 1007  . spironolactone (ALDACTONE) tablet 50 mg  50 mg Oral Daily Allie Bossier, MD   50 mg at 10/16/13 1006  . vitamin C (ASCORBIC ACID) tablet 500 mg  500 mg Oral Daily Allie Bossier, MD   500 mg at 10/16/13 1007  . warfarin (COUMADIN) tablet 7.5 mg  7.5 mg Oral ONCE-1800 Christine E Shade, RPH      . Warfarin - Pharmacist Dosing Inpatient   Does not apply q1800 Dara Hoyer, [facility]        Psychiatric Specialty Exam:     Blood pressure 110/67, pulse 70, temperature 98.3 F (36.8 C), temperature source Oral, resp. rate 23, height $RemoveBe'5\' 2"'ofQRkTbDO$  (1.575 m), weight 149 lb 0.5 oz (67.6 kg), SpO2 99.00%.Body mass index is 27.25 kg/(m^2).  General Appearance: Bizarre, Disheveled and Confused and disorganized  Eye Contact::  Poor  Speech:  Rambling  Volume:  Increased  Mood:  Angry and Irritable  Affect:  Labile  Thought Process:  Circumstantial and Disorganized  Orientation:  Other:  Patient knows that she is in the hospital  Thought Content:  Paranoid Ideation, Rumination and Disorganized thinking patient mentioned that she will be better off dead   Suicidal Thoughts:  Yes.  with intent/plan  Homicidal Thoughts:  No  Memory:  Immediate;   Poor Recent;   Poor Remote;   Poor  Judgement:  Impaired  Insight:  Lacking  Psychomotor Activity:  Increased  Concentration:  Poor  Recall:  Poor  Fund of Knowledge:Poor  Language: Fair  Akathisia:  No  Handed:  Right  AIMS (if indicated):     Assets:  Social Support  Sleep:      Musculoskeletal: Strength & Muscle Tone: within normal limits Gait & Station: Patient lying on the bed Patient  leans: N/A  Treatment Plan Summary: Medication management, continue sitter for patient safety.  Increase collateral from her daughter and her psychiatrist.  Patient will require inpatient psychiatric treatment for stabilization and treatment.  Continue Zyprexa at present dose, patient may be noncompliant with medication and if patient does not see improvement consider increasing the dose later.  Use Ativan to help  the agitation.  Please call [phone] if you have any further questions.  Wilburn Keir T. 10/16/2013 2:31 PM

## 2013-10-16 NOTE — Progress Notes (Deleted)
CARE MANAGEMENT NOTE 10/16/2013  Patient:  Michaela Horton, Michaela Horton   Account Number:  [id number]  Date Initiated:  10/16/2013  Documentation initiated by:  DAVIS,RHONDA  Subjective/Objective Assessment:   pt with intentional overdose of meds.  hx of psych problems,has two duaghters, lives alone with supervision by the daughters.     Action/Plan:   tbd, psych consult?  Daughters may need family support resources for addictive and behavioral problem parents.   Anticipated DC Date:  10/19/2013   Anticipated DC Plan:  HOME/SELF CARE  In-house referral  Clinical Social Worker      DC Planning Services  NA      [facility] Choice  NA   Choice offered to / List presented to:  NA   DME arranged  NA      DME agency  NA     [address] arranged  NA      [place] agency  NA   Status of service:  In process, will continue to follow Medicare Important Message given?  NA - LOS <3 / Initial given by admissions (If response is "NO", the following Medicare IM given date fields will be blank) Date Medicare IM given:   Date Additional Medicare IM given:    Discharge Disposition:    Per UR Regulation:  Reviewed for med. necessity/level of care/duration of stay  If discussed at [place] of Stay Meetings, dates discussed:    Comments:  [id number]/Rhonda Eldridge Dace, [place], [place] [phone] Chart Reviewed for discharge and hospital needs. Discharge needs at time of review:  None present will follow for needs. Review of patient progress due on [id number]

## 2013-10-16 NOTE — Progress Notes (Signed)
Pt refused to take 1600 medications; she became very agitated/agressive. Would not get back in bed, refused treatment or listening to any staff. Pt became more agitated and aggrevated; b/p 185/105; hr 125; at 1715 pt began to escalate even further, she was assisted back to bed and MD ordered to give IM Geodon. At 1830, pt appears more calm, up in chair; able to communicate appropriately. Answers all questions. Vitals 108/72; HR 93; will continue to monitor closely

## 2013-10-16 NOTE — Progress Notes (Addendum)
[facility] PROGRESS NOTE  Michaela Horton YHC:[id number] DOB: [birth year] DOA: 10/15/2013 PCP: Gerrit Heck, MD  Assessment/Plan:  Drug overdose  -UDS shows no drug present to include benzodiazepines which patient is prescribed.  -Lamictal level pending  -Ethanol level negative   -Serum osmolality= 227mOsm/kg  -urine osmolality pending   Schizoaffective disorder  -Patient has received 30 mg Geodon IM, on 2/15 at time of admission. Today 2/16 patient cooperative pleasant, non-belligerent withstaff. -Consult it psychiatry for help with short-term/long-term medication;  -have spoken with Pharmacist Thy considering converting patient to a long-term IM antipsychotic.  1. recommended Risperdal 25 mg IM Q. 2wk  2. recommended continuing Geodon 30 mg IM for first 1-2 wks until Risperdal          takes effect -Before changing to this medication regimen will await psychiatric recommendation -Continue Ativan 1-4 mg IV q.6 hr PRN agitation   Bipolar  -Geodon 30 mg IM/day emergency department physician or psychiatrist will need to be paged to administer  -Await psychiatry recommendations    Altered mental status  -Most likely secondary to 1, 2, 3 however given patient's age and risk factors will need to rule out CVA. Obtain MRI/MRA; see results below  -Obtain blood culture  -Obtain urine culture  -Obtain TSH/free T4   Chronic diastolic CHF  -Daily a.m. Weights  -Strict I&O  -Troponin x3 negative  -ProBNP= 599  -Echocardiogram pending  Chronic thromboembolic pulmonary hypertension  -Patient on Adempas(vasodilator;NO sensitizer) and Opsumit (endothelin receptor antagonist).  -Spoke with Dr. Frederik Pear (pulmonologist) with concern that patient may go into fulminant heart failure with out these medications, he assured me that with PO, NO sensitizers he did not have to worry about fulminant heart failure when withdrawn abruptly, as with patient on pump.  -Patient currently  subtherapeutic on Coumadin. Admission INR= 1.62 , 2/16 INR= 2.0 -Coumadin per pharmacy  -Will restart patient's Adempas(vasodilator;NO sensitizer) and Opsumit (endothelin receptor antagonist). Now that patient has agreed to take her medication as prescribed  Chronic respiratory failure  -Continue patient on home regimen of 4 L via [place]   Hypothyroidism  - patient's home Synthroid dose is 161mcg daily PO patient not cooperative and agrees to take medication.      Code Status: Full code  Family Communication: Spoke with Ms. Erin Sons [phone] (daughter) concerning plan of care. Patient also has another daughter Asencion Gowda [phone]  Disposition Plan: Resolution altered metal status    Consultants:     Procedures: MRI brain/MRA 10/15/2013 Chronic changes as described. No acute intracranial abnormality.  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   PCXR 10/15/2013 Low lung volumes which accentuate the pulmonary findings. There is  not appear to be evidence of acute cardiopulmonary disease. There  are interstitial changes likely changes.    Antibiotics:    HPI/Subjective: Michaela Horton is a 73 y.o. yo BF PMHx schizoaffective disorder, bipolar disorder, chronic thromboembolic pulmonary hypertension, chronic respiratory failure, hypothyroidism S/P [place] filter placement, Hx SDH, PE. Presented to emergency department altered mental status. The patient's daughter reports the patient lives alone and has been "messing around with her home medication" and non-compliant with her Home oxygen regimen, 4L [place]. The patient's daughter reports she states "I'd be better off dead" yesterday. The patient's daughter reports she took medication in the morning but did not take her bedtime medication. Per [employer] the patient was found without pants on and feces on her hands. UDS negative (should be positive for benzos closed). Pt was given Geodon 30  mg  IM + Ativan 3 mg in the ED I/O to calm Pt. Upon arrival in ED patient was in 4. restraints and still requiring 4 people to safely restrained patient secondary to patient biting, spitting, kicking, scratching. Patient was unwilling or unable to answer questions directly concerning medication types last dosing. 2/16 totally different patient this a.m. Pleasant, cooperative, A./O. x4. Agrees to take all of her required medication.    Objective: Filed Vitals:   10/16/13 0100 10/16/13 0200 10/16/13 0300 10/16/13 0400  BP: $Re'90/58 90/55 99/56 'idl$ 96/49  Pulse: 72 73 74 70  Temp:    97.9 F (36.6 C)  TempSrc:    Oral  Resp: $Remo'23 23 23 23  'auJJq$ Height:      Weight:      SpO2: 100% 94% 98% 96%    Intake/Output Summary (Last 24 hours) at 10/16/13 0714 Last data filed at 10/16/13 0400  Gross per 24 hour  Intake    675 ml  Output    500 ml  Net    175 ml   Filed Weights   10/15/13 2000  Weight: 67.6 kg (149 lb 0.5 oz)    Exam:   General:  A./O. x4, NAD  Cardiovascular: Regular rhythm and rate, negative murmurs rubs gallops, DP/PT pulse 2+ bilateral  Respiratory: Clear to auscultation bilateral  Abdomen: Soft, nontender, nondistended, plus bowel sound  Musculoskeletal: Negative pedal edema   Data Reviewed: Basic Metabolic Panel:  Recent Labs Lab 10/15/13 1205 10/16/13 0555  NA 140 142  K 4.1 4.7  CL 105 109  CO2 23 26  GLUCOSE 95 91  BUN 21 14  CREATININE 1.23* 1.16*  CALCIUM 9.3 9.5  MG  --  1.9   Liver Function Tests:  Recent Labs Lab 10/15/13 1205 10/16/13 0555  AST 26 27  ALT 20 18  ALKPHOS 46 43  BILITOT 0.5 0.4  PROT 7.9 7.0  ALBUMIN 3.7 3.1*   No results found for this basename: LIPASE, AMYLASE,  in the last 168 hours No results found for this basename: AMMONIA,  in the last 168 hours CBC:  Recent Labs Lab 10/15/13 1205 10/16/13 0555  WBC 3.4* 3.3*  NEUTROABS  --  1.8  HGB 14.6 13.5  HCT 43.2 41.0  MCV 93.7 95.6  PLT 192 214   Cardiac  Enzymes:  Recent Labs Lab 10/15/13 1205 10/15/13 2018 10/15/13 2355  TROPONINI <0.30 <0.30 <0.30   BNP (last 3 results)  Recent Labs  10/15/13 2018  PROBNP 599.3*   CBG: No results found for this basename: GLUCAP,  in the last 168 hours  Recent Results (from the past 240 hour(s))  MRSA PCR SCREENING     Status: None   Collection Time    10/15/13  6:56 PM      Result Value Ref Range Status   MRSA by PCR NEGATIVE  NEGATIVE Final   Comment:            The GeneXpert MRSA Assay (FDA     approved for NASAL specimens     only), is one component of a     comprehensive MRSA colonization     surveillance program. It is not     intended to diagnose MRSA     infection nor to guide or     monitor treatment for     MRSA infections.     Studies: Dg Chest Port 1 View  10/15/2013   CLINICAL DATA:  ams  EXAM: PORTABLE CHEST -  1 VIEW  COMPARISON:  DG BONE SURVEY MET dated 07/19/2013; DG CHEST 2V dated 03/30/2013  FINDINGS: Low lung volumes. Cardiac silhouette is enlarged. It was tortuous and ectatic. There is prominence of the interstitial markings. Is not appear to be significant peribronchial cuffing. There are areas concerning for mild bronchiectasis within the right middle lobe and lingula regions. The osseous structures demonstrate no acute abnormalities.  IMPRESSION: Low lung volumes which accentuate the pulmonary findings. There is not appear to be evidence of acute cardiopulmonary disease. There are interstitial changes likely changes.   Electronically Signed   By: Margaree Mackintosh M.D.   On: 10/15/2013 12:20    Scheduled Meds: . aspirin EC  81 mg Oral Daily  . B-complex with vitamin C  1 tablet Oral Daily  . calcium-vitamin D  1 tablet Oral BID  . lamoTRIgine  100 mg Oral QHS  . levothyroxine  112 mcg Oral QAC breakfast  . Macitentan  10 mg Oral Daily  . multivitamin with minerals  1 tablet Oral q morning - 10a  . OLANZapine  7.5 mg Oral QHS  . Riociguat  2.5 mg Oral TID  .  spironolactone  50 mg Oral Daily  . vitamin C  500 mg Oral Daily  . Warfarin - Pharmacist Dosing Inpatient   Does not apply q1800   Continuous Infusions: . sodium chloride 75 mL (10/15/13 2042)    Active Problems:   HYPOTHYROIDISM   SCHIZOAFFECTIVE DISORDER   Chronic thromboembolic pulmonary hypertension   Chronic respiratory failure   Bipolar disorder   Altered mental state   Drug overdose    Time spent:31min    Aram Domzalski, J  [facility] Pager [phone]. If 7PM-7AM, please contact night-coverage at [email or web address], password [facility] 10/16/2013, 7:14 AM  LOS: 1 day

## 2013-10-16 NOTE — Progress Notes (Signed)
CARE MANAGEMENT NOTE 10/16/2013  Patient:  Michaela Horton, Michaela Horton   Account Number:  [id number]  Date Initiated:  10/16/2013  Documentation initiated by:  DAVIS,RHONDA  Subjective/Objective Assessment:   pt with intentional overdose of meds.  hx of psych problems,has two duaghters, lives alone with supervision by the daughters.     Action/Plan:   tbd, psych consult?  Daughters may need family support resources for addictive and behavioral problem parents.   Anticipated DC Date:  10/19/2013   Anticipated DC Plan:  HOME/SELF CARE  In-house referral  Clinical Social Worker      DC Planning Services  NA      [facility] Choice  NA   Choice offered to / List presented to:  NA   DME arranged  NA      DME agency  NA     [place] arranged  NA      [place] agency  NA   Status of service:  In process, will continue to follow Medicare Important Message given?  NA - LOS <3 / Initial given by admissions (If response is "NO", the following Medicare IM given date fields will be blank) Date Medicare IM given:   Date Additional Medicare IM given:    Discharge Disposition:    Per UR Regulation:  Reviewed for med. necessity/level of care/duration of stay  If discussed at [place] of Stay Meetings, dates discussed:    Comments:  [id number]/Rhonda Eldridge Dace, [place], [place] [phone] Chart Reviewed for discharge and hospital needs. Discharge needs at time of review:  None present will follow for needs. Review of patient progress due on [id number] [place]: In the [place] [email or web address] [facility] [employer] guide.

## 2013-10-16 NOTE — Telephone Encounter (Signed)
[email or web address] on number given in message. [phone], no voicemail. [place] Bing, CMA

## 2013-10-16 NOTE — Progress Notes (Signed)
Pt slept thru the night, will wake up to her name and gentle touch, took her medications ok, No PRN medication needed, no restraints needed.

## 2013-10-16 NOTE — Progress Notes (Signed)
Clinical Social Work  CSW met with patient at bedside. Patient alert but has disorganized speech. Patient struggles with participating in assessment and is a poor historian. For example, when CSW asked a question such as how many children she had, patient spoke about going to college, moving, being married and upset with previous husband. Despite being redirected multiple times, patient unable to remain focused. Patient does report that she was at home and "minding my own business" but dtr called 911. Patient reports she was not happy about this and wants to DC home. Patient is unable to answer questions re: MH needs.  CSW called and left a message with dtr in order to gather collateral information. Full assessment to follow.  [place], [place] [phone]

## 2013-10-16 NOTE — Progress Notes (Signed)
ANTICOAGULATION CONSULT NOTE - Follow up [place] for Warfarin Indication: h/o PE  No Known Allergies  Patient Measurements: Height: 5\' 2"  (157.5 cm) Weight: 149 lb 0.5 oz (67.6 kg) IBW/kg (Calculated) : 50.1  Vital Signs: Temp: 97.9 F (36.6 C) (02/16 0400) Temp src: Oral (02/16 0400) BP: 96/49 mmHg (02/16 0400) Pulse Rate: 70 (02/16 0400)  Labs:  Recent Labs  10/15/13 1205 10/15/13 2018 10/15/13 2355 10/16/13 0555  HGB 14.6  --   --  13.5  HCT 43.2  --   --  41.0  PLT 192  --   --  214  LABPROT 18.8*  --   --  22.1*  INR 1.62*  --   --  2.00*  CREATININE 1.23*  --   --  1.16*  TROPONINI <0.30 <0.30 <0.30 <0.30    Estimated Creatinine Clearance: 40.1 ml/min (by C-G formula based on Cr of 1.16).   Assessment: 8 yoF with schizoeffective disorder and bipolar, pulmonary hypertension on warfarin PTA for h/o PE 2011 presents agitated and violent.  Pharmacy consulted to manage warfarin.  Warfarin home dose = 7.5 mg daily except 5 mg on Tues/Thursday, last dose reported 2/14  INR 2, increased to therapeutic range  CBC:  Hgb 13.5, Plt 214.    Goal of Therapy:  INR 2-3 Monitor platelets by anticoagulation protocol: Yes   Plan:   Warfarin 7.5 mg po once tonight at 1800  Daily PT/INR.  Gretta Arab PharmD, BCPS Pager [phone] 10/16/2013 7:53 AM

## 2013-10-16 NOTE — Progress Notes (Signed)
Clinical Social Work Department CLINICAL SOCIAL WORK PSYCHIATRY SERVICE LINE ASSESSMENT 10/16/2013  Patient:  Michaela Horton  Account:  [id number]  Admit Date:  10/15/2013  Clinical Social Worker:  Sindy Messing, LCSW  Date/Time:  10/16/2013 02:15 PM Referred by:  Physician  Date referred:  10/16/2013 Reason for Referral  Psychosocial assessment   Presenting Symptoms/Problems (In the person's/family's own words):   Psych consulted due to medication management for schizoaffective D/O   Abuse/Neglect/Trauma History (check all that apply)  Denies history   Abuse/Neglect/Trauma Comments:   None reported   Psychiatric History (check all that apply)  Outpatient treatment  Inpatient/hospitilization   Psychiatric medications:  Lamictal 100 mg  Zyprexa 7.5 mg  Ativan 1-4 mg   Current Mental Health Hospitalizations/Previous Mental Health History:   Patient reports MH history in the past but is unable to provide any further details. Patient's dtr report that patient has been diagnosed with schizoaffective D/O in the past and that she receives medication management.   Current provider:   Salome Arnt   Place and Date:   [facility]   Current Medications:   Scheduled Meds:      . aspirin EC  81 mg Oral Daily  . B-complex with vitamin C  1 tablet Oral Daily  . calcium-vitamin D  1 tablet Oral BID  . lamoTRIgine  100 mg Oral QHS  . [START ON 10/17/2013] levothyroxine  112 mcg Oral QAC breakfast  . Macitentan  10 mg Oral Daily  . multivitamin with minerals  1 tablet Oral q morning - 10a  . OLANZapine  7.5 mg Oral QHS  . Riociguat  2.5 mg Oral TID  . spironolactone  50 mg Oral Daily  . vitamin C  500 mg Oral Daily  . warfarin  7.5 mg Oral ONCE-1800  . Warfarin - Pharmacist Dosing Inpatient   Does not apply q1800        Continuous Infusions:      . sodium chloride 75 mL (10/15/13 2042)          PRN Meds:.LORazepam, ondansetron (ZOFRAN) IV, ondansetron       Previous Impatient  Admission/Date/Reason:   Patient has been hospitalized but dtr reports it has been several years ago.   Emotional Health / Current Symptoms    Suicide/Self Harm  None reported   Suicide attempt in the past:   Patient denies any SI or HI.   Other harmful behavior:   None reported   Psychotic/Dissociative Symptoms  Disorganized Speech   Other Psychotic/Dissociative Symptoms:   Patient struggles with participating in assessment and is unable to provide background information.    Attention/Behavioral Symptoms  Inattentive   Other Attention / Behavioral Symptoms:   Patient is unable to fully participate in assessment due to disorganized speech.    Cognitive Impairment  Within Normal Limits   Other Cognitive Impairment:   Patient alert and oriented.    Mood and Adjustment  Aggressive/frustrated    Stress, Anxiety, Trauma, Any Recent Loss/Stressor  Other - See comment   Anxiety (frequency):   Dtr reports panic attacks in the past and reports that she had to come to the ED about 1 month ago.   Phobia (specify):   N/A   Compulsive behavior (specify):   N/A   Obsessive behavior (specify):   N/A   Other:   Substance Abuse/Use  None   SBIRT completed (please refer for detailed history):  N  Self-reported substance use:   Patient denies any substance use. Dtr  confirms no substance use as well.   Urinary Drug Screen Completed:  Y Alcohol level:   <11    Environmental/Housing/Living Arrangement  Stable housing   Who is in the home:   Alone   Emergency contact:  [name]   Financial  Medicare   Patient's Strengths and Goals (patient's own words):   Patient has supportive dtr that assists and provides care each day.   Clinical Social Worker's Interpretive Summary:   CSW received referral in order to complete psychosocial assessment. CSW reviewed chart and met with patient at bedside. CSW introduced myself and explained role.    Patient sitting in  chair but is easily distracted. Patient is unable to answer any questions about MH history and is fixated that she is upset with dtr that she called 911. Patient talks with CSW but changes topics randomly and is unable to be redirected.    CSW spoke with dtr via phone. Dtr reports that patient lives alone and dtr is her aide. Dtr reports that she was at home and thinks that patient was not taking her medications as prescribed. Dtr reports that patient was diagnosed with schizoaffective DO and was receiving medication management via [facility]. Patient had been on Lithium for over 20 years and medication was working well but caused liver damage so she had to change medications. Dtr reports that the transition has been difficult and patient's emotions have not been stable.    Dtr reports that patient is usually compliant and calm but when dtr arrived at the home, patient was angry and threatening dtr. Dtr reports this is abnormal behavior for patient and she called 911. Dtr reports that patient's medications have been changed several times partly because patient is going to start getting treatment at [facility]. Dtr is hopeful that medications can get adjusted and patient can return home.    Patient's dtr puts all of patient's medications in pill boxes but patient is left to take medication on her own. Dtr reports that patient gets confused sometimes and often tries to wean herself off of medication. Dtr states that patient did not like taking Lorazepam twice a day because it made her sleepy so MD changed to taking Buspar during the day. Dtr suspects that patient stopped taking Lorazepam altogether and is unsure if symptoms could be related.    CSW reviewed chart which stated that psych MD recommends inpt when medically stable.   Disposition:  Recommend Psych CSW continuing to support while in hospital  [place], [place] [phone]

## 2013-10-17 DIAGNOSIS — I369 Nonrheumatic tricuspid valve disorder, unspecified: Secondary | ICD-10-CM

## 2013-10-17 LAB — CBC WITH DIFFERENTIAL/PLATELET
Basophils Absolute: 0 10*3/uL (ref 0.0–0.1)
Basophils Relative: 1 % (ref 0–1)
EOS ABS: 0.1 10*3/uL (ref 0.0–0.7)
Eosinophils Relative: 3 % (ref 0–5)
HCT: 37.5 % (ref 36.0–46.0)
HEMOGLOBIN: 12.5 g/dL (ref 12.0–15.0)
LYMPHS ABS: 1.5 10*3/uL (ref 0.7–4.0)
LYMPHS PCT: 40 % (ref 12–46)
MCH: 31.8 pg (ref 26.0–34.0)
MCHC: 33.3 g/dL (ref 30.0–36.0)
MCV: 95.4 fL (ref 78.0–100.0)
Monocytes Absolute: 0.3 10*3/uL (ref 0.1–1.0)
Monocytes Relative: 9 % (ref 3–12)
NEUTROS ABS: 1.8 10*3/uL (ref 1.7–7.7)
NEUTROS PCT: 48 % (ref 43–77)
PLATELETS: 210 10*3/uL (ref 150–400)
RBC: 3.93 MIL/uL (ref 3.87–5.11)
RDW: 14.7 % (ref 11.5–15.5)
WBC: 3.8 10*3/uL — AB (ref 4.0–10.5)

## 2013-10-17 LAB — COMPREHENSIVE METABOLIC PANEL
ALT: 20 U/L (ref 0–35)
AST: 27 U/L (ref 0–37)
Albumin: 3 g/dL — ABNORMAL LOW (ref 3.5–5.2)
Alkaline Phosphatase: 42 U/L (ref 39–117)
BUN: 18 mg/dL (ref 6–23)
CO2: 25 mEq/L (ref 19–32)
CREATININE: 1.21 mg/dL — AB (ref 0.50–1.10)
Calcium: 8.8 mg/dL (ref 8.4–10.5)
Chloride: 107 mEq/L (ref 96–112)
GFR calc non Af Amer: 44 mL/min — ABNORMAL LOW (ref >90)
GFR, EST AFRICAN AMERICAN: 51 mL/min — AB (ref >90)
Glucose, Bld: 86 mg/dL (ref 70–99)
Potassium: 4.1 mEq/L (ref 3.7–5.3)
SODIUM: 138 meq/L (ref 137–147)
TOTAL PROTEIN: 6.6 g/dL (ref 6.0–8.3)
Total Bilirubin: 0.3 mg/dL (ref 0.3–1.2)

## 2013-10-17 LAB — PROTIME-INR
INR: 2.57 — AB (ref 0.00–1.49)
PROTHROMBIN TIME: 26.7 s — AB (ref 11.6–15.2)

## 2013-10-17 LAB — LAMOTRIGINE LEVEL: Lamotrigine Lvl: 5.8 ug/mL (ref 3.0–14.0)

## 2013-10-17 LAB — MAGNESIUM: Magnesium: 1.8 mg/dL (ref 1.5–2.5)

## 2013-10-17 MED ORDER — ZIPRASIDONE MESYLATE 20 MG IM SOLR
20.0000 mg | Freq: Every day | INTRAMUSCULAR | Status: DC
  Administered 2013-10-17 – 2013-10-18 (×2): 20 mg via INTRAMUSCULAR
  Filled 2013-10-17 (×3): qty 20

## 2013-10-17 MED ORDER — TADALAFIL (PAH) 20 MG PO TABS
20.0000 mg | ORAL_TABLET | Freq: Every day | ORAL | Status: DC
  Administered 2013-10-17 – 2013-10-18 (×2): 20 mg via ORAL
  Filled 2013-10-17 (×3): qty 1

## 2013-10-17 MED ORDER — BOSENTAN 62.5 MG PO TABS
62.5000 mg | ORAL_TABLET | Freq: Two times a day (BID) | ORAL | Status: DC
  Filled 2013-10-17 (×6): qty 1

## 2013-10-17 MED ORDER — WARFARIN SODIUM 2.5 MG PO TABS
2.5000 mg | ORAL_TABLET | Freq: Once | ORAL | Status: AC
  Administered 2013-10-17: 2.5 mg via ORAL
  Filled 2013-10-17: qty 1

## 2013-10-17 NOTE — Progress Notes (Signed)
Comments:  [id number]/VVZSMO Rosana Hoes RN, BSN, [place], [phone] Chart reviewed for update of needs and condition. patient is refusing to take im meds but did cooperate after much coaxing and antivan iv/patient did become very aggressive to the staff pm of [id number]/psych is aware of behaviors.  Patient is okayed to be transferred to medical telemetry floor.

## 2013-10-17 NOTE — Progress Notes (Addendum)
ANTICOAGULATION CONSULT NOTE - Follow up [place] for Warfarin Indication: h/o PE  No Known Allergies  Patient Measurements: Height: 5\' 2"  (157.5 cm) Weight: 149 lb 0.5 oz (67.6 kg) IBW/kg (Calculated) : 50.1  Vital Signs: Temp: 98.1 F (36.7 C) (02/17 0800) Temp src: Oral (02/17 0800) BP: 88/51 mmHg (02/17 0419) Pulse Rate: 69 (02/17 0200)  Labs:  Recent Labs  10/15/13 1205 10/15/13 2018 10/15/13 2355 10/16/13 0555 10/17/13 0318  HGB 14.6  --   --  13.5 12.5  HCT 43.2  --   --  41.0 37.5  PLT 192  --   --  214 210  LABPROT 18.8*  --   --  22.1* 26.7*  INR 1.62*  --   --  2.00* 2.57*  CREATININE 1.23*  --   --  1.16* 1.21*  TROPONINI <0.30 <0.30 <0.30 <0.30  --     Estimated Creatinine Clearance: 38.4 ml/min (by C-G formula based on Cr of 1.21).   Assessment: 67 yoF admitted 2/15 with schizoeffective disorder and bipolar, pulmonary hypertension with CTEPH (inoperable) on warfarin PTA for h/o PE 2011.  She presents with AMS, agitated and violent, possible medication non-compliance.  Pharmacy consulted to manage warfarin.  Warfarin home dose = 7.5 mg daily except 5 mg on Tues/Thursday, last dose reported 2/14  INR 2.57, large increase, but remains w/in therapeutic range  Warfarin dose 2/16 was NOT given.  Pt refused.  Due to large INR increase after only one dose, suspect noncompliance at home.  Pt may need lower dose at discharge if warfarin therapy to continue.  CBC:  Hgb 12.5, Plt 210.    Goal of Therapy:  INR 2-3 Monitor platelets by anticoagulation protocol: Yes   Plan:   Warfarin 2.5 mg po once tonight at 1800  Daily PT/INR.  Gretta Arab PharmD, BCPS Pager [phone] 10/17/2013 10:33 AM

## 2013-10-17 NOTE — Progress Notes (Addendum)
[facility] PROGRESS NOTE  Gearlene Godsil ZDG:[id number] DOB: Aug 13, 1941 DOA: 10/15/2013 PCP: [name], MD  Assessment/Plan:  Drug overdose  -UDS shows no drug present to include benzodiazepines which patient is prescribed.  -Lamictal level pending  -Ethanol level negative   -Serum osmolality= 281mOsm/kg  -urine osmolality pending   Schizoaffective disorder  -Patient has received 30 mg Geodon IM, on 2/15 at time of admission. Today 2/16 patient cooperative pleasant, non-belligerent withstaff. -Consult it psychiatry for help with short-term/long-term medication;  -have spoken with Pharmacist Thy considering converting patient to a long-term IM antipsychotic.  1. recommended Risperdal 25 mg IM Q. 2wk  2. recommended continuing Geodon 30 mg IM for first 1-2 wks until Risperdal          takes effect -Patient uncontrollable when on PO Zyprexa alone. Will add Geodon 20 mg IM daily. Will request psychiatry evaluate patient for involuntary commitment.   -Continue Ativan 1-4 mg IV q.6 hr PRN agitation     Bipolar  -Geodon 30 mg IM/day emergency department physician or psychiatrist will need to be paged to administer  -Secondary to patient's violent behavior toward staff, we'll continue Geodon IM at 30 mg daily -Reconsult to psychiatry to determine if patient can't be involuntarily committed. Do not believe patient is safe to return home.  ADDENDUM; paperwork for involuntary commitment (IVC) has been completed. Spoke with daughter, patient, and son who is a Development worker, community in [place].   Altered mental status  -Most likely secondary to 1, 2, 3 however given patient's age and risk factors will need to rule out CVA. -MRI/MRA; see results below  -blood culture NGTD -urine culture pending -TSH WNL -free T4   Chronic diastolic CHF  -Daily a.m. Weights  -Strict I&O  -Troponin x3 negative  -ProBNP= 599  -Echocardiogram pending  Chronic thromboembolic pulmonary  hypertension  -Patient on Adempas(vasodilator;NO sensitizer) and Opsumit (endothelin receptor antagonist).  -Spoke with Dr. Enzo Montgomery (pulmonologist) with concern that patient may go into fulminant heart failure with out these medications, he assured me that with PO, NO sensitizers he did not have to worry about fulminant heart failure when withdrawn abruptly, as with patient on pump.  -Patient currently subtherapeutic on Coumadin. Admission INR= 1.62 , 2/16 INR= 2.0 -Coumadin per pharmacy  -Will restart patient's Adempas(vasodilator;NO sensitizer) and Opsumit (endothelin receptor antagonist). Now that patient has agreed to take her medication as prescribed  Chronic respiratory failure  -Continue patient on home regimen of 4 L via [place]   Hypothyroidism  - patient's home Synthroid dose is daily PO patient not cooperative and agrees to take medication.      Code Status: Full code  Family Communication: Spoke with Ms. Britt Boozer [phone] (daughter) concerning plan of care. Patient also has another daughter Lucy Antigua [phone]. Spoke with son who is a physician in [place] and he concurs with plan for IVC  Disposition Plan: Resolution altered metal status    Consultants: Dr. Kathryne Sharper (Psychiatry) Dr. Cyril Mourning (pulmonology)    Procedures: Echocardiogram 10/17/2013 - Left ventricle: mild  concentric hypertrophy.  -LVEF= 60% -65%.  - Ventricular septum: The contour showed leftward deviation and diastolic flattening. These changes are consistent with RV volume and pressure overload. - Left atrium: The atrium was mildly dilated. - Right ventricle: The cavity size was moderately dilated. function was moderately reduced. - Right atrium: The atrium was severely dilated. - Tricuspid valve: Moderate regurgitation. - Pulmonary arteries: PA peak pressure: Hg (S).   MRI brain/MRA 10/15/2013 Chronic changes as  described. No acute intracranial abnormality.   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   PCXR 10/15/2013 Low lung volumes which accentuate the pulmonary findings. There is  not appear to be evidence of acute cardiopulmonary disease. There  are interstitial changes likely changes.    Antibiotics:    HPI/Subjective: Crystal Scarberry is a 74 y.o. yo BF PMHx schizoaffective disorder, bipolar disorder, chronic thromboembolic pulmonary hypertension, chronic respiratory failure, hypothyroidism S/P [place] filter placement, Hx SDH, PE. Presented to emergency department altered mental status. The patient's daughter reports the patient lives alone and has been "messing around with her home medication" and non-compliant with her Home oxygen regimen, 4L [place]. The patient's daughter reports she states "I'd be better off dead" yesterday. The patient's daughter reports she took medication in the morning but did not take her bedtime medication. Per [employer] the patient was found without pants on and feces on her hands. UDS negative (should be positive for benzos closed). Pt was given Geodon 30 mg IM + Ativan 3 mg in the ED I/O to calm Pt. Upon arrival in ED patient was in 4. restraints and still requiring 4 people to safely restrained patient secondary to patient biting, spitting, kicking, scratching. Patient was unwilling or unable to answer questions directly concerning medication types last dosing. 2/16 totally different patient this a.m. Pleasant, cooperative, A./O. x4. Agrees to take all of her required medication. 2/17 at approximately 1830 was paged by RN secondary to patient becoming extremely agitated, belligerent, violent again patient striking and biting at medical staff. Geodon 20 mg IM + Ativan 3 mg were  administered and after approximately 30 minutes patient became more cooperative, began to cooperate with staff, and could answer questions appropriately. Currently patient cooperative A./O. x4.  Objective: Filed  Vitals:   10/16/13 2324 10/17/13 0020 10/17/13 0200 10/17/13 0419  BP: 131/83 134/78  88/51  Pulse: 76  69   Temp:    98.4 F (36.9 C)  TempSrc:    Oral  Resp: 20 37 24 25  Height:      Weight:      SpO2: 96% 95% 93% 96%    Intake/Output Summary (Last 24 hours) at 10/17/13 0737 Last data filed at 10/17/13 0600  Gross per 24 hour  Intake   1755 ml  Output    800 ml  Net    955 ml   Filed Weights   10/15/13 2000  Weight: 67.6 kg (149 lb 0.5 oz)    Exam:   General:  A./O. x4, NAD  Cardiovascular: Regular rhythm and rate, negative murmurs rubs gallops, DP/PT pulse 2+ bilateral  Respiratory: Clear to auscultation bilateral  Abdomen: Soft, nontender, nondistended, plus bowel sound  Musculoskeletal: Negative pedal edema   Data Reviewed: Basic Metabolic Panel:  Recent Labs Lab 10/15/13 1205 10/16/13 0555 10/17/13 0318  NA 140 142 138  K 4.1 4.7 4.1  CL 105 109 107  CO2 $Re'23 26 25  'thP$ GLUCOSE 95 91 86  BUN $Re'21 14 18  'BbA$ CREATININE 1.23* 1.16* 1.21*  CALCIUM 9.3 9.5 8.8  MG  --  1.9 1.8   Liver Function Tests:  Recent Labs Lab 10/15/13 1205 10/16/13 0555 10/17/13 0318  AST $Re'26 27 27  'bry$ ALT $R'20 18 20  'dq$ ALKPHOS 46 43 42  BILITOT 0.5 0.4 0.3  PROT 7.9 7.0 6.6  ALBUMIN 3.7 3.1* 3.0*   No results found for this basename: LIPASE, AMYLASE,  in the last 168 hours No results found for this basename: AMMONIA,  in the last 168 hours CBC:  Recent Labs Lab 10/15/13 1205 10/16/13 0555 10/17/13 0318  WBC 3.4* 3.3* 3.8*  NEUTROABS  --  1.8 1.8  HGB 14.6 13.5 12.5  HCT 43.2 41.0 37.5  MCV 93.7 95.6 95.4  PLT 192 214 210   Cardiac Enzymes:  Recent Labs Lab 10/15/13 1205 10/15/13 2018 10/15/13 2355 10/16/13 0555  TROPONINI <0.30 <0.30 <0.30 <0.30   BNP (last 3 results)  Recent Labs  10/15/13 2018  PROBNP 599.3*   CBG: No results found for this basename: GLUCAP,  in the last 168 hours  Recent Results (from the past 240 hour(s))  MRSA PCR SCREENING      Status: None   Collection Time    10/15/13  6:56 PM      Result Value Ref Range Status   MRSA by PCR NEGATIVE  NEGATIVE Final   Comment:            The GeneXpert MRSA Assay (FDA     approved for NASAL specimens     only), is one component of a     comprehensive MRSA colonization     surveillance program. It is not     intended to diagnose MRSA     infection nor to guide or     monitor treatment for     MRSA infections.  CULTURE, BLOOD (ROUTINE X 2)     Status: None   Collection Time    10/15/13  8:10 PM      Result Value Ref Range Status   Specimen Description BLOOD LEFT HAND   Final   Special Requests BOTTLES DRAWN AEROBIC ONLY 4 CC   Final   Culture  Setup Time     Final   Value: 10/16/2013 01:43     Performed at [facility]   Culture     Final   Value:        BLOOD CULTURE RECEIVED NO GROWTH TO DATE CULTURE WILL BE HELD FOR 5 DAYS BEFORE ISSUING A FINAL NEGATIVE REPORT     Performed at [facility]   Report Status PENDING   Incomplete  CULTURE, BLOOD (ROUTINE X 2)     Status: None   Collection Time    10/15/13  8:18 PM      Result Value Ref Range Status   Specimen Description BLOOD RIGHT HAND   Final   Special Requests BOTTLES DRAWN AEROBIC ONLY 3 CC   Final   Culture  Setup Time     Final   Value: 10/16/2013 01:43     Performed at [facility]   Culture     Final   Value:        BLOOD CULTURE RECEIVED NO GROWTH TO DATE CULTURE WILL BE HELD FOR 5 DAYS BEFORE ISSUING A FINAL NEGATIVE REPORT     Performed at [facility]   Report Status PENDING   Incomplete     Studies: Mr Virgel Paling Wo Contrast  10/16/2013   CLINICAL DATA:  Recent onset of confusion. Patient is anticoagulated. Stroke risk factors include hypertension.  EXAM: MRI HEAD WITHOUT CONTRAST  MRA HEAD WITHOUT CONTRAST  TECHNIQUE: Multiplanar, multiecho pulse sequences of the brain and surrounding structures were obtained without intravenous contrast. Angiographic images of the  head were obtained using MRA technique without contrast.  COMPARISON:  DG CERVICAL SPINE 2-3 VIEWS dated 08/02/2013; CT HEAD W/O CM dated 06/07/2013; CT HEAD W/O CM dated 05/01/2013; CT HEAD W/O  CM dated 04/29/2013  FINDINGS: MRI HEAD FINDINGS  No evidence for acute infarction, hemorrhage, mass lesion, hydrocephalus, or extra-axial fluid. Moderate cerebral and cerebellar atrophy. Chronic microvascular ischemic change affects the periventricular and subcortical white matter. No large vessel infarct. No significant lacunar infarct. Flow voids are maintained throughout the carotid, basilar, and vertebral arteries. There are no areas of chronic hemorrhage. Pituitary and cerebellar tonsils unremarkable. Mild cervical spondylosis. No osseous lesions. Visualized calvarium, skull base, and upper cervical osseous structures unremarkable. Scalp and extracranial soft tissues, orbits, sinuses, and mastoids show no acute process.  MRA HEAD FINDINGS  The internal carotid arteries are widely patent. The basilar artery is widely patent with vertebrals codominant. The anterior, middle, and posterior cerebral arteries are widely patent. There is no cerebellar branch occlusion. There is moderate dolichoectasia consistent with chronic hypertension. Shallow outpouching from the inferior cavernous segment right ICA represents minor atheromatous aneurysmal dilatation. There is no intracranial stenosis or berry aneurysm.  IMPRESSION: Chronic changes as described.  No acute intracranial abnormality.  Dolichoectasia intracranial vascular structures without proximal stenosis.   Electronically Signed   By: Davonna Belling M.D.   On: 10/16/2013 09:21   Mr Brain Wo Contrast  10/16/2013   CLINICAL DATA:  Recent onset of confusion. Patient is anticoagulated. Stroke risk factors include hypertension.  EXAM: MRI HEAD WITHOUT CONTRAST  MRA HEAD WITHOUT CONTRAST  TECHNIQUE: Multiplanar, multiecho pulse sequences of the brain and surrounding structures were  obtained without intravenous contrast. Angiographic images of the head were obtained using MRA technique without contrast.  COMPARISON:  DG CERVICAL SPINE 2-3 VIEWS dated 08/02/2013; CT HEAD W/O CM dated 06/07/2013; CT HEAD W/O CM dated 05/01/2013; CT HEAD W/O CM dated 04/29/2013  FINDINGS: MRI HEAD FINDINGS  No evidence for acute infarction, hemorrhage, mass lesion, hydrocephalus, or extra-axial fluid. Moderate cerebral and cerebellar atrophy. Chronic microvascular ischemic change affects the periventricular and subcortical white matter. No large vessel infarct. No significant lacunar infarct. Flow voids are maintained throughout the carotid, basilar, and vertebral arteries. There are no areas of chronic hemorrhage. Pituitary and cerebellar tonsils unremarkable. Mild cervical spondylosis. No osseous lesions. Visualized calvarium, skull base, and upper cervical osseous structures unremarkable. Scalp and extracranial soft tissues, orbits, sinuses, and mastoids show no acute process.  MRA HEAD FINDINGS  The internal carotid arteries are widely patent. The basilar artery is widely patent with vertebrals codominant. The anterior, middle, and posterior cerebral arteries are widely patent. There is no cerebellar branch occlusion. There is moderate dolichoectasia consistent with chronic hypertension. Shallow outpouching from the inferior cavernous segment right ICA represents minor atheromatous aneurysmal dilatation. There is no intracranial stenosis or berry aneurysm.  IMPRESSION: Chronic changes as described.  No acute intracranial abnormality.  Dolichoectasia intracranial vascular structures without proximal stenosis.   Electronically Signed   By: Davonna Belling M.D.   On: 10/16/2013 09:21   Dg Chest Port 1 View  10/15/2013   CLINICAL DATA:  ams  EXAM: PORTABLE CHEST - 1 VIEW  COMPARISON:  DG BONE SURVEY MET dated 07/19/2013; DG CHEST 2V dated 03/30/2013  FINDINGS: Low lung volumes. Cardiac silhouette is enlarged. It was  tortuous and ectatic. There is prominence of the interstitial markings. Is not appear to be significant peribronchial cuffing. There are areas concerning for mild bronchiectasis within the right middle lobe and lingula regions. The osseous structures demonstrate no acute abnormalities.  IMPRESSION: Low lung volumes which accentuate the pulmonary findings. There is not appear to be evidence of acute cardiopulmonary disease. There are  interstitial changes likely changes.   Electronically Signed   By: Margaree Mackintosh M.D.   On: 10/15/2013 12:20    Scheduled Meds: . aspirin EC  81 mg Oral Daily  . B-complex with vitamin C  1 tablet Oral Daily  . calcium-vitamin D  1 tablet Oral BID  . lamoTRIgine  100 mg Oral QHS  . levothyroxine  112 mcg Oral QAC breakfast  . Macitentan  10 mg Oral Daily  . multivitamin with minerals  1 tablet Oral q morning - 10a  . OLANZapine  7.5 mg Oral QHS  . Riociguat  2.5 mg Oral TID  . spironolactone  50 mg Oral Daily  . vitamin C  500 mg Oral Daily  . warfarin  7.5 mg Oral ONCE-1800  . Warfarin - Pharmacist Dosing Inpatient   Does not apply q1800   Continuous Infusions: . sodium chloride 75 mL (10/17/13 0245)    Active Problems:   HYPOTHYROIDISM   SCHIZOAFFECTIVE DISORDER   Chronic thromboembolic pulmonary hypertension   Chronic respiratory failure   Bipolar disorder   Altered mental state   Drug overdose    Time spent: 90 min    WOODS, CURTIS, J  [facility] Pager [phone]. If 7PM-7AM, please contact night-coverage at [email or web address], password [facility] 10/17/2013, 7:37 AM  LOS: 2 days

## 2013-10-17 NOTE — Consult Note (Signed)
Psychiatry Follow up Consult   Assessment: AXIS I:  Schizoaffective Disorder AXIS II:  Deferred AXIS III:   Past Medical History  Diagnosis Date  . Dyspnea   . Pulmonary embolism   . Breast cyst     right  . Hypothyroidism   . Renal cyst   . Leukocytopenia   . Monoclonal gammopathy   . Osteoarthritis   . Schizoaffective disorder   . Wears dentures   . Pulmonary hypertension   . Hypertension     pulmonary   AXIS IV:  problems related to social environment and problems with primary support group AXIS V:  11-20 some danger of hurting self or others possible OR occasionally fails to maintain minimal personal hygiene OR gross impairment in communication  Plan:  Recommend psychiatric Inpatient admission when medically cleared.  Subjective:   Michaela Horton is a 73 y.o. female patient admitted with change in mental status and possible drug overdose .  Patient remains very labile, agitated and requires frequent intramuscular medication for agitation.  She remains very paranoid, combative grandiose and delusional.  She admitted taking psychotropic medication in the past however she stopped taking it she does not needed.  Currently she is taking Zyprexa with little help.  In the past she had tried Risperdal, lithium and latuda.  Patient has been violent and does require inpatient treatment and if refused can be involuntarily committed.   Past Psychiatric History: Past Medical History  Diagnosis Date  . Dyspnea   . Pulmonary embolism   . Breast cyst     right  . Hypothyroidism   . Renal cyst   . Leukocytopenia   . Monoclonal gammopathy   . Osteoarthritis   . Schizoaffective disorder   . Wears dentures   . Pulmonary hypertension   . Hypertension     pulmonary    reports that she has never smoked. She has never used smokeless tobacco. She reports that she does not drink alcohol or use illicit drugs. Family History  Problem Relation Age of Onset  . Cancer Father     PROSTATE      Living Arrangements: Alone   Abuse/Neglect [facility]) Physical Abuse: Denies Verbal Abuse: Denies Sexual Abuse: Denies Allergies:  No Known Allergies  ACT Assessment Complete:  No:   Past Psychiatric History: Diagnosis:  Schizoaffective disorder   Hospitalizations:  Yes but don't know   Outpatient Care:  Dr Emelda Brothers  Substance Abuse Care:  Unknown   Self-Mutilation:  Unknown   Suicidal Attempts:  Unknown   Homicidal Behaviors:  Unknown    Violent Behaviors:  Yes    Place of Residence:  Lives alone Marital Status:  Unknown Employed/Unemployed:  Unknown Education:  Unknown Family Supports:  Daughter Objective: Blood pressure 88/51, pulse 69, temperature 97.9 F (36.6 C), temperature source Oral, resp. rate 25, height $RemoveBe'5\' 2"'RJyyOTDJT$  (1.575 m), weight 149 lb 0.5 oz (67.6 kg), SpO2 96.00%.Body mass index is 27.25 kg/(m^2). Results for orders placed during the hospital encounter of 10/15/13 (from the past 72 hour(s))  CBC     Status: Abnormal   Collection Time    10/15/13 12:05 PM      Result Value Ref Range   WBC 3.4 (*) 4.0 - 10.5 K/uL   RBC 4.61  3.87 - 5.11 MIL/uL   Hemoglobin 14.6  12.0 - 15.0 g/dL   HCT 43.2  36.0 - 46.0 %   MCV 93.7  78.0 - 100.0 fL   MCH 31.7  26.0 - 34.0 pg  MCHC 33.8  30.0 - 36.0 g/dL   RDW 14.8  11.5 - 15.5 %   Platelets 192  150 - 400 K/uL  COMPREHENSIVE METABOLIC PANEL     Status: Abnormal   Collection Time    10/15/13 12:05 PM      Result Value Ref Range   Sodium 140  137 - 147 mEq/L   Potassium 4.1  3.7 - 5.3 mEq/L   Chloride 105  96 - 112 mEq/L   CO2 23  19 - 32 mEq/L   Glucose, Bld 95  70 - 99 mg/dL   BUN 21  6 - 23 mg/dL   Creatinine, Ser 1.23 (*) 0.50 - 1.10 mg/dL   Calcium 9.3  8.4 - 10.5 mg/dL   Total Protein 7.9  6.0 - 8.3 g/dL   Albumin 3.7  3.5 - 5.2 g/dL   AST 26  0 - 37 U/L   Comment: SLIGHT HEMOLYSIS   ALT 20  0 - 35 U/L   Alkaline Phosphatase 46  39 - 117 U/L   Total Bilirubin 0.5  0.3 - 1.2 mg/dL   GFR calc non Af Amer 43 (*)  >90 mL/min   GFR calc Af Amer 50 (*) >90 mL/min   Comment: (NOTE)     The eGFR has been calculated using the CKD EPI equation.     This calculation has not been validated in all clinical situations.     eGFR's persistently <90 mL/min signify possible Chronic Kidney     Disease.  ETHANOL     Status: None   Collection Time    10/15/13 12:05 PM      Result Value Ref Range   Alcohol, Ethyl (B) <11  0 - 11 mg/dL   Comment:            LOWEST DETECTABLE LIMIT FOR     SERUM ALCOHOL IS 11 mg/dL     FOR MEDICAL PURPOSES ONLY  SALICYLATE LEVEL     Status: Abnormal   Collection Time    10/15/13 12:05 PM      Result Value Ref Range   Salicylate Lvl <4.9 (*) 2.8 - 20.0 mg/dL  TROPONIN I     Status: None   Collection Time    10/15/13 12:05 PM      Result Value Ref Range   Troponin I <0.30  <0.30 ng/mL   Comment:            Due to the release kinetics of cTnI,     a negative result within the first hours     of the onset of symptoms does not rule out     myocardial infarction with certainty.     If myocardial infarction is still suspected,     repeat the test at appropriate intervals.  PROTIME-INR     Status: Abnormal   Collection Time    10/15/13 12:05 PM      Result Value Ref Range   Prothrombin Time 18.8 (*) 11.6 - 15.2 seconds   INR 1.62 (*) 0.00 - 1.49  BLOOD GAS, ARTERIAL     Status: Abnormal   Collection Time    10/15/13 12:14 PM      Result Value Ref Range   O2 Content 2.0     Delivery systems NASAL CANNULA     pH, Arterial 7.420  7.350 - 7.450   pCO2 arterial 35.0  35.0 - 45.0 mmHg   pO2, Arterial 55.9 (*) 80.0 - 100.0 mmHg  Bicarbonate 22.3  20.0 - 24.0 mEq/L   TCO2 19.3  0 - 100 mmol/L   Acid-base deficit 1.1  0.0 - 2.0 mmol/L   O2 Saturation 88.6     Patient temperature 98.6     Collection site LEFT RADIAL     Drawn by [id number]     Sample type ARTERIAL DRAW     Allens test (pass/fail) PASS  PASS  ACETAMINOPHEN LEVEL     Status: None   Collection Time    10/15/13  12:38 PM      Result Value Ref Range   Acetaminophen (Tylenol), Serum <15.0  10 - 30 ug/mL   Comment:            THERAPEUTIC CONCENTRATIONS VARY     SIGNIFICANTLY. A RANGE OF 10-30     ug/mL MAY BE AN EFFECTIVE     CONCENTRATION FOR MANY PATIENTS.     HOWEVER, SOME ARE BEST TREATED     AT CONCENTRATIONS OUTSIDE THIS     RANGE.     ACETAMINOPHEN CONCENTRATIONS     >150 ug/mL AT 4 HOURS AFTER     INGESTION AND >50 ug/mL AT 12     HOURS AFTER INGESTION ARE     OFTEN ASSOCIATED WITH TOXIC     REACTIONS.  URINALYSIS, ROUTINE W REFLEX MICROSCOPIC     Status: Abnormal   Collection Time    10/15/13  4:54 PM      Result Value Ref Range   Color, Urine YELLOW  YELLOW   APPearance CLEAR  CLEAR   Specific Gravity, Urine 1.015  1.005 - 1.030   pH 7.5  5.0 - 8.0   Glucose, UA NEGATIVE  NEGATIVE mg/dL   Hgb urine dipstick NEGATIVE  NEGATIVE   Bilirubin Urine NEGATIVE  NEGATIVE   Ketones, ur 15 (*) NEGATIVE mg/dL   Protein, ur NEGATIVE  NEGATIVE mg/dL   Urobilinogen, UA 0.2  0.0 - 1.0 mg/dL   Nitrite NEGATIVE  NEGATIVE   Leukocytes, UA NEGATIVE  NEGATIVE   Comment: MICROSCOPIC NOT DONE ON URINES WITH NEGATIVE PROTEIN, BLOOD, LEUKOCYTES, NITRITE, OR GLUCOSE <1000 mg/dL.  URINE RAPID DRUG SCREEN (HOSP PERFORMED)     Status: None   Collection Time    10/15/13  4:54 PM      Result Value Ref Range   Opiates NONE DETECTED  NONE DETECTED   Cocaine NONE DETECTED  NONE DETECTED   Benzodiazepines NONE DETECTED  NONE DETECTED   Amphetamines NONE DETECTED  NONE DETECTED   Tetrahydrocannabinol NONE DETECTED  NONE DETECTED   Barbiturates NONE DETECTED  NONE DETECTED   Comment:            DRUG SCREEN FOR MEDICAL PURPOSES     ONLY.  IF CONFIRMATION IS NEEDED     FOR ANY PURPOSE, NOTIFY LAB     WITHIN 5 DAYS.                LOWEST DETECTABLE LIMITS     FOR URINE DRUG SCREEN     Drug Class       Cutoff (ng/mL)     Amphetamine      1000     Barbiturate      200     Benzodiazepine   500      Tricyclics       370     Opiates          300     Cocaine  300     THC              50  MRSA PCR SCREENING     Status: None   Collection Time    10/15/13  6:56 PM      Result Value Ref Range   MRSA by PCR NEGATIVE  NEGATIVE   Comment:            The GeneXpert MRSA Assay (FDA     approved for NASAL specimens     only), is one component of a     comprehensive MRSA colonization     surveillance program. It is not     intended to diagnose MRSA     infection nor to guide or     monitor treatment for     MRSA infections.  CULTURE, BLOOD (ROUTINE X 2)     Status: None   Collection Time    10/15/13  8:10 PM      Result Value Ref Range   Specimen Description BLOOD LEFT HAND     Special Requests BOTTLES DRAWN AEROBIC ONLY 4 CC     Culture  Setup Time       Value: 10/16/2013 01:43     Performed at [facility]   Culture       Value:        BLOOD CULTURE RECEIVED NO GROWTH TO DATE CULTURE WILL BE HELD FOR 5 DAYS BEFORE ISSUING A FINAL NEGATIVE REPORT     Performed at [facility]   Report Status PENDING    TSH     Status: None   Collection Time    10/15/13  8:18 PM      Result Value Ref Range   TSH 1.931  0.350 - 4.500 uIU/mL   Comment: Performed at [facility]  TROPONIN I     Status: None   Collection Time    10/15/13  8:18 PM      Result Value Ref Range   Troponin I <0.30  <0.30 ng/mL   Comment:            Due to the release kinetics of cTnI,     a negative result within the first hours     of the onset of symptoms does not rule out     myocardial infarction with certainty.     If myocardial infarction is still suspected,     repeat the test at appropriate intervals.  PRO B NATRIURETIC PEPTIDE     Status: Abnormal   Collection Time    10/15/13  8:18 PM      Result Value Ref Range   Pro B Natriuretic peptide (BNP) 599.3 (*) 0 - 125 pg/mL  LACTIC ACID, PLASMA     Status: None   Collection Time    10/15/13  8:18 PM      Result Value Ref  Range   Lactic Acid, Venous 1.9  0.5 - 2.2 mmol/L  CULTURE, BLOOD (ROUTINE X 2)     Status: None   Collection Time    10/15/13  8:18 PM      Result Value Ref Range   Specimen Description BLOOD RIGHT HAND     Special Requests BOTTLES DRAWN AEROBIC ONLY 3 CC     Culture  Setup Time       Value: 10/16/2013 01:43     Performed at [facility]   Culture       Value:  BLOOD CULTURE RECEIVED NO GROWTH TO DATE CULTURE WILL BE HELD FOR 5 DAYS BEFORE ISSUING A FINAL NEGATIVE REPORT     Performed at [facility]   Report Status PENDING    ETHANOL     Status: None   Collection Time    10/15/13  8:18 PM      Result Value Ref Range   Alcohol, Ethyl (B) <11  0 - 11 mg/dL   Comment:            LOWEST DETECTABLE LIMIT FOR     SERUM ALCOHOL IS 11 mg/dL     FOR MEDICAL PURPOSES ONLY  OSMOLALITY     Status: None   Collection Time    10/15/13  8:18 PM      Result Value Ref Range   Osmolality 296  275 - 300 mOsm/kg   Comment: Performed at [facility]  TROPONIN I     Status: None   Collection Time    10/15/13 11:55 PM      Result Value Ref Range   Troponin I <0.30  <0.30 ng/mL   Comment:            Due to the release kinetics of cTnI,     a negative result within the first hours     of the onset of symptoms does not rule out     myocardial infarction with certainty.     If myocardial infarction is still suspected,     repeat the test at appropriate intervals.  COMPREHENSIVE METABOLIC PANEL     Status: Abnormal   Collection Time    10/16/13  5:55 AM      Result Value Ref Range   Sodium 142  137 - 147 mEq/L   Potassium 4.7  3.7 - 5.3 mEq/L   Chloride 109  96 - 112 mEq/L   CO2 26  19 - 32 mEq/L   Glucose, Bld 91  70 - 99 mg/dL   BUN 14  6 - 23 mg/dL   Creatinine, Ser 1.16 (*) 0.50 - 1.10 mg/dL   Calcium 9.5  8.4 - 10.5 mg/dL   Total Protein 7.0  6.0 - 8.3 g/dL   Albumin 3.1 (*) 3.5 - 5.2 g/dL   AST 27  0 - 37 U/L   ALT 18  0 - 35 U/L   Alkaline  Phosphatase 43  39 - 117 U/L   Total Bilirubin 0.4  0.3 - 1.2 mg/dL   GFR calc non Af Amer 46 (*) >90 mL/min   GFR calc Af Amer 54 (*) >90 mL/min   Comment: (NOTE)     The eGFR has been calculated using the CKD EPI equation.     This calculation has not been validated in all clinical situations.     eGFR's persistently <90 mL/min signify possible Chronic Kidney     Disease.  MAGNESIUM     Status: None   Collection Time    10/16/13  5:55 AM      Result Value Ref Range   Magnesium 1.9  1.5 - 2.5 mg/dL  CBC WITH DIFFERENTIAL     Status: Abnormal   Collection Time    10/16/13  5:55 AM      Result Value Ref Range   WBC 3.3 (*) 4.0 - 10.5 K/uL   RBC 4.29  3.87 - 5.11 MIL/uL   Hemoglobin 13.5  12.0 - 15.0 g/dL   HCT 41.0  36.0 - 46.0 %  MCV 95.6  78.0 - 100.0 fL   MCH 31.5  26.0 - 34.0 pg   MCHC 32.9  30.0 - 36.0 g/dL   RDW 15.0  11.5 - 15.5 %   Platelets 214  150 - 400 K/uL   Neutrophils Relative % 55  43 - 77 %   Neutro Abs 1.8  1.7 - 7.7 K/uL   Lymphocytes Relative 30  12 - 46 %   Lymphs Abs 1.0  0.7 - 4.0 K/uL   Monocytes Relative 11  3 - 12 %   Monocytes Absolute 0.4  0.1 - 1.0 K/uL   Eosinophils Relative 3  0 - 5 %   Eosinophils Absolute 0.1  0.0 - 0.7 K/uL   Basophils Relative 1  0 - 1 %   Basophils Absolute 0.0  0.0 - 0.1 K/uL  TROPONIN I     Status: None   Collection Time    10/16/13  5:55 AM      Result Value Ref Range   Troponin I <0.30  <0.30 ng/mL   Comment:            Due to the release kinetics of cTnI,     a negative result within the first hours     of the onset of symptoms does not rule out     myocardial infarction with certainty.     If myocardial infarction is still suspected,     repeat the test at appropriate intervals.  PROTIME-INR     Status: Abnormal   Collection Time    10/16/13  5:55 AM      Result Value Ref Range   Prothrombin Time 22.1 (*) 11.6 - 15.2 seconds   INR 2.00 (*) 0.00 - 1.49  HEMOGLOBIN A1C     Status: Abnormal   Collection  Time    10/16/13  5:55 AM      Result Value Ref Range   Hemoglobin A1C 6.0 (*) <5.7 %   Comment: (NOTE)                                                                               According to the ADA Clinical Practice Recommendations for 2011, when     HbA1c is used as a screening test:      >=6.5%   Diagnostic of Diabetes Mellitus               (if abnormal result is confirmed)     5.7-6.4%   Increased risk of developing Diabetes Mellitus     References:Diagnosis and Classification of Diabetes Mellitus,Diabetes     IOXB,3532,99(MEQAS 1):S62-S69 and Standards of Medical Care in             Diabetes - 2011,Diabetes Care,2011,34 (Suppl 1):S11-S61.   Mean Plasma Glucose 126 (*) <117 mg/dL   Comment: Performed at [facility]     Status: None   Collection Time    10/16/13 11:36 AM      Result Value Ref Range   Specimen Description URINE, CLEAN CATCH     Special Requests NONE     Culture  Setup Time       Value: 10/16/2013  14:36     Performed at [facility] Count       Value: 40,000 COLONIES/ML     Performed at [facility]   Culture       Value: GRAM NEGATIVE RODS     Performed at [facility]   Report Status PENDING    OSMOLALITY, URINE     Status: None   Collection Time    10/16/13 11:36 AM      Result Value Ref Range   Osmolality, Ur 613  390 - 1090 mOsm/kg   Comment: Performed at [facility]  COMPREHENSIVE METABOLIC PANEL     Status: Abnormal   Collection Time    10/17/13  3:18 AM      Result Value Ref Range   Sodium 138  137 - 147 mEq/L   Potassium 4.1  3.7 - 5.3 mEq/L   Chloride 107  96 - 112 mEq/L   CO2 25  19 - 32 mEq/L   Glucose, Bld 86  70 - 99 mg/dL   BUN 18  6 - 23 mg/dL   Creatinine, Ser 7.77 (*) 0.50 - 1.10 mg/dL   Calcium 8.8  8.4 - 15.4 mg/dL   Total Protein 6.6  6.0 - 8.3 g/dL   Albumin 3.0 (*) 3.5 - 5.2 g/dL   AST 27  0 - 37 U/L   ALT 20  0 - 35 U/L   Alkaline Phosphatase 42  39 -  117 U/L   Total Bilirubin 0.3  0.3 - 1.2 mg/dL   GFR calc non Af Amer 44 (*) >90 mL/min   GFR calc Af Amer 51 (*) >90 mL/min   Comment: (NOTE)     The eGFR has been calculated using the CKD EPI equation.     This calculation has not been validated in all clinical situations.     eGFR's persistently <90 mL/min signify possible Chronic Kidney     Disease.  MAGNESIUM     Status: None   Collection Time    10/17/13  3:18 AM      Result Value Ref Range   Magnesium 1.8  1.5 - 2.5 mg/dL  CBC WITH DIFFERENTIAL     Status: Abnormal   Collection Time    10/17/13  3:18 AM      Result Value Ref Range   WBC 3.8 (*) 4.0 - 10.5 K/uL   RBC 3.93  3.87 - 5.11 MIL/uL   Hemoglobin 12.5  12.0 - 15.0 g/dL   HCT 29.8  59.0 - 34.1 %   MCV 95.4  78.0 - 100.0 fL   MCH 31.8  26.0 - 34.0 pg   MCHC 33.3  30.0 - 36.0 g/dL   RDW 88.8  54.1 - 82.5 %   Platelets 210  150 - 400 K/uL   Neutrophils Relative % 48  43 - 77 %   Neutro Abs 1.8  1.7 - 7.7 K/uL   Lymphocytes Relative 40  12 - 46 %   Lymphs Abs 1.5  0.7 - 4.0 K/uL   Monocytes Relative 9  3 - 12 %   Monocytes Absolute 0.3  0.1 - 1.0 K/uL   Eosinophils Relative 3  0 - 5 %   Eosinophils Absolute 0.1  0.0 - 0.7 K/uL   Basophils Relative 1  0 - 1 %   Basophils Absolute 0.0  0.0 - 0.1 K/uL  PROTIME-INR     Status: Abnormal   Collection Time  10/17/13  3:18 AM      Result Value Ref Range   Prothrombin Time 26.7 (*) 11.6 - 15.2 seconds   INR 2.57 (*) 0.00 - 1.49   Labs are reviewed.  Current Facility-Administered Medications  Medication Dose Route Frequency Provider Last Rate Last Dose  . 0.9 %  sodium chloride infusion   Intravenous Continuous Allie Bossier, MD 125 mL/hr at 10/17/13 1348 1,000 mL at 10/17/13 1348  . aspirin EC tablet 81 mg  81 mg Oral Daily Allie Bossier, MD   81 mg at 10/16/13 1006  . B-complex with vitamin C tablet 1 tablet  1 tablet Oral Daily Allie Bossier, MD   1 tablet at 10/16/13 1006  . bosentan (TRACLEER) tablet 62.5  mg  62.5 mg Oral BID Rigoberto Noel, MD      . calcium-vitamin D (OSCAL WITH D) 500-200 MG-UNIT per tablet 1 tablet  1 tablet Oral BID Allie Bossier, MD   1 tablet at 10/16/13 2211  . lamoTRIgine (LAMICTAL) tablet 100 mg  100 mg Oral QHS Allie Bossier, MD   100 mg at 10/16/13 2212  . levothyroxine (SYNTHROID, LEVOTHROID) tablet 112 mcg  112 mcg Oral QAC breakfast Allie Bossier, MD   112 mcg at 10/17/13 9147  . LORazepam (ATIVAN) injection 1-4 mg  1-4 mg Intravenous Q6H PRN Allie Bossier, MD   2 mg at 10/17/13 1358  . multivitamin with minerals tablet 1 tablet  1 tablet Oral q morning - 10a Allie Bossier, MD   1 tablet at 10/16/13 1006  . OLANZapine (ZYPREXA) tablet 7.5 mg  7.5 mg Oral QHS Allie Bossier, MD   7.5 mg at 10/16/13 2216  . ondansetron (ZOFRAN) tablet 4 mg  4 mg Oral Q6H PRN Allie Bossier, MD       Or  . ondansetron [facility]) injection 4 mg  4 mg Intravenous Q6H PRN Allie Bossier, MD      . spironolactone (ALDACTONE) tablet 50 mg  50 mg Oral Daily Allie Bossier, MD   50 mg at 10/16/13 1006  . Tadalafil (PAH) TABS 20 mg  20 mg Oral Daily Rigoberto Noel, MD      . vitamin C (ASCORBIC ACID) tablet 500 mg  500 mg Oral Daily Allie Bossier, MD   500 mg at 10/16/13 1007  . warfarin (COUMADIN) tablet 2.5 mg  2.5 mg Oral ONCE-1800 Christine E Shade, RPH      . Warfarin - Pharmacist Dosing Inpatient   Does not apply [place], [facility]      . ziprasidone (GEODON) injection 20 mg  20 mg Intramuscular Daily Allie Bossier, MD   20 mg at 10/17/13 1155    Psychiatric Specialty Exam:     Blood pressure 88/51, pulse 69, temperature 97.9 F (36.6 C), temperature source Oral, resp. rate 25, height $RemoveBe'5\' 2"'[id number]$  (1.575 m), weight 149 lb 0.5 oz (67.6 kg), SpO2 96.00%.Body mass index is 27.25 kg/(m^2).  General Appearance: Bizarre, Disheveled and Confused and disorganized  Eye Contact::  Poor  Speech:  Rambling  Volume:  Increased  Mood:  Angry and Irritable  Affect:  Labile  Thought  Process:  Circumstantial and Disorganized  Orientation:  Other:  Patient knows that she is in the hospital  Thought Content:  Paranoid Ideation, Rumination and Disorganized thinking patient mentioned that she will be better off dead   Suicidal Thoughts:  Yes.  with intent/plan  Homicidal Thoughts:  No  Memory:  Immediate;   Poor Recent;   Poor Remote;   Poor  Judgement:  Impaired  Insight:  Lacking  Psychomotor Activity:  Increased  Concentration:  Poor  Recall:  Poor  Fund of Knowledge:Poor  Language: Fair  Akathisia:  No  Handed:  Right  AIMS (if indicated):     Assets:  Social Support  Sleep:      Musculoskeletal: Strength & Muscle Tone: within normal limits Gait & Station: Patient lying on the bed Patient leans: N/A  Treatment Plan Summary: Medication management, continue sitter for patient safety.  Discontinue Zyprexa and try Risperdal  M tab 1 mg twice a day.  Monitor for extrapyramidal side effects and EPS. Patient require inpatient psychiatric treatment for stabilization and treatment.  Patient will require involuntary commitment if she refused voluntary.  Social worker please Hospital doctor for [facility] unit for bed availability once patient is medically cleared. Please call [phone] if you have any further questions.  Gervis Gaba T. 10/17/2013 2:32 PM

## 2013-10-17 NOTE — Progress Notes (Signed)
CSW met with pt dtr as requested by rn. Per chart review, patient recommended for inpatient psych treatment, patient is poor historian and unable to fully engage in assessments. Per chart review patient recommended for involuntary commitment if patient refused treatment. Pt daughter also refusing inpatient psychiatric placement.   CSW met with pt to provide supportive counseling. Patient duaghter shared that her intention of calling for help was to diffuse the situation stay for 1-2 days and then have patient return home. Pt daughter shared that she stays with her mother throughout the day, until she has another client for a few hours, and then returns if needed. Patient daughter states that her brother pays pt daughter to aid for patient mother during the day. Patient daughter states that patient was followed by Dr. Plovosky however patient was taken off of Lithium and this was difficult so they chose to switch to a [facility]. Patient dtr shares that she has a f/u appointment on Friday. Pt dtr shares that she wishes to have patient return home and feels like she can handle patient at home. CSW and pt discussed that the decision would come from the MD however CSW would discuss with MD.   CSW spoke with RN and MD. Patient continues to refuse medications and requires PRN medication. Patient RN states that it took several nurses to assist with medication administration due to agitation. Per MD patient needs psychiatric treatment to stabilize patient and does not feel comfortable discharging patient home. MD completed IVC papers. PT md to follow up wit pt dtr in room. CSW also encouraged MD to speak with pt son.   CSW met with pt daughter to discuss MD recommending inpatient geri psychiatric treatment for patient and the involuntary commitment. Patient daughter was upset and stated she did not wish for patient to be at an inpatient hospital. CSW and pt daughter discussed the IVC process.  Patient daughter requested information if she disagreed with the MD. CSW gave patient daughter information for patient experience. CSW will also follow up with MD and director regarding ethics consult.   Patient daughter asked for MD to call patient son who is an ED physician. Pt dtr thanked csw for concern and support.   At this time, pt pending geriatric psych placement.    , LCSW [phone]  ED CSW 10/17/2013 1644pm  Covering csw.  

## 2013-10-17 NOTE — Progress Notes (Signed)
  Echocardiogram 2D Echocardiogram has been performed.  Basilia Jumbo 10/17/2013, 8:34 AM

## 2013-10-17 NOTE — ED Provider Notes (Signed)
Medical screening examination/treatment/procedure(s) were conducted as a shared visit with non-physician practitioner(s) and myself.  I personally evaluated the patient during the encounter.   Patient with ams and multiple comorbidities. Mild hypoxia. Likely aso psych component. Required repeat geodon. Admit to internal medicine  Jasper Riling. Alvino Chapel, MD 10/17/13 [phone]

## 2013-10-17 NOTE — Consult Note (Signed)
Name: Michaela Horton MRN: [id number] DOB: [birth year]    ADMISSION DATE:  10/15/2013 CONSULTATION DATE:  10/17/2013  REFERRING MD :  Sherral Hammers PRIMARY SERVICE:  [facility]  CHIEF COMPLAINT: advice regarding PAH meds   HISTORY OF PRESENT ILLNESS:  73 y/o BF, never smoker, with PMH of schizoaffective disorder, PE (10/11), CTEPH & PAH on 4L O2 admitted 2/15 with psychotic episode.  Concern for medication non-compliance.     KEY EVENTS / STUDIES: 06/24/10 - dx of PE and Dx of inoperable CTEPH & [facility] 12/2010 - Cardiac cath at [facility] showed RA pressure 5, PA pressure of 85 / 25, wedge pressure of 8, cardiac output was 4.2 L with an index of 2.3 and PVR of 9.640 and 08/19/10 - Venous dopplers neg  11/28/10 - Echocardiogram: Severely elevated RV sys pressure with dilated R Ht est at 99 with nl LV  11/05/10 - VQ Mod to high prob PE > referred to [facility] 12/19/2010. started on 02 24 h/day 2 lpm at rest , 5 lpm with activity  PFT's (minimal airflow obst with dlco =51%).  01/2011 - She was placed on tracclear with addition of tadalafil in November 2012. She was switched from tadalafil  To rociguat seventh 2014, macitentan replaced bosentan November 2014. 05/2013 - ECHO at [facility]: mild RV dysfunction 05/2013 - hx of SDH after a fall, maintained on coumadin  ............................................................................................................. 2/15 - admit to [facility] with psychotic episode   SUBJECTIVE: No acute events.    VITAL SIGNS: Temp:  [98.1 F (36.7 C)-98.4 F (36.9 C)] 98.1 F (36.7 C) (02/17 0800) Pulse Rate:  [69-80] 69 (02/17 0200) Resp:  [18-37] 25 (02/17 0419) BP: (84-180)/(51-110) 88/51 mmHg (02/17 0419) SpO2:  [93 %-97 %] 96 % (02/17 0419)  PHYSICAL EXAMINATION: Gen. Pleasant, well-nourished, in no distress, normal affect ENT:  no lesions, no post nasal drip Neck: No JVD, no thyromegaly, no carotid bruits Lungs: no use of accessory muscles, no dullness to percussion, clear  without rales or rhonchi  Cardiovascular: Rhythm regular, s1 nml, loud P2, no murmurs, no peripheral edema Abdomen: soft and non-tender, no hepatosplenomegaly, BS normal. Musculoskeletal: No deformities, no cyanosis or clubbing Neuro:  alert, non focal Skin:  Warm, no lesions/ rash    Recent Labs Lab 10/15/13 1205 10/16/13 0555 10/17/13 0318  NA 140 142 138  K 4.1 4.7 4.1  CL 105 109 107  CO2 $Re'23 26 25  'Gzd$ BUN $R'21 14 18  'ma$ CREATININE 1.23* 1.16* 1.21*  GLUCOSE 95 91 86    Recent Labs Lab 10/15/13 1205 10/16/13 0555 10/17/13 0318  HGB 14.6 13.5 12.5  HCT 43.2 41.0 37.5  WBC 3.4* 3.3* 3.8*  PLT 192 214 210   Mr [facility] Wo Contrast  10/16/2013   CLINICAL DATA:  Recent onset of confusion. Patient is anticoagulated. Stroke risk factors include hypertension.  EXAM: MRI HEAD WITHOUT CONTRAST  MRA HEAD WITHOUT CONTRAST  TECHNIQUE: Multiplanar, multiecho pulse sequences of the brain and surrounding structures were obtained without intravenous contrast. Angiographic images of the head were obtained using MRA technique without contrast.  COMPARISON:  DG CERVICAL SPINE 2-3 VIEWS dated 08/02/2013; CT HEAD W/O CM dated 06/07/2013; CT HEAD W/O CM dated 05/01/2013; CT HEAD W/O CM dated 04/29/2013  FINDINGS: MRI HEAD FINDINGS  No evidence for acute infarction, hemorrhage, mass lesion, hydrocephalus, or extra-axial fluid. Moderate cerebral and cerebellar atrophy. Chronic microvascular ischemic change affects the periventricular and subcortical white matter. No large vessel infarct. No significant lacunar infarct. Flow voids are maintained throughout  the carotid, basilar, and vertebral arteries. There are no areas of chronic hemorrhage. Pituitary and cerebellar tonsils unremarkable. Mild cervical spondylosis. No osseous lesions. Visualized calvarium, skull base, and upper cervical osseous structures unremarkable. Scalp and extracranial soft tissues, orbits, sinuses, and mastoids show no acute process.  MRA HEAD  FINDINGS  The internal carotid arteries are widely patent. The basilar artery is widely patent with vertebrals codominant. The anterior, middle, and posterior cerebral arteries are widely patent. There is no cerebellar branch occlusion. There is moderate dolichoectasia consistent with chronic hypertension. Shallow outpouching from the inferior cavernous segment right ICA represents minor atheromatous aneurysmal dilatation. There is no intracranial stenosis or berry aneurysm.  IMPRESSION: Chronic changes as described.  No acute intracranial abnormality.  Dolichoectasia intracranial vascular structures without proximal stenosis.   Electronically Signed   By: Rolla Flatten M.D.   On: 10/16/2013 09:21   Mr Brain Wo Contrast  10/16/2013   CLINICAL DATA:  Recent onset of confusion. Patient is anticoagulated. Stroke risk factors include hypertension.  EXAM: MRI HEAD WITHOUT CONTRAST  MRA HEAD WITHOUT CONTRAST  TECHNIQUE: Multiplanar, multiecho pulse sequences of the brain and surrounding structures were obtained without intravenous contrast. Angiographic images of the head were obtained using MRA technique without contrast.  COMPARISON:  DG CERVICAL SPINE 2-3 VIEWS dated 08/02/2013; CT HEAD W/O CM dated 06/07/2013; CT HEAD W/O CM dated 05/01/2013; CT HEAD W/O CM dated 04/29/2013  FINDINGS: MRI HEAD FINDINGS  No evidence for acute infarction, hemorrhage, mass lesion, hydrocephalus, or extra-axial fluid. Moderate cerebral and cerebellar atrophy. Chronic microvascular ischemic change affects the periventricular and subcortical white matter. No large vessel infarct. No significant lacunar infarct. Flow voids are maintained throughout the carotid, basilar, and vertebral arteries. There are no areas of chronic hemorrhage. Pituitary and cerebellar tonsils unremarkable. Mild cervical spondylosis. No osseous lesions. Visualized calvarium, skull base, and upper cervical osseous structures unremarkable. Scalp and extracranial soft  tissues, orbits, sinuses, and mastoids show no acute process.  MRA HEAD FINDINGS  The internal carotid arteries are widely patent. The basilar artery is widely patent with vertebrals codominant. The anterior, middle, and posterior cerebral arteries are widely patent. There is no cerebellar branch occlusion. There is moderate dolichoectasia consistent with chronic hypertension. Shallow outpouching from the inferior cavernous segment right ICA represents minor atheromatous aneurysmal dilatation. There is no intracranial stenosis or berry aneurysm.  IMPRESSION: Chronic changes as described.  No acute intracranial abnormality.  Dolichoectasia intracranial vascular structures without proximal stenosis.   Electronically Signed   By: Rolla Flatten M.D.   On: 10/16/2013 09:21   Dg Chest Port 1 View  10/15/2013   CLINICAL DATA:  ams  EXAM: PORTABLE CHEST - 1 VIEW  COMPARISON:  DG BONE SURVEY MET dated 07/19/2013; DG CHEST 2V dated 03/30/2013  FINDINGS: Low lung volumes. Cardiac silhouette is enlarged. It was tortuous and ectatic. There is prominence of the interstitial markings. Is not appear to be significant peribronchial cuffing. There are areas concerning for mild bronchiectasis within the right middle lobe and lingula regions. The osseous structures demonstrate no acute abnormalities.  IMPRESSION: Low lung volumes which accentuate the pulmonary findings. There is not appear to be evidence of acute cardiopulmonary disease. There are interstitial changes likely changes.   Electronically Signed   By: Margaree Mackintosh M.D.   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 -Oxygen as be continued -Lasix 40 mg  per day and Aldactone can be continued -We may have to reconsider use of Coumadin if she has more falls  PCCM to sign off   Noe Gens, NP-C [facility] Pgr: [phone] or [phone]   [facility] V.  [phone] 10/17/2013, 9:07 AM

## 2013-10-18 LAB — CBC WITH DIFFERENTIAL/PLATELET
BASOS ABS: 0 10*3/uL (ref 0.0–0.1)
Basophils Relative: 1 % (ref 0–1)
EOS ABS: 0.1 10*3/uL (ref 0.0–0.7)
EOS PCT: 4 % (ref 0–5)
HEMATOCRIT: 36.8 % (ref 36.0–46.0)
Hemoglobin: 12.4 g/dL (ref 12.0–15.0)
LYMPHS PCT: 35 % (ref 12–46)
Lymphs Abs: 1.3 10*3/uL (ref 0.7–4.0)
MCH: 32.2 pg (ref 26.0–34.0)
MCHC: 33.7 g/dL (ref 30.0–36.0)
MCV: 95.6 fL (ref 78.0–100.0)
MONO ABS: 0.3 10*3/uL (ref 0.1–1.0)
Monocytes Relative: 8 % (ref 3–12)
Neutro Abs: 2 10*3/uL (ref 1.7–7.7)
Neutrophils Relative %: 53 % (ref 43–77)
Platelets: 185 10*3/uL (ref 150–400)
RBC: 3.85 MIL/uL — ABNORMAL LOW (ref 3.87–5.11)
RDW: 14.8 % (ref 11.5–15.5)
WBC: 3.8 10*3/uL — AB (ref 4.0–10.5)

## 2013-10-18 LAB — MAGNESIUM: Magnesium: 1.7 mg/dL (ref 1.5–2.5)

## 2013-10-18 LAB — COMPREHENSIVE METABOLIC PANEL
ALBUMIN: 2.8 g/dL — AB (ref 3.5–5.2)
ALK PHOS: 40 U/L (ref 39–117)
ALT: 18 U/L (ref 0–35)
AST: 21 U/L (ref 0–37)
BUN: 16 mg/dL (ref 6–23)
CHLORIDE: 107 meq/L (ref 96–112)
CO2: 22 mEq/L (ref 19–32)
Calcium: 8.8 mg/dL (ref 8.4–10.5)
Creatinine, Ser: 1.05 mg/dL (ref 0.50–1.10)
GFR calc Af Amer: 60 mL/min — ABNORMAL LOW (ref >90)
GFR calc non Af Amer: 52 mL/min — ABNORMAL LOW (ref >90)
Glucose, Bld: 93 mg/dL (ref 70–99)
POTASSIUM: 4.5 meq/L (ref 3.7–5.3)
SODIUM: 136 meq/L — AB (ref 137–147)
Total Bilirubin: 0.3 mg/dL (ref 0.3–1.2)
Total Protein: 6.4 g/dL (ref 6.0–8.3)

## 2013-10-18 LAB — URINE CULTURE: Colony Count: 40000

## 2013-10-18 LAB — PROTIME-INR
INR: 2.04 — ABNORMAL HIGH (ref 0.00–1.49)
PROTHROMBIN TIME: 22.4 s — AB (ref 11.6–15.2)

## 2013-10-18 MED ORDER — WARFARIN SODIUM 2.5 MG PO TABS
2.5000 mg | ORAL_TABLET | Freq: Once | ORAL | Status: AC
  Administered 2013-10-18: 2.5 mg via ORAL
  Filled 2013-10-18: qty 1

## 2013-10-18 NOTE — Progress Notes (Signed)
ANTICOAGULATION CONSULT NOTE - Follow up [facility] for Warfarin Indication: h/o PE  No Known Allergies  Patient Measurements: Height: 5\' 2"  (157.5 cm) Weight: 160 lb 0.9 oz (72.6 kg) IBW/kg (Calculated) : 50.1  Vital Signs: Temp: 98.1 F (36.7 C) (02/18 0400) Temp src: Oral (02/18 0400) BP: 105/58 mmHg (02/18 0800) Pulse Rate: 71 (02/18 0800)  Labs:  Recent Labs  10/15/13 2018 10/15/13 2355 10/16/13 0555 10/17/13 0318 10/18/13 0340  HGB  --   --  13.5 12.5 12.4  HCT  --   --  41.0 37.5 36.8  PLT  --   --  214 210 185  LABPROT  --   --  22.1* 26.7* 22.4*  INR  --   --  2.00* 2.57* 2.04*  CREATININE  --   --  1.16* 1.21* 1.05  TROPONINI <0.30 <0.30 <0.30  --   --     Estimated Creatinine Clearance: 45.8 ml/min (by C-G formula based on Cr of 1.05).   Assessment: 45 yoF admitted 2/15 with schizoeffective disorder and bipolar, pulmonary hypertension with CTEPH (inoperable) on warfarin PTA for h/o PE 2011.  She presents with AMS, agitated and violent, possible medication non-compliance.  Pharmacy consulted to manage warfarin.  Warfarin home dose = 7.5 mg daily except 5 mg on Tues/Thursday, last dose reported 2/14  INR 2.04, decreased but remains w/in therapeutic range  Warfarin dose 2/16 was NOT given.  Pt refused.  Warfarin dose 2.5mg  on 2/17 was given.  Due to large INR increase after only one dose on 2/15, suspect noncompliance at home.  Pt may need lower dose at discharge if warfarin therapy to continue.  CBC:  Hgb 12.4, Plt 185.    Goal of Therapy:  INR 2-3 Monitor platelets by anticoagulation protocol: Yes   Plan:   Warfarin 2.5 mg po once tonight at 1800  Daily PT/INR.  Gretta Arab PharmD, BCPS Pager [phone] 10/18/2013 8:35 AM

## 2013-10-18 NOTE — Progress Notes (Signed)
[facility] PROGRESS NOTE  Michaela Horton QTM:[id number] DOB: 1941-02-27 DOA: 10/15/2013 PCP: Gerrit Heck, MD Interim summary: 73 y/o BF, never smoker, with PMH of schizoaffective disorder, PE (10/11), CTEPH & PAH on 4L O2 admitted 2/15 with psychotic episode. Presented to emergency department altered mental status. The patient's daughter reports the patient lives alone and has been "messing around with her home medication" and non-compliant with her Home oxygen regimen, 4L [place]. On the evening of 2/17 pt became agitated, and started being combative at the medical staff. GEODON was given.     Assessment/Plan: Drug overdose  -UDS is negative. -Ethanol level negative  - lamictal level normal.  Schizoaffective disorder  -Patient has received 30 mg Geodon IM, -Consulted  psychiatry for help with short-term/long-term medication;  -have spoken with Pharmacist Thy considering converting patient to a long-term IM antipsychotic.  1. recommended Risperdal 25 mg IM Q. 2wk  2. recommended continuing Geodon 30 mg IM for first 1-2 wks until Risperdal  takes effect  -she was involuntarily committed. [name] does not want the patient to go to inpatient psychiatry facility.  -Continue Ativan 1-4 mg IV q.6 hr PRN agitation  Bipolar  -Geodon 30 mg IM/day -Secondary to patient's violent behavior toward staff, we'll continue Geodon IM at 30 mg daily  -Do not believe patient is safe to return home.  ADDENDUM; paperwork for involuntary commitment (IVC) has been completed. Spoke with daughter, patient, and son who is a Engineer, drilling in [place].  Altered mental status  -Most likely secondary to 1, 2, 3 however given patient's age and risk factors will need to rule out CVA. -MRI/MRA; see results below  -blood culture NGTD  -urine culture shows proteus, 40,000 colonies.   -TSH WNL   Chronic diastolic CHF  -Daily a.m. Weights  -Strict I&O  -Troponin x3 negative  -ProBNP= 599   -Echocardiogram   Chronic thromboembolic pulmonary hypertension  -Patient on Adempas(vasodilator;NO sensitizer) and Opsumit (endothelin receptor antagonist).  -Spoke with Dr. Frederik Pear (pulmonologist) with concern that patient may go into fulminant heart failure with out these medications, he assured me that with PO, NO sensitizers he did not have to worry about fulminant heart failure when withdrawn abruptly, . -Patient currently subtherapeutic on Coumadin. Admission INR= 1.62 , 2/16 INR= 2.0  -Coumadin per pharmacy  -Will restart patient's Adempas(vasodilator;NO sensitizer) and Opsumit (endothelin receptor antagonist) on discharge. Now that patient has agreed to take her medication as prescribed   Chronic respiratory failure  -Continue patient on home regimen of 4 L via [place]   Hypothyroidism  - patient's home Synthroid dose is 157mcg daily PO  Code Status: full code Family Communication: discussed with the daughter at bedside Disposition Plan: to inpatient psychiatry when stable   Consultants:  psychiatry  Procedures:  echocardiogram  Antibiotics:  none  HPI/Subjective: Pleasant. No agitated. Denies any pain .   Objective: Filed Vitals:   10/18/13 1000  BP:   Pulse: 80  Temp:   Resp: 30    Intake/Output Summary (Last 24 hours) at 10/18/13 1101 Last data filed at 10/18/13 1000  Gross per 24 hour  Intake   2485 ml  Output    400 ml  Net   2085 ml   Filed Weights   10/15/13 2000 10/18/13 0400  Weight: 67.6 kg (149 lb 0.5 oz) 72.6 kg (160 lb 0.9 oz)    Exam:  General: A./O. x4, NAD  Cardiovascular: Regular rhythm and rate, negative murmurs rubs gallops, DP/PT pulse 2+ bilateral  Respiratory: Clear  to auscultation bilateral  Abdomen: Soft, nontender, nondistended, plus bowel sound  Musculoskeletal: Negative pedal edema    Data Reviewed: Basic Metabolic Panel:  Recent Labs Lab 10/15/13 1205 10/16/13 0555 10/17/13 0318 10/18/13 0340  NA 140 142 138  136*  K 4.1 4.7 4.1 4.5  CL 105 109 107 107  CO2 23 26 25 22   GLUCOSE 95 91 86 93  BUN 21 14 18 16   CREATININE 1.23* 1.16* 1.21* 1.05  CALCIUM 9.3 9.5 8.8 8.8  MG  --  1.9 1.8 1.7   Liver Function Tests:  Recent Labs Lab 10/15/13 1205 10/16/13 0555 10/17/13 0318 10/18/13 0340  AST 26 27 27 21   ALT 20 18 20 18   ALKPHOS 46 43 42 40  BILITOT 0.5 0.4 0.3 0.3  PROT 7.9 7.0 6.6 6.4  ALBUMIN 3.7 3.1* 3.0* 2.8*   No results found for this basename: LIPASE, AMYLASE,  in the last 168 hours No results found for this basename: AMMONIA,  in the last 168 hours CBC:  Recent Labs Lab 10/15/13 1205 10/16/13 0555 10/17/13 0318 10/18/13 0340  WBC 3.4* 3.3* 3.8* 3.8*  NEUTROABS  --  1.8 1.8 2.0  HGB 14.6 13.5 12.5 12.4  HCT 43.2 41.0 37.5 36.8  MCV 93.7 95.6 95.4 95.6  PLT 192 214 210 185   Cardiac Enzymes:  Recent Labs Lab 10/15/13 1205 10/15/13 2018 10/15/13 2355 10/16/13 0555  TROPONINI <0.30 <0.30 <0.30 <0.30   BNP (last 3 results)  Recent Labs  10/15/13 2018  PROBNP 599.3*   CBG: No results found for this basename: GLUCAP,  in the last 168 hours  Recent Results (from the past 240 hour(s))  MRSA PCR SCREENING     Status: None   Collection Time    10/15/13  6:56 PM      Result Value Ref Range Status   MRSA by PCR NEGATIVE  NEGATIVE Final   Comment:            The GeneXpert MRSA Assay (FDA     approved for NASAL specimens     only), is one component of a     comprehensive MRSA colonization     surveillance program. It is not     intended to diagnose MRSA     infection nor to guide or     monitor treatment for     MRSA infections.  CULTURE, BLOOD (ROUTINE X 2)     Status: None   Collection Time    10/15/13  8:10 PM      Result Value Ref Range Status   Specimen Description BLOOD LEFT HAND   Final   Special Requests BOTTLES DRAWN AEROBIC ONLY 4 CC   Final   Culture  Setup Time     Final   Value: 10/16/2013 01:43     Performed at [facility]    Culture     Final   Value:        BLOOD CULTURE RECEIVED NO GROWTH TO DATE CULTURE WILL BE HELD FOR 5 DAYS BEFORE ISSUING A FINAL NEGATIVE REPORT     Performed at [facility]   Report Status PENDING   Incomplete  CULTURE, BLOOD (ROUTINE X 2)     Status: None   Collection Time    10/15/13  8:18 PM      Result Value Ref Range Status   Specimen Description BLOOD RIGHT HAND   Final   Special Requests BOTTLES DRAWN AEROBIC ONLY 3 CC  Final   Culture  Setup Time     Final   Value: 10/16/2013 01:43     Performed at [facility]   Culture     Final   Value:        BLOOD CULTURE RECEIVED NO GROWTH TO DATE CULTURE WILL BE HELD FOR 5 DAYS BEFORE ISSUING A FINAL NEGATIVE REPORT     Performed at [facility]   Report Status PENDING   Incomplete  URINE CULTURE     Status: None   Collection Time    10/16/13 11:36 AM      Result Value Ref Range Status   Specimen Description URINE, CLEAN CATCH   Final   Special Requests NONE   Final   Culture  Setup Time     Final   Value: 10/16/2013 14:36     Performed at [facility]     Final   Value: 40,000 COLONIES/ML     Performed at [facility]   Culture     Final   Value: PROTEUS MIRABILIS     Performed at [facility]   Report Status 10/18/2013 FINAL   Final   Organism ID, Bacteria PROTEUS MIRABILIS   Final     Studies: No results found.  Scheduled Meds: . aspirin EC  81 mg Oral Daily  . B-complex with vitamin C  1 tablet Oral Daily  . calcium-vitamin D  1 tablet Oral BID  . lamoTRIgine  100 mg Oral QHS  . levothyroxine  112 mcg Oral QAC breakfast  . multivitamin with minerals  1 tablet Oral q morning - 10a  . OLANZapine  7.5 mg Oral QHS  . spironolactone  50 mg Oral Daily  . Tadalafil (PAH)  20 mg Oral Daily  . vitamin C  500 mg Oral Daily  . warfarin  2.5 mg Oral ONCE-1800  . Warfarin - Pharmacist Dosing Inpatient   Does not apply q1800  . ziprasidone  20 mg Intramuscular  Daily   Continuous Infusions: . sodium chloride 125 mL/hr at 10/18/13 1040    Active Problems:   HYPOTHYROIDISM   SCHIZOAFFECTIVE DISORDER   Chronic thromboembolic pulmonary hypertension   Chronic respiratory failure   Bipolar disorder   Altered mental state   Drug overdose    Time spent: 40 min    [facility] Pager [phone] If 7PM-7AM, please contact night-coverage at [email or web address], password [facility] 10/18/2013, 11:01 AM  LOS: 3 days

## 2013-10-18 NOTE — Progress Notes (Addendum)
[id number] Rosana Hoes, RN, BSN, CCM, [phone] Chart reviewed for update of needs and condition. Patient continues to exhibit verbally hostile behaviors is cooperating with meds today/seen by psych on [id number]/recommending inpatient care and ivc'ing patient if needed/ Daughter is aware but is refusing inpatient psych admission for her Mother.  Patient does live alone.  She is her own POA.

## 2013-10-18 NOTE — Progress Notes (Signed)
Name: Tanette Chauca MRN: [id number] DOB: 23-Jul-1941    ADMISSION DATE:  10/15/2013 CONSULTATION DATE:  10/18/2013  REFERRING MD :  Sherral Hammers PRIMARY SERVICE:  [facility]  CHIEF COMPLAINT: advice regarding PAH meds   HISTORY OF PRESENT ILLNESS:  73 y/o BF, never smoker, with PMH of schizoaffective disorder, PE (10/11), CTEPH & PAH on 4L O2 admitted 2/15 with psychotic episode.  Concern for medication non-compliance.     KEY EVENTS / STUDIES: 06/24/10 - dx of PE and Dx of inoperable CTEPH & [facility] 12/2010 - Cardiac cath at [facility] showed RA pressure 5, PA pressure of 85 / 25, wedge pressure of 8, cardiac output was 4.2 L with an index of 2.3 and PVR of 9.640 and 08/19/10 - Venous dopplers neg  11/28/10 - Echocardiogram: Severely elevated RV sys pressure with dilated R Ht est at 99 with nl LV  11/05/10 - VQ Mod to high prob PE > referred to [facility] 12/19/2010. started on 02 24 h/day 2 lpm at rest , 5 lpm with activity  PFT's (minimal airflow obst with dlco =51%).  01/2011 - She was placed on tracclear with addition of tadalafil in November 2012. She was switched from tadalafil  To rociguat seventh 2014, macitentan replaced bosentan November 2014. 05/2013 - ECHO at [facility]: mild RV dysfunction 05/2013 - hx of SDH after a fall, maintained on coumadin  ............................................................................................................. 2/15 - admit to [facility] with psychotic episode   SUBJECTIVE: required high flow O2 briefly today   Daughter reportedly upset that her [place] meds had been changed  VITAL SIGNS: Temp:  [97.4 F (36.3 C)-98.1 F (36.7 C)] 97.5 F (36.4 C) (02/18 0800) Pulse Rate:  [68-84] 68 (02/18 1100) Resp:  [17-36] 21 (02/18 1100) BP: (100-144)/(55-90) 105/58 mmHg (02/18 0800) SpO2:  [88 %-100 %] 93 % (02/18 1100) Weight:  [72.6 kg (160 lb 0.9 oz)] 72.6 kg (160 lb 0.9 oz) (02/18 0400)  PHYSICAL EXAMINATION: Gen. Pleasant, well-nourished, in no distress, normal affect ENT:   no lesions, no post nasal drip Neck: No JVD, no thyromegaly, no carotid bruits Lungs: no use of accessory muscles, no dullness to percussion, clear without rales or rhonchi  Cardiovascular: Rhythm regular, s1 nml, loud P2, no murmurs, no peripheral edema Abdomen: soft and non-tender, no hepatosplenomegaly, BS normal. Musculoskeletal: No deformities, no cyanosis or clubbing Neuro:  alert, non focal Skin:  Warm, no lesions/ rash    Recent Labs Lab 10/16/13 0555 10/17/13 0318 10/18/13 0340  NA 142 138 136*  K 4.7 4.1 4.5  CL 109 107 107  CO2 26 25 22   BUN 14 18 16   CREATININE 1.16* 1.21* 1.05  GLUCOSE 91 86 93    Recent Labs Lab 10/16/13 0555 10/17/13 0318 10/18/13 0340  HGB 13.5 12.5 12.4  HCT 41.0 37.5 36.8  WBC 3.3* 3.8* 3.8*  PLT 214 210 185   No results found.  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 -if she is willing can resume home meds on discharge -Oxygen as be continued -Lasix 40 mg per day and Aldactone can be continued -We may have to reconsider use of Coumadin if she has more falls  PCCM to sign off  ALVA,RAKESH V.  [phone] 10/18/2013, 11:15 AM

## 2013-10-18 NOTE — Progress Notes (Signed)
Clinical Social Work  CSW received a call from patient's dtr [name]) who reports she wanted to know if the IVC situation was resolved. CSW explained that attending MD and psych MD felt that patient requires inpatient psych treatment at [place] and that is why patient was placed under IVC. Dtr reports she spoke to patient experience and was informed that dtr needed to talk with RN to get IVC rescinded. CSW explained that only MD can rescind IVC paperwork. Dtr reports frustration and states that she called 911 and she should have the right to say if patient needs inpatient services or not. Dtr reports patient is scheduled to see her psychiatrist at [facility] on Friday and that he can manage her medications. Dtr reports that CSW is unhelpful and she has no desire to talk with again unless CSW will change IVC paperwork. CSW explained that once patient is medically cleared that CSW could assist with finding inpatient psych placement and will keep dtr updated so she is aware of DC plans.  CSW spoke with attending MD who reports patient is not medically stable today but will possibly ready to DC tomorrow. CSW will continue to follow.  [place], [place] [phone]

## 2013-10-18 NOTE — Progress Notes (Signed)
Clinical Social Work  Per chart review, IVC paperwork had been faxed to [place] on 10/17/13 but [employer] still has not served patient. IVC paperwork renewed and sent to Magistrate. CSW spoke directly to [place] who confirmed IVC paperwork received and [employer] will serve today.  CSW will continue to follow.  [facility],  [phone]

## 2013-10-19 ENCOUNTER — Encounter (HOSPITAL_COMMUNITY): Payer: Self-pay | Admitting: *Deleted

## 2013-10-19 LAB — COMPREHENSIVE METABOLIC PANEL
ALBUMIN: 2.9 g/dL — AB (ref 3.5–5.2)
ALT: 17 U/L (ref 0–35)
AST: 20 U/L (ref 0–37)
Alkaline Phosphatase: 42 U/L (ref 39–117)
BUN: 11 mg/dL (ref 6–23)
CHLORIDE: 104 meq/L (ref 96–112)
CO2: 23 mEq/L (ref 19–32)
CREATININE: 0.99 mg/dL (ref 0.50–1.10)
Calcium: 9.1 mg/dL (ref 8.4–10.5)
GFR calc Af Amer: 65 mL/min — ABNORMAL LOW (ref >90)
GFR calc non Af Amer: 56 mL/min — ABNORMAL LOW (ref >90)
GLUCOSE: 90 mg/dL (ref 70–99)
POTASSIUM: 4.4 meq/L (ref 3.7–5.3)
Sodium: 137 mEq/L (ref 137–147)
Total Bilirubin: 0.6 mg/dL (ref 0.3–1.2)
Total Protein: 6.7 g/dL (ref 6.0–8.3)

## 2013-10-19 LAB — CBC WITH DIFFERENTIAL/PLATELET
BASOS PCT: 0 % (ref 0–1)
Basophils Absolute: 0 10*3/uL (ref 0.0–0.1)
Eosinophils Absolute: 0.1 10*3/uL (ref 0.0–0.7)
Eosinophils Relative: 2 % (ref 0–5)
HCT: 38.8 % (ref 36.0–46.0)
Hemoglobin: 13 g/dL (ref 12.0–15.0)
LYMPHS PCT: 36 % (ref 12–46)
Lymphs Abs: 1.8 10*3/uL (ref 0.7–4.0)
MCH: 31.8 pg (ref 26.0–34.0)
MCHC: 33.5 g/dL (ref 30.0–36.0)
MCV: 94.9 fL (ref 78.0–100.0)
Monocytes Absolute: 0.4 10*3/uL (ref 0.1–1.0)
Monocytes Relative: 7 % (ref 3–12)
NEUTROS ABS: 2.8 10*3/uL (ref 1.7–7.7)
Neutrophils Relative %: 55 % (ref 43–77)
PLATELETS: 186 10*3/uL (ref 150–400)
RBC: 4.09 MIL/uL (ref 3.87–5.11)
RDW: 14.6 % (ref 11.5–15.5)
WBC: 5 10*3/uL (ref 4.0–10.5)

## 2013-10-19 LAB — PROTIME-INR
INR: 1.6 — AB (ref 0.00–1.49)
PROTHROMBIN TIME: 18.6 s — AB (ref 11.6–15.2)

## 2013-10-19 LAB — MAGNESIUM: MAGNESIUM: 1.6 mg/dL (ref 1.5–2.5)

## 2013-10-19 MED ORDER — LORAZEPAM 2 MG/ML IJ SOLN: 1.0000 mg | Freq: Four times a day (QID) | INTRAMUSCULAR | Status: DC | PRN

## 2013-10-19 MED ORDER — WARFARIN SODIUM 5 MG PO TABS
5.0000 mg | ORAL_TABLET | Freq: Once | ORAL | Status: DC
  Filled 2013-10-19: qty 1

## 2013-10-19 NOTE — Telephone Encounter (Signed)
lmtcb x3 on phone # [phone].

## 2013-10-19 NOTE — Progress Notes (Signed)
Clinical Social Work  Per MD, patient is stable and requiring inpatient psych placement.  CSW contacted the following facilties re: placement:  [facility]- faxed referral  [facility]- left a message inquiring about bed availability  [facility]- no available beds  [facility]-  left a message inquiring about bed availability  [facility]- no available beds  [facility]- faxed referral  [facility]- requested that CSW call back around 3pm  CSW will continue to follow.  [place], Michaela Horton [phone]

## 2013-10-19 NOTE — Discharge Summary (Addendum)
Physician Discharge Summary  Michaela Horton TML:[id number] DOB: Nov 17, 1940 DOA: 10/15/2013  PCP: Michaela Heck, Horton  Admit date: 10/15/2013 Discharge date: 10/19/2013  Time spent: 50 minutes  Recommendations for Outpatient Follow-up:  1. Follow up with PCP in one week 2. Follow up with pulmonologist as recommended 3. Follow up with psychiatrist in one week.  Discharge Diagnoses:  Active Problems:   HYPOTHYROIDISM   SCHIZOAFFECTIVE DISORDER   Chronic thromboembolic pulmonary hypertension   Chronic respiratory failure   Bipolar disorder   Altered mental state   Drug overdose   Discharge Condition: improved.   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 Presented to emergency department altered mental status. The patient's daughter reports the patient lives alone and has been "messing around with her home medication" and non-compliant with her Home oxygen regimen, 4L [place]. On the evening of 2/17 pt became agitated, and started being combative at the medical staff. GEODON was given. She is more calmer today but still very emotional, refuses to take the trial medications given at [facility]. Patient's Michaela Horton who is a physician out of town, wanted to take the patient home. Patient's daughter Ms Michaela Horton is her caregiver and reported that she will be her 24 hour care giver if she is discharged home.    Hospital Course:  Acute encephalopathy: probably sec to Drug overdose  -UDS is negative.  -Ethanol level negative  - lamictal level normal.  Schizoaffective disorder.  Bipolar  - she was agitated and was combative to the staff. She was given geodon and she has calmed down. psychaitry consulted and   paperwork for involuntary commitment (IVC) has been completed. Spoke with her psychiatrist Michaela Horton who also left messages for the daughter recommending inpatient psychiatry facility admission.  - she is medically stable to be discharged to the facility. -PT Michaela Horton is a physician and said he is the HCPOA, and he would take the responsibility of taking care of his mother at homeand makes sure she will take her medications and make her appointments and provide her 24 hour care at home for her safety and well being.  Altered mental status  -Most likely secondary to 1, 2, 3 however given patient's age and risk factors will need to rule out CVA. -MRI/MRA; DONE, did not show any stroke. Her mental status has improved and she is oriented.  -blood culture NGTD  -urine culture shows proteus, 40,000 colonies.  -TSH WNL  Chronic diastolic CHF  -appears to be compensated.   -Troponin x3 negative  -ProBNP= 599  -Echocardiogram  Chronic thromboembolic pulmonary hypertension  -Patient on Adempas(vasodilator;NO sensitizer) and Opsumit (endothelin receptor antagonist).  -Spoke with Michaela Horton (pulmonologist) with concern that patient may go into fulminant heart failure with out these medications, he assured me that with PO, NO sensitizers he did not have to worry about fulminant heart failure when withdrawn abruptly, .  -Patient currently subtherapeutic on Coumadin. Admission INR= 1.62 , 2/16 INR= 1.6  -Will restart patient's Adempas(vasodilator;NO sensitizer) and Opsumit (endothelin receptor antagonist) on discharge if she agrees to take them.  Chronic respiratory failure  -Continue patient on home regimen of 6 L via Michaela Horton  Hypothyroidism  -  patient's home Synthroid dose is 153mg daily PO      Procedures:    Consultations:  PCCM  psychiatry  Discharge Exam: Filed Vitals:   10/19/13 0600  BP: 138/71  Pulse: 82  Temp:   Resp: 22    General: alert afebrile comfortable Cardiovascular:  s1s2 Respiratory: ctab  Discharge Instructions  Discharge Orders   Future Appointments Provider Department Dept Phone   10/30/2013 11:00 AM Michaela Horton [facility] [phone]  01/12/2014 2:30 PM [facility] [phone]  01/12/2014 3:00 PM [phone]  Future Orders Complete By Expires   ([place]:  Anytime you have any of the following symptoms: 1) 3 pound weight gain in 24 hours or 5 pounds in 1 week 2) shortness of breath, with or without a dry hacking cough 3) swelling in the hands, feet or stomach 4) if you have to sleep on extra pillows at night in order to breathe.  As directed    Call Horton for:  difficulty breathing, headache or visual disturbances  As directed    Call Horton for:  persistant dizziness or light-headedness  As directed    Discharge instructions  As directed    Comments:     Follow up with [facility] am.  Follow up with PCP in one week [facility].  Patinet has not been taking Adempas and Opsumit for PAH, for a few weeks now.  She was switched to Tracleer and adcirca in the hospital. You can go back to Adempas and Opsumit on discharge and make sure you are compliant to the medications.       Medication List    STOP taking these medications       busPIRone 5 MG tablet  Commonly known as:  BUSPAR     LORazepam 1 MG tablet  Commonly known as:  ATIVAN      TAKE these medications       ADEMPAS 2.5 MG Tabs  Generic drug:  Riociguat  Take 2.5 mg by mouth 3 (three) times daily.     B-complex with vitamin C tablet  Take 1 tablet by mouth daily.     calcium-vitamin D 500-200 MG-UNIT per tablet  Commonly known as:  OSCAL WITH D  Take 1 tablet by mouth 2 (two) times daily.     furosemide 20 MG tablet  Commonly known as:  LASIX  Take 20 mg by mouth  daily as needed for fluid.     lamoTRIgine 100 MG tablet  Commonly known as:  LAMICTAL  Take 100 mg by mouth at bedtime.     levothyroxine 112 MCG tablet  Commonly known as:  SYNTHROID, LEVOTHROID  Take 112 mcg by mouth daily before breakfast.     lidocaine 5 %  Commonly known as:  LIDODERM  Place 1 patch onto the skin daily as needed.     multivitamin with minerals Tabs tablet  Take 1 tablet by mouth every morning.     OLANZapine 7.5 MG tablet  Commonly known as:  ZYPREXA  Take 7.5 mg by mouth at bedtime.     OPSUMIT 10 MG Tabs  Generic drug:  Macitentan  Take 10 mg by mouth daily.     RESTASIS 0.05 % ophthalmic emulsion  Generic drug:  cycloSPORINE  Place 1 drop into both eyes every 12 (twelve)  hours.     spironolactone 50 MG tablet  Commonly known as:  ALDACTONE  Take 50 mg by mouth daily.     temazepam 15 MG capsule  Commonly known as:  RESTORIL  Take 30 mg by mouth at bedtime.     vitamin C 500 MG tablet  Commonly known as:  ASCORBIC ACID  Take 500 mg by mouth daily.     warfarin 5 MG tablet  Commonly known as:  COUMADIN  Take 5-7.5 mg by mouth one time only at 6 PM. 5 mg Tuesday and Thursday, 7.5 mg Monday, Wednesday, Friday, Saturday, and sunday       No Known Allergies    The results of significant diagnostics from this hospitalization (including imaging, microbiology, ancillary and laboratory) are listed below for reference.    Significant Diagnostic Studies: Mr Virgel Paling Wo Contrast  10/16/2013   CLINICAL DATA:  Recent onset of confusion. Patient is anticoagulated. Stroke risk factors include hypertension.  EXAM: MRI HEAD WITHOUT CONTRAST  MRA HEAD WITHOUT CONTRAST  TECHNIQUE: Multiplanar, multiecho pulse sequences of the brain and surrounding structures were obtained without intravenous contrast. Angiographic images of the head were obtained using MRA technique without contrast.  COMPARISON:  DG CERVICAL SPINE 2-3 VIEWS dated 08/02/2013; CT HEAD W/O CM  dated 06/07/2013; CT HEAD W/O CM dated 05/01/2013; CT HEAD W/O CM dated 04/29/2013  FINDINGS: MRI HEAD FINDINGS  No evidence for acute infarction, hemorrhage, mass lesion, hydrocephalus, or extra-axial fluid. Moderate cerebral and cerebellar atrophy. Chronic microvascular ischemic change affects the periventricular and subcortical white matter. No large vessel infarct. No significant lacunar infarct. Flow voids are maintained throughout the carotid, basilar, and vertebral arteries. There are no areas of chronic hemorrhage. Pituitary and cerebellar tonsils unremarkable. Mild cervical spondylosis. No osseous lesions. Visualized calvarium, skull base, and upper cervical osseous structures unremarkable. Scalp and extracranial soft tissues, orbits, sinuses, and mastoids show no acute process.  MRA HEAD FINDINGS  The internal carotid arteries are widely patent. The basilar artery is widely patent with vertebrals codominant. The anterior, middle, and posterior cerebral arteries are widely patent. There is no cerebellar branch occlusion. There is moderate dolichoectasia consistent with chronic hypertension. Shallow outpouching from the inferior cavernous segment right ICA represents minor atheromatous aneurysmal dilatation. There is no intracranial stenosis or berry aneurysm.  IMPRESSION: Chronic changes as described.  No acute intracranial abnormality.  Dolichoectasia intracranial vascular structures without proximal stenosis.   Electronically Signed   By: Rolla Flatten M.D.   On: 10/16/2013 09:21   Mr Brain Wo Contrast  10/16/2013   CLINICAL DATA:  Recent onset of confusion. Patient is anticoagulated. Stroke risk factors include hypertension.  EXAM: MRI HEAD WITHOUT CONTRAST  MRA HEAD WITHOUT CONTRAST  TECHNIQUE: Multiplanar, multiecho pulse sequences of the brain and surrounding structures were obtained without intravenous contrast. Angiographic images of the head were obtained using MRA technique without contrast.   COMPARISON:  DG CERVICAL SPINE 2-3 VIEWS dated 08/02/2013; CT HEAD W/O CM dated 06/07/2013; CT HEAD W/O CM dated 05/01/2013; CT HEAD W/O CM dated 04/29/2013  FINDINGS: MRI HEAD FINDINGS  No evidence for acute infarction, hemorrhage, mass lesion, hydrocephalus, or extra-axial fluid. Moderate cerebral and cerebellar atrophy. Chronic microvascular ischemic change affects the periventricular and subcortical white matter. No large vessel infarct. No significant lacunar infarct. Flow voids are maintained throughout the carotid, basilar, and vertebral arteries. There are no areas of chronic hemorrhage. Pituitary and cerebellar tonsils unremarkable. Mild cervical spondylosis. No osseous lesions. Visualized calvarium, skull  base, and upper cervical osseous structures unremarkable. Scalp and extracranial soft tissues, orbits, sinuses, and mastoids show no acute process.  MRA HEAD FINDINGS  The internal carotid arteries are widely patent. The basilar artery is widely patent with vertebrals codominant. The anterior, middle, and posterior cerebral arteries are widely patent. There is no cerebellar branch occlusion. There is moderate dolichoectasia consistent with chronic hypertension. Shallow outpouching from the inferior cavernous segment right ICA represents minor atheromatous aneurysmal dilatation. There is no intracranial stenosis or berry aneurysm.  IMPRESSION: Chronic changes as described.  No acute intracranial abnormality.  Dolichoectasia intracranial vascular structures without proximal stenosis.   Electronically Signed   By: Rolla Flatten M.D.   On: 10/16/2013 09:21   Dg Chest Port 1 View  10/15/2013   CLINICAL DATA:  ams  EXAM: PORTABLE CHEST - 1 VIEW  COMPARISON:  DG BONE SURVEY MET dated 07/19/2013; DG CHEST 2V dated 03/30/2013  FINDINGS: Low lung volumes. Cardiac silhouette is enlarged. It was tortuous and ectatic. There is prominence of the interstitial markings. Is not appear to be significant peribronchial cuffing.  There are areas concerning for mild bronchiectasis within the right middle lobe and lingula regions. The osseous structures demonstrate no acute abnormalities.  IMPRESSION: Low lung volumes which accentuate the pulmonary findings. There is not appear to be evidence of acute cardiopulmonary disease. There are interstitial changes likely changes.   Electronically Signed   By: Margaree Mackintosh M.D.   On: 10/15/2013 12:20    Microbiology: Recent Results (from the past 240 hour(s))  MRSA PCR SCREENING     Status: None   Collection Time    10/15/13  6:56 PM      Result Value Ref Range Status   MRSA by PCR NEGATIVE  NEGATIVE Final   Comment:            The GeneXpert MRSA Assay (FDA     approved for NASAL specimens     only), is one component of a     comprehensive MRSA colonization     surveillance program. It is not     intended to diagnose MRSA     infection nor to guide or     monitor treatment for     MRSA infections.  CULTURE, BLOOD (ROUTINE X 2)     Status: None   Collection Time    10/15/13  8:10 PM      Result Value Ref Range Status   Specimen Description BLOOD LEFT HAND   Final   Special Requests BOTTLES DRAWN AEROBIC ONLY 4 CC   Final   Culture  Setup Time     Final   Value: 10/16/2013 01:43     Performed at [facility]   Culture     Final   Value:        BLOOD CULTURE RECEIVED NO GROWTH TO DATE CULTURE WILL BE HELD FOR 5 DAYS BEFORE ISSUING A FINAL NEGATIVE REPORT     Performed at [facility]   Report Status PENDING   Incomplete  CULTURE, BLOOD (ROUTINE X 2)     Status: None   Collection Time    10/15/13  8:18 PM      Result Value Ref Range Status   Specimen Description BLOOD RIGHT HAND   Final   Special Requests BOTTLES DRAWN AEROBIC ONLY 3 CC   Final   Culture  Setup Time     Final   Value: 10/16/2013 01:43     Performed  at [facility]   Culture     Final   Value:        BLOOD CULTURE RECEIVED NO GROWTH TO DATE CULTURE WILL BE HELD FOR 5 DAYS  BEFORE ISSUING A FINAL NEGATIVE REPORT     Performed at [facility]   Report Status PENDING   Incomplete  URINE CULTURE     Status: None   Collection Time    10/16/13 11:36 AM      Result Value Ref Range Status   Specimen Description URINE, CLEAN CATCH   Final   Special Requests NONE   Final   Culture  Setup Time     Final   Value: 10/16/2013 14:36     Performed at [facility] Count     Final   Value: 40,000 COLONIES/ML     Performed at [facility]   Culture     Final   Value: PROTEUS MIRABILIS     Performed at [facility]   Report Status 10/18/2013 FINAL   Final   Organism ID, Bacteria PROTEUS MIRABILIS   Final     Labs: Basic Metabolic Panel:  Recent Labs Lab 10/15/13 1205 10/16/13 0555 10/17/13 0318 10/18/13 0340 10/19/13 0320  NA 140 142 138 136* 137  K 4.1 4.7 4.1 4.5 4.4  CL 105 109 107 107 104  CO2 _0 GLUCOSE 95 91 86 93 90  BUN _1 CREATININE 1.23* 1.16* 1.21* 1.05 0.99  CALCIUM 9.3 9.5 8.8 8.8 9.1  MG  --  1.9 1.8 1.7 1.6   Liver Function Tests:  Recent Labs Lab 10/15/13 1205 10/16/13 0555 10/17/13 0318 10/18/13 0340 10/19/13 0320  AST _2 ALT _3 ALKPHOS 46 43 42 40 42  BILITOT 0.5 0.4 0.3 0.3 0.6  PROT 7.9 7.0 6.6 6.4 6.7  ALBUMIN 3.7 3.1* 3.0* 2.8* 2.9*   No results found for this basename: LIPASE, AMYLASE,  in the last 168 hours No results found for this basename: AMMONIA,  in the last 168 hours CBC:  Recent Labs Lab 10/15/13 1205 10/16/13 0555 10/17/13 0318 10/18/13 0340 10/19/13 0320  WBC 3.4* 3.3* 3.8* 3.8* 5.0  NEUTROABS  --  1.8 1.8 2.0 2.8  HGB 14.6 13.5 12.5 12.4 13.0  HCT 43.2 41.0 37.5 36.8 38.8  MCV 93.7 95.6 95.4 95.6 94.9  PLT 192 214 210 185 186   Cardiac Enzymes:  Recent Labs Lab 10/15/13 1205 10/15/13 2018 10/15/13 2355 10/16/13 0555  TROPONINI <0.30 <0.30 <0.30 <0.30   BNP: BNP (last 3 results)  Recent  Labs  10/15/13 2018  PROBNP 599.3*   CBG: No results found for this basename: GLUCAP,  in the last 168 hours     Signed:  Liberty Seto  [facility] 10/19/2013, 12:21 PM

## 2013-10-19 NOTE — Progress Notes (Signed)
Clinical Social Work  CSW staffed case with attending MD who reports she will talk with psych MD and family re: DC plans. MD reports she will contact CSW once determined if patient is medically stable to DC to inpatient psych facility vs DC home with family. CSW will continue to follow and assist as needed.  [place], [place] [phone]

## 2013-10-19 NOTE — Progress Notes (Signed)
Clinical Social Work  CSW received a call from attending MD who reports that she wants to rescind IVC and allow patient to go home with the care of her family. MD signed "Notice of Commitment Change" form which was faxed to the [facility] office. Patient has follow up appointment tomorrow with [facility]. CSW alerted hospitals that patient was no longer in need of placement. CSW is signing off but available if needed.  [place], [place] [phone]

## 2013-10-19 NOTE — Progress Notes (Signed)
CARE MANAGEMENT NOTE 10/19/2013  Patient:  Michaela Horton, Michaela Horton   Account Number:  [id number]  Date Initiated:  10/16/2013  Documentation initiated by:  Darriel Sinquefield  Subjective/Objective Assessment:   pt with intentional overdose of meds.  hx of psych problems,has two duaghters, lives alone with supervision by the daughters.     Action/Plan:   tbd, psych consult?  Daughters may need family support resources for addictive and behavioral problem parents.   Anticipated DC Date:  10/22/2013   Anticipated DC Plan:  [facility] referral  Clinical Social Worker      DC Planning Services  NA      [facility] Choice  NA   Choice offered to / List presented to:  NA   DME arranged  NA      DME agency  NA     [place] arranged  NA      [place] agency  NA   Status of service:  In process, will continue to follow Medicare Important Message given?  NA - LOS <3 / Initial given by admissions (If response is "NO", the following Medicare IM given date fields will be blank) Date Medicare IM given:   Date Additional Medicare IM given:    Discharge Disposition:    Per UR Regulation:  Reviewed for med. necessity/level of care/duration of stay  If discussed at [place] of Stay Meetings, dates discussed:    Comments:  [id number]/Andreka Stucki Eldridge Dace, BSN, [place] [phone] Chart Reviewed for discharge and hospital needs. Discharge needs at time of review:  None present will follow for needs. Review of patient progress due on [id number] Patient being ivc'd due to psychotic behaviors, Psych has attempted to contact family.  Patient is her own POA and lives alone.  Patient moved from 1224 to 1514.   [id number]/Audri Kozub Rosana Hoes, RN, BSN, [place], [phone] Chart reviewed for update of needs and condition. Patient continues to exhibit verbally hostile behaviors( tore her iv tubing in half on [id number]) is cooperating with meds today/seen by psych on [id number]/recommending inpatient care and ivc'ing patient  if needed/ Daughter is aware but is refusing inpatient psych admission for her Mother.  Patient does live alone.  She is her own [place], RN, BSN, CCM, [phone] Chart reviewed for update of needs and condition. patient is refusing to take im meds but did cooperate after much coaxing and antivan iv/patient did become very aggressive to the staff pm of [id number]/psych is aware of behaviors.  Patient is okayed to be transferred to medical telemetry floor.  [place], RN, BSN, CCM [phone] Chart Reviewed for discharge and hospital needs. Discharge needs at time of review:  None present will follow for needs. Review of patient progress due on [id number] [place]: [place] the [place] www.adsyes.org [facility] [employer] guide.

## 2013-10-19 NOTE — Progress Notes (Signed)
Patient and son given discharge instructions, with son able to verbalize discharge instructions.  Patient not fully able to understand instructions, with inappropriate questions, and lack of ability to show any kind of comprehension.  Son fully aware of discharge plan, and is agreeable to discharge.  Patient being discharged in stable medical condition, with home meds sent home with son.  Patient's son aware also that patient refused medications this morning.  Durwin Nora RN

## 2013-10-19 NOTE — Progress Notes (Signed)
ANTICOAGULATION CONSULT NOTE - Follow up [place] for Warfarin Indication: h/o PE  No Known Allergies  Patient Measurements: Height: 5\' 2"  (157.5 cm) Weight: 157 lb 3 oz (71.3 kg) IBW/kg (Calculated) : 50.1  Vital Signs: Temp: 98.7 F (37.1 C) (02/19 0400) Temp src: Oral (02/19 0400) BP: 138/71 mmHg (02/19 0600) Pulse Rate: 82 (02/19 0600)  Labs:  Recent Labs  10/17/13 0318 10/18/13 0340 10/19/13 0320  HGB 12.5 12.4 13.0  HCT 37.5 36.8 38.8  PLT 210 185 186  LABPROT 26.7* 22.4* 18.6*  INR 2.57* 2.04* 1.60*  CREATININE 1.21* 1.05 0.99    Estimated Creatinine Clearance: 48.2 ml/min (by C-G formula based on Cr of 0.99).   Assessment: 72 yoF admitted 2/15 with schizoeffective disorder and bipolar, pulmonary hypertension with CTEPH (inoperable) on warfarin PTA for h/o PE 2011.  She presents with AMS, agitated and violent, possible medication non-compliance.  Pharmacy consulted to manage warfarin.  Warfarin home dose = 7.5 mg daily except 5 mg on Tues/Thursday, last dose reported 2/14  INR 1.6, decreased and below therapeutic range  Warfarin dose 2/16 was NOT given.  Pt refused.  Warfarin dose 2.5mg  on 2/17 and 2/18 were given.  CBC:  Hgb 13, Plt 186.    Goal of Therapy:  INR 2-3 Monitor platelets by anticoagulation protocol: Yes   Plan:   Warfarin 5 mg po once tonight at 1800  Daily PT/INR.  Gretta Arab PharmD, BCPS Pager [phone] 10/19/2013 10:39 AM

## 2013-10-19 NOTE — Progress Notes (Addendum)
[facility] PROGRESS NOTE  Michaela Horton [id number] DOB: 31-Jul-1941 DOA: 10/15/2013 PCP: Gerrit Heck, MD Interim summary: 73 y/o BF, never smoker, with PMH of schizoaffective disorder, PE (10/11), CTEPH & PAH on 4L O2 admitted 2/15 with psychotic episode. Presented to emergency department altered mental status. The patient's daughter reports the patient lives alone and has been "messing around with her home medication" and non-compliant with her Home oxygen regimen, 4L [place]. On the evening of 2/17 pt became agitated, and started being combative at the medical staff. GEODON was given. She is more calmer today but still very emotional,  refuses to take the trial medications given at [facility]. Patient's son who is a physician out of town, wanted to take the patient home. Patient's daughter Ms Michaela Horton is her caregiver and reported that she will be her 24 hour care giver if she is discharged home.     Assessment/Plan: Acute encephalopathy: probably sec to Drug overdose  -UDS is negative. -Ethanol level negative  - lamictal level normal.  Schizoaffective disorder  -Patient has received 30 mg Geodon IM, -Consulted  psychiatry for help with short-term/long-term medication;  -she was involuntarily committed. [name] and son does not want the patient to go to inpatient psychiatry facility. They wanted their psychiatrist to make the call about that. Will call her psychiatrist [facility] and talk to him.  -Continue Ativan 1mg   IV q.6 hr PRN agitation  - Geodon for agitation .  - currently on zyprexa and she seems to be doing better.  Bipolar  -Geodon 30 mg IM at the time of agitation, Secondary to patient's violent behavior toward staff,.  -Do not believe patient is safe to return home.  paperwork for involuntary commitment (IVC) has been completed. Spoke with her psychiatrist Lissa Hoard shegart who also left messages for the daughter recommending inpatient psychiatry facility admission.   - she is medically stable to be discharged to the facility. We are transferred out of stepdown to regular bed today.  - called and left messaged for the son and daughter.  Altered mental status  -Most likely secondary to 1, 2, 3 however given patient's age and risk factors will need to rule out CVA. -MRI/MRA; DONE, did not show any stroke. Her mental status has improved and she is oriented.  - she is also medically stable to be transferred to med surg  Bed.  -blood culture NGTD  -urine culture shows proteus, 40,000 colonies.   -TSH WNL   Chronic diastolic CHF  -Daily a.m. Weights  -Strict I&O  -Troponin x3 negative  -ProBNP= 599  -Echocardiogram   Chronic thromboembolic pulmonary hypertension  -Patient on Adempas(vasodilator;NO sensitizer) and Opsumit (endothelin receptor antagonist).  -Spoke with Dr. Frederik Pear (pulmonologist) with concern that patient may go into fulminant heart failure with out these medications, he assured me that with PO, NO sensitizers he did not have to worry about fulminant heart failure when withdrawn abruptly, . -Patient currently subtherapeutic on Coumadin. Admission INR= 1.62 , 2/16 INR= 1.6 -Coumadin per pharmacy  -Will restart patient's Adempas(vasodilator;NO sensitizer) and Opsumit (endothelin receptor antagonist) on discharge if she agrees to take them.  Chronic respiratory failure  -Continue patient on home regimen of 6 L via [place]   Hypothyroidism  - patient's home Synthroid dose is 169mcg daily PO  Code Status: full code Family Communication: discussed with the daughter at bedside, son over the phone yesterday.  Disposition Plan: to inpatient psychiatry when stable   Consultants:  Psychiatry  pCCM  Procedures:  echocardiogram  Antibiotics:  none  HPI/Subjective: Teary eyed, and appears frustrated. No agitated. Denies any pain .   Objective: Filed Vitals:   10/19/13 0600  BP: 138/71  Pulse: 82  Temp:   Resp: 22     Intake/Output Summary (Last 24 hours) at 10/19/13 0821 Last data filed at 10/19/13 0600  Gross per 24 hour  Intake   3590 ml  Output   2300 ml  Net   1290 ml   Filed Weights   10/15/13 2000 10/18/13 0400 10/19/13 0400  Weight: 67.6 kg (149 lb 0.5 oz) 72.6 kg (160 lb 0.9 oz) 71.3 kg (157 lb 3 oz)    Exam:  General: A./O. x4, NAD  Cardiovascular: Regular rhythm and rate, negative murmurs rubs gallops, DP/PT pulse 2+ bilateral  Respiratory: Clear to auscultation bilateral  Abdomen: Soft, nontender, nondistended, plus bowel sound  Musculoskeletal: Negative pedal edema    Data Reviewed: Basic Metabolic Panel:  Recent Labs Lab 10/15/13 1205 10/16/13 0555 10/17/13 0318 10/18/13 0340 10/19/13 0320  NA 140 142 138 136* 137  K 4.1 4.7 4.1 4.5 4.4  CL 105 109 107 107 104  CO2 23 26 25 22 23   GLUCOSE 95 91 86 93 90  BUN 21 14 18 16 11   CREATININE 1.23* 1.16* 1.21* 1.05 0.99  CALCIUM 9.3 9.5 8.8 8.8 9.1  MG  --  1.9 1.8 1.7 1.6   Liver Function Tests:  Recent Labs Lab 10/15/13 1205 10/16/13 0555 10/17/13 0318 10/18/13 0340 10/19/13 0320  AST 26 27 27 21 20   ALT 20 18 20 18 17   ALKPHOS 46 43 42 40 42  BILITOT 0.5 0.4 0.3 0.3 0.6  PROT 7.9 7.0 6.6 6.4 6.7  ALBUMIN 3.7 3.1* 3.0* 2.8* 2.9*   No results found for this basename: LIPASE, AMYLASE,  in the last 168 hours No results found for this basename: AMMONIA,  in the last 168 hours CBC:  Recent Labs Lab 10/15/13 1205 10/16/13 0555 10/17/13 0318 10/18/13 0340 10/19/13 0320  WBC 3.4* 3.3* 3.8* 3.8* 5.0  NEUTROABS  --  1.8 1.8 2.0 2.8  HGB 14.6 13.5 12.5 12.4 13.0  HCT 43.2 41.0 37.5 36.8 38.8  MCV 93.7 95.6 95.4 95.6 94.9  PLT 192 214 210 185 186   Cardiac Enzymes:  Recent Labs Lab 10/15/13 1205 10/15/13 2018 10/15/13 2355 10/16/13 0555  TROPONINI <0.30 <0.30 <0.30 <0.30   BNP (last 3 results)  Recent Labs  10/15/13 2018  PROBNP 599.3*   CBG: No results found for this basename: GLUCAP,   in the last 168 hours  Recent Results (from the past 240 hour(s))  MRSA PCR SCREENING     Status: None   Collection Time    10/15/13  6:56 PM      Result Value Ref Range Status   MRSA by PCR NEGATIVE  NEGATIVE Final   Comment:            The GeneXpert MRSA Assay (FDA     approved for NASAL specimens     only), is one component of a     comprehensive MRSA colonization     surveillance program. It is not     intended to diagnose MRSA     infection nor to guide or     monitor treatment for     MRSA infections.  CULTURE, BLOOD (ROUTINE X 2)     Status: None   Collection Time    10/15/13  8:10 PM  Result Value Ref Range Status   Specimen Description BLOOD LEFT HAND   Final   Special Requests BOTTLES DRAWN AEROBIC ONLY 4 CC   Final   Culture  Setup Time     Final   Value: 10/16/2013 01:43     Performed at [facility]   Culture     Final   Value:        BLOOD CULTURE RECEIVED NO GROWTH TO DATE CULTURE WILL BE HELD FOR 5 DAYS BEFORE ISSUING A FINAL NEGATIVE REPORT     Performed at [facility]   Report Status PENDING   Incomplete  CULTURE, BLOOD (ROUTINE X 2)     Status: None   Collection Time    10/15/13  8:18 PM      Result Value Ref Range Status   Specimen Description BLOOD RIGHT HAND   Final   Special Requests BOTTLES DRAWN AEROBIC ONLY 3 CC   Final   Culture  Setup Time     Final   Value: 10/16/2013 01:43     Performed at [facility]   Culture     Final   Value:        BLOOD CULTURE RECEIVED NO GROWTH TO DATE CULTURE WILL BE HELD FOR 5 DAYS BEFORE ISSUING A FINAL NEGATIVE REPORT     Performed at [facility]   Report Status PENDING   Incomplete  URINE CULTURE     Status: None   Collection Time    10/16/13 11:36 AM      Result Value Ref Range Status   Specimen Description URINE, CLEAN CATCH   Final   Special Requests NONE   Final   Culture  Setup Time     Final   Value: 10/16/2013 14:36     Performed at [facility]     Final   Value: 40,000 COLONIES/ML     Performed at [facility]   Culture     Final   Value: PROTEUS MIRABILIS     Performed at [facility]   Report Status 10/18/2013 FINAL   Final   Organism ID, Bacteria PROTEUS MIRABILIS   Final     Studies: No results found.  Scheduled Meds: . aspirin EC  81 mg Oral Daily  . B-complex with vitamin C  1 tablet Oral Daily  . calcium-vitamin D  1 tablet Oral BID  . lamoTRIgine  100 mg Oral QHS  . levothyroxine  112 mcg Oral QAC breakfast  . multivitamin with minerals  1 tablet Oral q morning - 10a  . OLANZapine  7.5 mg Oral QHS  . spironolactone  50 mg Oral Daily  . Tadalafil (PAH)  20 mg Oral Daily  . vitamin C  500 mg Oral Daily  . Warfarin - Pharmacist Dosing Inpatient   Does not apply q1800   Continuous Infusions: . sodium chloride 125 mL/hr at 10/18/13 1800    Active Problems:   HYPOTHYROIDISM   SCHIZOAFFECTIVE DISORDER   Chronic thromboembolic pulmonary hypertension   Chronic respiratory failure   Bipolar disorder   Altered mental state   Drug overdose    Time spent: 35 min    [facility] Pager [phone] If 7PM-7AM, please contact night-coverage at [email or web address], password [facility] 10/19/2013, 8:21 AM  LOS: 4 days      ADDENDUM: Spoke to clay Shegart her psychiatrist about her hospitalization and agrees that she might need inpatient psychiatry  facility hospitalization. Tried to reach her son and daughter to update and go ahead with that.  Called bother the numbers and left messages.  Will try to reach them later this afternoon.  Updated Ms Earnest Bailey Education officer, museum.    Hosie Poisson, MD.

## 2013-10-20 ENCOUNTER — Other Ambulatory Visit: Payer: Self-pay | Admitting: Internal Medicine

## 2013-10-20 ENCOUNTER — Encounter: Payer: Self-pay | Admitting: Internal Medicine

## 2013-10-20 DIAGNOSIS — J9610 Chronic respiratory failure, unspecified whether with hypoxia or hypercapnia: Secondary | ICD-10-CM

## 2013-10-22 LAB — CULTURE, BLOOD (ROUTINE X 2)
Culture: NO GROWTH
Culture: NO GROWTH

## 2013-10-23 NOTE — Telephone Encounter (Signed)
Per protocol will sign off and await return call. Graford Bing, CMA

## 2013-10-27 ENCOUNTER — Emergency Department (HOSPITAL_COMMUNITY): Payer: Medicare Other

## 2013-10-27 ENCOUNTER — Emergency Department (HOSPITAL_COMMUNITY)
Admission: EM | Admit: 2013-10-27 | Discharge: 2013-10-31 | Disposition: A | Discharge status: Home / Self Care | Payer: Medicare Other | Attending: Emergency Medicine | Admitting: Emergency Medicine

## 2013-10-27 DIAGNOSIS — I2724 Chronic thromboembolic pulmonary hypertension: Secondary | ICD-10-CM

## 2013-10-27 DIAGNOSIS — N6009 Solitary cyst of unspecified breast: Secondary | ICD-10-CM

## 2013-10-27 DIAGNOSIS — S065X9A Traumatic subdural hemorrhage with loss of consciousness of unspecified duration, initial encounter: Secondary | ICD-10-CM

## 2013-10-27 DIAGNOSIS — D472 Monoclonal gammopathy: Secondary | ICD-10-CM

## 2013-10-27 DIAGNOSIS — E876 Hypokalemia: Secondary | ICD-10-CM

## 2013-10-27 DIAGNOSIS — J9611 Chronic respiratory failure with hypoxia: Secondary | ICD-10-CM | POA: Diagnosis present

## 2013-10-27 DIAGNOSIS — N281 Cyst of kidney, acquired: Secondary | ICD-10-CM

## 2013-10-27 DIAGNOSIS — F4325 Adjustment disorder with mixed disturbance of emotions and conduct: Secondary | ICD-10-CM

## 2013-10-27 DIAGNOSIS — S065XAA Traumatic subdural hemorrhage with loss of consciousness status unknown, initial encounter: Secondary | ICD-10-CM

## 2013-10-27 DIAGNOSIS — E039 Hypothyroidism, unspecified: Secondary | ICD-10-CM

## 2013-10-27 DIAGNOSIS — Z7901 Long term (current) use of anticoagulants: Secondary | ICD-10-CM | POA: Insufficient documentation

## 2013-10-27 DIAGNOSIS — T50901A Poisoning by unspecified drugs, medicaments and biological substances, accidental (unintentional), initial encounter: Secondary | ICD-10-CM

## 2013-10-27 DIAGNOSIS — M1990 Unspecified osteoarthritis, unspecified site: Secondary | ICD-10-CM

## 2013-10-27 DIAGNOSIS — I279 Pulmonary heart disease, unspecified: Secondary | ICD-10-CM | POA: Insufficient documentation

## 2013-10-27 DIAGNOSIS — Z79899 Other long term (current) drug therapy: Secondary | ICD-10-CM | POA: Insufficient documentation

## 2013-10-27 DIAGNOSIS — Z862 Personal history of diseases of the blood and blood-forming organs and certain disorders involving the immune mechanism: Secondary | ICD-10-CM | POA: Insufficient documentation

## 2013-10-27 DIAGNOSIS — F319 Bipolar disorder, unspecified: Secondary | ICD-10-CM

## 2013-10-27 DIAGNOSIS — R451 Restlessness and agitation: Secondary | ICD-10-CM

## 2013-10-27 DIAGNOSIS — N179 Acute kidney failure, unspecified: Secondary | ICD-10-CM

## 2013-10-27 DIAGNOSIS — F259 Schizoaffective disorder, unspecified: Secondary | ICD-10-CM

## 2013-10-27 DIAGNOSIS — Z8742 Personal history of other diseases of the female genital tract: Secondary | ICD-10-CM | POA: Insufficient documentation

## 2013-10-27 DIAGNOSIS — Z87448 Personal history of other diseases of urinary system: Secondary | ICD-10-CM | POA: Insufficient documentation

## 2013-10-27 DIAGNOSIS — J9610 Chronic respiratory failure, unspecified whether with hypoxia or hypercapnia: Secondary | ICD-10-CM

## 2013-10-27 DIAGNOSIS — IMO0002 Reserved for concepts with insufficient information to code with codable children: Secondary | ICD-10-CM | POA: Insufficient documentation

## 2013-10-27 DIAGNOSIS — Z98811 Dental restoration status: Secondary | ICD-10-CM | POA: Insufficient documentation

## 2013-10-27 DIAGNOSIS — R4689 Other symptoms and signs involving appearance and behavior: Secondary | ICD-10-CM

## 2013-10-27 DIAGNOSIS — D72819 Decreased white blood cell count, unspecified: Secondary | ICD-10-CM

## 2013-10-27 DIAGNOSIS — R4182 Altered mental status, unspecified: Secondary | ICD-10-CM

## 2013-10-27 DIAGNOSIS — Z86711 Personal history of pulmonary embolism: Secondary | ICD-10-CM | POA: Insufficient documentation

## 2013-10-27 LAB — ACETAMINOPHEN LEVEL

## 2013-10-27 LAB — CBC WITH DIFFERENTIAL/PLATELET
BASOS ABS: 0 10*3/uL (ref 0.0–0.1)
Basophils Relative: 0 % (ref 0–1)
Eosinophils Absolute: 0 10*3/uL (ref 0.0–0.7)
Eosinophils Relative: 1 % (ref 0–5)
HEMATOCRIT: 41 % (ref 36.0–46.0)
Hemoglobin: 13.5 g/dL (ref 12.0–15.0)
LYMPHS PCT: 42 % (ref 12–46)
Lymphs Abs: 2 10*3/uL (ref 0.7–4.0)
MCH: 30.7 pg (ref 26.0–34.0)
MCHC: 32.9 g/dL (ref 30.0–36.0)
MCV: 93.2 fL (ref 78.0–100.0)
MONO ABS: 0.4 10*3/uL (ref 0.1–1.0)
Monocytes Relative: 8 % (ref 3–12)
NEUTROS ABS: 2.3 10*3/uL (ref 1.7–7.7)
Neutrophils Relative %: 49 % (ref 43–77)
PLATELETS: 265 10*3/uL (ref 150–400)
RBC: 4.4 MIL/uL (ref 3.87–5.11)
RDW: 14.6 % (ref 11.5–15.5)
WBC: 4.8 10*3/uL (ref 4.0–10.5)

## 2013-10-27 LAB — COMPREHENSIVE METABOLIC PANEL
ALT: 23 U/L (ref 0–35)
AST: 37 U/L (ref 0–37)
Albumin: 4 g/dL (ref 3.5–5.2)
Alkaline Phosphatase: 44 U/L (ref 39–117)
BILIRUBIN TOTAL: 0.7 mg/dL (ref 0.3–1.2)
BUN: 23 mg/dL (ref 6–23)
CO2: 24 meq/L (ref 19–32)
Calcium: 9.6 mg/dL (ref 8.4–10.5)
Chloride: 97 mEq/L (ref 96–112)
Creatinine, Ser: 1.26 mg/dL — ABNORMAL HIGH (ref 0.50–1.10)
GFR calc Af Amer: 48 mL/min — ABNORMAL LOW (ref >90)
GFR calc non Af Amer: 42 mL/min — ABNORMAL LOW (ref >90)
GLUCOSE: 90 mg/dL (ref 70–99)
Potassium: 4.3 mEq/L (ref 3.7–5.3)
SODIUM: 134 meq/L — AB (ref 137–147)
Total Protein: 8.9 g/dL — ABNORMAL HIGH (ref 6.0–8.3)

## 2013-10-27 LAB — BLOOD GAS, ARTERIAL
ACID-BASE EXCESS: 1.2 mmol/L (ref 0.0–2.0)
BICARBONATE: 25.8 meq/L — AB (ref 20.0–24.0)
Drawn by: 235321
O2 Content: 5 L/min
O2 Saturation: 87.9 %
PCO2 ART: 42.8 mmHg (ref 35.0–45.0)
Patient temperature: 98.6
TCO2: 23 mmol/L (ref 0–100)
pH, Arterial: 7.396 (ref 7.350–7.450)
pO2, Arterial: 56.5 mmHg — ABNORMAL LOW (ref 80.0–100.0)

## 2013-10-27 LAB — PROTIME-INR
INR: 1.91 — ABNORMAL HIGH (ref 0.00–1.49)
Prothrombin Time: 21.3 seconds — ABNORMAL HIGH (ref 11.6–15.2)

## 2013-10-27 LAB — SALICYLATE LEVEL: Salicylate Lvl: <2 mg/dL — ABNORMAL LOW (ref 2.8–20.0)

## 2013-10-27 LAB — ETHANOL: Alcohol, Ethyl (B): <11 mg/dL (ref 0–11)

## 2013-10-27 LAB — PRO B NATRIURETIC PEPTIDE: PRO B NATRI PEPTIDE: 848.8 pg/mL — AB (ref 0–125)

## 2013-10-27 MED ORDER — RIOCIGUAT 2.5 MG PO TABS: 2.5000 mg | ORAL_TABLET | Freq: Three times a day (TID) | ORAL | Status: DC

## 2013-10-27 MED ORDER — CALCIUM CARBONATE-VITAMIN D 500-200 MG-UNIT PO TABS
1.0000 | ORAL_TABLET | Freq: Two times a day (BID) | ORAL | Status: DC
  Administered 2013-10-28 – 2013-10-30 (×4): 1 via ORAL
  Filled 2013-10-27 (×9): qty 1

## 2013-10-27 MED ORDER — SPIRONOLACTONE 50 MG PO TABS: 50.0000 mg | ORAL_TABLET | Freq: Every day | ORAL | Status: DC

## 2013-10-27 MED ORDER — MACITENTAN 10 MG PO TABS
10.0000 mg | ORAL_TABLET | Freq: Every day | ORAL | Status: DC
  Administered 2013-10-28: 10 mg via ORAL
  Administered 2013-10-29: 10:00:00 via ORAL
  Administered 2013-10-30: 10 mg via ORAL
  Filled 2013-10-27: qty 1

## 2013-10-27 MED ORDER — ZIPRASIDONE MESYLATE 20 MG IM SOLR
INTRAMUSCULAR | Status: AC
  Administered 2013-10-27: 10 mg
  Filled 2013-10-27: qty 20

## 2013-10-27 MED ORDER — LEVOTHYROXINE SODIUM 112 MCG PO TABS
112.0000 ug | ORAL_TABLET | Freq: Every day | ORAL | Status: DC
  Administered 2013-10-30: 112 ug via ORAL
  Filled 2013-10-27 (×5): qty 1

## 2013-10-27 MED ORDER — ZIPRASIDONE MESYLATE 20 MG IM SOLR
10.0000 mg | Freq: Four times a day (QID) | INTRAMUSCULAR | Status: DC | PRN
  Administered 2013-10-28 – 2013-10-31 (×4): 10 mg via INTRAMUSCULAR
  Filled 2013-10-27 (×6): qty 20

## 2013-10-27 MED ORDER — OLANZAPINE 2.5 MG PO TABS
7.5000 mg | ORAL_TABLET | Freq: Every day | ORAL | Status: DC
  Administered 2013-10-29: 7.5 mg via ORAL
  Filled 2013-10-27 (×4): qty 1

## 2013-10-27 MED ORDER — TEMAZEPAM 15 MG PO CAPS: 30.0000 mg | ORAL_CAPSULE | Freq: Every day | ORAL | Status: DC

## 2013-10-27 MED ORDER — LAMOTRIGINE 100 MG PO TABS
100.0000 mg | ORAL_TABLET | Freq: Every day | ORAL | Status: DC
  Administered 2013-10-30: 100 mg via ORAL
  Filled 2013-10-27 (×5): qty 1

## 2013-10-27 MED ORDER — B COMPLEX-C PO TABS
1.0000 | ORAL_TABLET | Freq: Every day | ORAL | Status: DC
  Administered 2013-10-28 – 2013-10-30 (×3): 1 via ORAL
  Filled 2013-10-27 (×4): qty 1

## 2013-10-27 MED ORDER — WARFARIN SODIUM 7.5 MG PO TABS
7.5000 mg | ORAL_TABLET | Freq: Once | ORAL | Status: DC
  Filled 2013-10-27: qty 1

## 2013-10-27 MED ORDER — LORAZEPAM 2 MG/ML IJ SOLN
1.0000 mg | Freq: Once | INTRAMUSCULAR | Status: DC
  Filled 2013-10-27: qty 1

## 2013-10-27 MED ORDER — IBUPROFEN 200 MG PO TABS: 600.0000 mg | ORAL_TABLET | Freq: Three times a day (TID) | ORAL | Status: DC | PRN

## 2013-10-27 MED ORDER — LORAZEPAM 1 MG PO TABS
1.0000 mg | ORAL_TABLET | Freq: Three times a day (TID) | ORAL | Status: DC | PRN
  Administered 2013-10-29 – 2013-10-31 (×4): 1 mg via ORAL
  Filled 2013-10-27 (×5): qty 1

## 2013-10-27 MED ORDER — WARFARIN SODIUM 5 MG PO TABS: 5.0000 mg | ORAL_TABLET | Freq: Once | ORAL | Status: DC

## 2013-10-27 MED ORDER — WARFARIN - PHARMACIST DOSING INPATIENT: Freq: Every day | Status: DC

## 2013-10-27 MED ORDER — DIPHENHYDRAMINE HCL 50 MG/ML IJ SOLN
INTRAMUSCULAR | Status: AC
  Administered 2013-10-27: 50 mg
  Filled 2013-10-27: qty 1

## 2013-10-27 MED ORDER — RIOCIGUAT 2.5 MG PO TABS
2.5000 mg | ORAL_TABLET | Freq: Three times a day (TID) | ORAL | Status: DC
  Administered 2013-10-28: 2.5 mg via ORAL
  Administered 2013-10-29: 17:00:00 via ORAL
  Administered 2013-10-29: 2.5 mg via ORAL
  Administered 2013-10-29: 10:00:00 via ORAL
  Administered 2013-10-30: 2.5 mg via ORAL
  Filled 2013-10-27: qty 1

## 2013-10-27 MED ORDER — MACITENTAN 10 MG PO TABS: 10.0000 mg | ORAL_TABLET | Freq: Every day | ORAL | Status: DC

## 2013-10-27 MED ORDER — ACETAMINOPHEN 325 MG PO TABS
650.0000 mg | ORAL_TABLET | ORAL | Status: DC | PRN
  Administered 2013-10-29: 650 mg via ORAL
  Filled 2013-10-27: qty 2

## 2013-10-27 MED ORDER — TEMAZEPAM 15 MG PO CAPS
15.0000 mg | ORAL_CAPSULE | Freq: Every day | ORAL | Status: DC
  Administered 2013-10-29 – 2013-10-30 (×2): 15 mg via ORAL
  Filled 2013-10-27 (×3): qty 1

## 2013-10-27 MED ORDER — VITAMIN C 500 MG PO TABS
500.0000 mg | ORAL_TABLET | Freq: Every day | ORAL | Status: DC
  Administered 2013-10-28 – 2013-10-30 (×3): 500 mg via ORAL
  Filled 2013-10-27 (×5): qty 1

## 2013-10-27 MED ORDER — ADULT MULTIVITAMIN W/MINERALS CH: 1.0000 | ORAL_TABLET | Freq: Every morning | ORAL | Status: DC

## 2013-10-27 MED ORDER — ADULT MULTIVITAMIN W/MINERALS CH
1.0000 | ORAL_TABLET | Freq: Every morning | ORAL | Status: DC
  Administered 2013-10-30: 1 via ORAL
  Filled 2013-10-27: qty 1

## 2013-10-27 MED ORDER — LORAZEPAM 2 MG/ML IJ SOLN
INTRAMUSCULAR | Status: AC
  Administered 2013-10-27: 2 mg
  Filled 2013-10-27: qty 1

## 2013-10-27 MED ORDER — SPIRONOLACTONE 50 MG PO TABS
50.0000 mg | ORAL_TABLET | Freq: Every day | ORAL | Status: DC
  Administered 2013-10-28 – 2013-10-30 (×3): 50 mg via ORAL
  Filled 2013-10-27 (×5): qty 1

## 2013-10-27 MED ORDER — FUROSEMIDE 20 MG PO TABS
20.0000 mg | ORAL_TABLET | Freq: Every day | ORAL | Status: DC | PRN
  Filled 2013-10-27: qty 1

## 2013-10-27 NOTE — Progress Notes (Signed)
ANTICOAGULATION CONSULT NOTE - Follow up [facility] for Warfarin Indication: h/o PE  No Known Allergies  Patient Measurements:    Vital Signs: BP: 148/86 mmHg (02/27 1700) Pulse Rate: 82 (02/27 1630)  Labs:  Recent Labs  10/27/13 1641  HGB 13.5  HCT 41.0  PLT 265  LABPROT 21.3*  INR 1.91*  CREATININE 1.26*    The CrCl is unknown because both a height and weight (above a minimum accepted value) are required for this calculation.   Assessment: 11 yoF admitted 2/27 for medical clearance in anticipation of transfer to psych facility.  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  Pharmacy consulted to manage warfarin.  Warfarin home dose = 7.5 mg daily except 5 mg on Tues/Thursday, last dose reported 10/26/13  INR 1.91 on admission.  CBC:  13.5, Plt 265  Goal of Therapy:  INR 2-3 Monitor platelets by anticoagulation protocol: Yes   Plan:   Warfarin 7.5 mg po once tonight at 2200  Daily PT/INR.  Gretta Arab PharmD, BCPS Pager [phone] 10/27/2013 9:11 PM

## 2013-10-27 NOTE — BH Assessment (Signed)
Clinician contacted Dr. Darl Householder to gather report prior to assessment. He reported pt was brought to ED by family for being loud and aggressive in community and cursing in a bank and having flight of ideas. Dr. Darl Householder reported pt was here about a week ago due to similar behaviors and pt was assessed and recommended inpatient but family refused. Pt's family request pt be re-evaluated for inpatient admission at this time due to ongoing behaviors. Dr. Darl Householder reported pt was calm now because she had received Geodon and Benadryl. Clinician contacted Topher, RN working with pt who reported pt was not alert due to medications. Topher agreed to contact TTS when pt was alert and available for assessment.   Fredonia Highland, MSW, LCSW Triage Specialist [phone]

## 2013-10-27 NOTE — ED Notes (Signed)
Pt aggressive. Pt having flight of ideas. Pt states "Michaela Horton the most beautiful name..the president of the [place]." Pt cursing and loud with staff.

## 2013-10-27 NOTE — ED Notes (Signed)
Pt has been sleeping for about 15 minuets.

## 2013-10-27 NOTE — ED Notes (Signed)
150 palpated BP, 80 HR, 20 RR en route with EMS.

## 2013-10-27 NOTE — ED Notes (Signed)
Pt resting quietly at this time. Daughter at bedside. Pt has, even unlabored resp

## 2013-10-27 NOTE — ED Notes (Signed)
Patient resting.  Daughter at bedside.  Informed daughter to let staff know when she leaves. Placed O2 on patient
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
## 2013-10-27 NOTE — ED Notes (Signed)
respiratory called for ABG

## 2013-10-27 NOTE — ED Notes (Signed)
Bed: DU43 Expected date:  Expected time:  Means of arrival:  Comments: psych eval
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
## 2013-10-27 NOTE — ED Notes (Signed)
Per EMS daughter called to have pt send for psych eval. Per EMS pt a/o x4. Pt loud and aggressive at present time.

## 2013-10-28 LAB — URINALYSIS, ROUTINE W REFLEX MICROSCOPIC
Bilirubin Urine: NEGATIVE
Glucose, UA: NEGATIVE mg/dL
HGB URINE DIPSTICK: NEGATIVE
Ketones, ur: NEGATIVE mg/dL
NITRITE: NEGATIVE
PROTEIN: NEGATIVE mg/dL
SPECIFIC GRAVITY, URINE: 1.013 (ref 1.005–1.030)
UROBILINOGEN UA: 1 mg/dL (ref 0.0–1.0)
pH: 7 (ref 5.0–8.0)

## 2013-10-28 LAB — URINE MICROSCOPIC-ADD ON

## 2013-10-28 LAB — RAPID URINE DRUG SCREEN, HOSP PERFORMED
AMPHETAMINES: NOT DETECTED
BARBITURATES: NOT DETECTED
BENZODIAZEPINES: NOT DETECTED
COCAINE: NOT DETECTED
OPIATES: NOT DETECTED
Tetrahydrocannabinol: NOT DETECTED

## 2013-10-28 LAB — PROTIME-INR
INR: 1.76 — ABNORMAL HIGH (ref 0.00–1.49)
PROTHROMBIN TIME: 20 s — AB (ref 11.6–15.2)

## 2013-10-28 MED ORDER — WARFARIN SODIUM 7.5 MG PO TABS
7.5000 mg | ORAL_TABLET | Freq: Once | ORAL | Status: DC
  Filled 2013-10-28: qty 1

## 2013-10-28 NOTE — ED Notes (Signed)
Pts daughter has arrived to visit. Visiting policy explained (no one made daughter aware of policy) daughter allowed to go visit and sit with pt due. She has been made aware we are looking to try and place pt at [facility]. Daughter states pt was discharged last week with family rather than be placed, they realized pt needs meds regulated/adjusted and the need for her to be placed this time. At this time pt is calm.

## 2013-10-28 NOTE — ED Notes (Signed)
Pt voided urine on bed

## 2013-10-28 NOTE — BH Assessment (Signed)
Received call from [facility]. They cannot consider Pt for admission until a completed ECG is faxed to their facility at [phone].  Received call from [facility]. They cannot consider Pt without Findings and Custody Order faxed to their facility at [phone].  This information will be passed to oncoming TTS shift.   Orpah Greek Rosana Hoes, [facility] Triage Specialist

## 2013-10-28 NOTE — ED Notes (Addendum)
Pt took off soft restraints and ambulated self in hall. Pt reoriented to room. Pt loud with staff and daughter. Unable to use therapeutic conversation to calm pt.

## 2013-10-28 NOTE — ED Notes (Addendum)
Pt took off oxygen. Pt 80 on RA. Pt placed on oxygen 4 lpm [place]. Pt O2 sat on 4 lpm [place] 93%.

## 2013-10-28 NOTE — Progress Notes (Addendum)
Received IVC paper work from [employer] on pt, paper work has been faxed to [employer] at [phone].    EKG has also been faxed to [facility] at [phone].  1034 Placed call to [facility] regarding bed availability and spoke with Romie Minus, stated they do have beds available and requested referral be faxed.  Referral faxed for review.  [place] call to [facility], per [place] currently at capacity but can fax in case of d/c's.  [place] from [facility] stated they had gero beds available, referral faxed for review.   Rick Duff Disposition MHT

## 2013-10-28 NOTE — ED Notes (Signed)
Pt refused 1000 medications per Dillon Bjork at scheduled time. Pt agrees to take daily medication at present time. Medication given. Pharmacologist and daughter at bedside. Pt calm.

## 2013-10-28 NOTE — BH Assessment (Signed)
[facility] to follow up on Pt's referral status. Per Threasa Beards, Pt has been declined due to her medical acuity.  Contacted [facility]. They have received referral information but their unit is at capacity.  Orpah Greek Rosana Hoes, [facility] Triage Specialist

## 2013-10-28 NOTE — BH Assessment (Signed)
[facility] to follow up on Pt's referral status. Per Renee, Pt has been declined due to her acuity.  Orpah Greek Rosana Hoes, [facility] Triage Specialist

## 2013-10-28 NOTE — ED Notes (Signed)
Pt full linen and scrubs changed. Pt voided on self.

## 2013-10-28 NOTE — Progress Notes (Addendum)
ANTICOAGULATION CONSULT NOTE - Follow up [place] for Warfarin Indication: h/o PE  No Known Allergies  Patient Measurements:    Vital Signs: Temp: 98.1 F (36.7 C) (02/28 0833) Temp src: Oral (02/28 0833) BP: 145/83 mmHg (02/28 0833) Pulse Rate: 70 (02/28 0510)  Labs:  Recent Labs  10/27/13 1641 10/28/13 0530  HGB 13.5  --   HCT 41.0  --   PLT 265  --   LABPROT 21.3* 20.0*  INR 1.91* 1.76*  CREATININE 1.26*  --     The CrCl is unknown because both a height and weight (above a minimum accepted value) are required for this calculation.   Assessment: 2 yoF admitted 2/27 for medical clearance in anticipation of transfer to psych facility.  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  Pharmacy consulted to manage warfarin.  Warfarin home dose = 7.5 mg daily except 5 mg on Tues/Thursday, last dose reported 10/26/13  Today's INR = 1.76, trending down as patient refused dose last night  CBC:  2/27 - Hgb 13.5, Plt 265  Ibuprofen ordered PRN fever (no doses given)  Goal of Therapy:  INR 2-3 Monitor platelets by anticoagulation protocol: Yes   Plan:   Warfarin 7.5 mg po once tonight (patient refused dose last night)  Daily PT/INR.  Consider d/c ibuprofen PRN fever to avoid possibility of administering (on APAP for same)  Doreene Eland, PharmD, BCPS.   Pager: [phone] 10/28/2013 11:37 AM

## 2013-10-28 NOTE — ED Notes (Signed)
Pt sleeping very soundly.Pt attempted to be awoken to give night time medications but patient would mumble but to open her eyes despite bodily shaking and loud speech. Nighttime medications held until patient is more responsive.

## 2013-10-28 NOTE — Consult Note (Signed)
Diagnosis: Schizophrenia   Attempted assessment of this patient with Dr Louretta Shorten.  Patient was confused and was oriented to her name only.  Patient was able to name the president and could not answer any other question correctly.  Patient had no family member in the room with her.  Per previous documentation, patient has been aggressive and has not been compliant with her Psychiatric medications.   Patient was calm and cooperative during the interview although she could not answer our questions correctly.  We will continue to seek placement at any Geriatric/ Psych facility for stabilization.  Documents have been faxed to various facilities.    We will continue to monitor patient.  Charmaine Downs   PMHNP-BC  Patient was seen face to face for psychiatric evaluation, case discussed with physician extender and reviewed the information documented and agree with the treatment plan.  Tyrese Capriotti,JANARDHAHA R. 10/28/2013 8:38 PM

## 2013-10-28 NOTE — Progress Notes (Signed)
23 Pt has been declined at [facility] d/t acuity being too high for facilities pt population per Romie Minus.   Rick Duff Disposition MHT

## 2013-10-28 NOTE — Progress Notes (Addendum)
1222 received phone call from [place] at [facility], pt has been declined by MD d/t being "too acute at this time for their unit."   [facility] Disposition MHT

## 2013-10-28 NOTE — ED Notes (Signed)
She remains awake and alert in the presence of her daughter.  Restraints removed, and sitter obtained for her. Her daughter assists her eating breakfast.

## 2013-10-28 NOTE — Progress Notes (Signed)
Spoke with pts nurse regarding faxing findings and custody to [facility] and fax number was given [phone].  Information was also given regarding [facility] requesting EKG once complete, fax number also given [phone].  Also informed nurse that if she had any problems or question could fax to TTS at [phone] and would help.   Rick Duff Disposition MHT

## 2013-10-28 NOTE — ED Notes (Signed)
She is awake, calm and cooperative.  She does remains confused, but is able to follow commands at times.  Her daughters are with her. Sitter remains with her at all times.

## 2013-10-28 NOTE — BH Assessment (Signed)
Serena Colonel, NP recommended Pt be referred to a geriatric-psych facility. Contacted the following geriatric-psych facilities:  [facility]: At [facility]: At [facility]: At capacity  [facility]: Bed available. Faxed clinical information. [facility]: Bed available. Faxed clinical information. [facility]: Bed available. Faxed clinical information.   Orpah Greek Rosana Hoes, [facility] Triage Specialist

## 2013-10-29 ENCOUNTER — Encounter (HOSPITAL_COMMUNITY): Payer: Self-pay | Admitting: Registered Nurse

## 2013-10-29 DIAGNOSIS — F4325 Adjustment disorder with mixed disturbance of emotions and conduct: Secondary | ICD-10-CM | POA: Diagnosis present

## 2013-10-29 DIAGNOSIS — R4689 Other symptoms and signs involving appearance and behavior: Secondary | ICD-10-CM | POA: Diagnosis present

## 2013-10-29 LAB — PROTIME-INR
INR: 1.27 (ref 0.00–1.49)
Prothrombin Time: 15.6 seconds — ABNORMAL HIGH (ref 11.6–15.2)

## 2013-10-29 MED ORDER — STERILE WATER FOR INJECTION IJ SOLN
INTRAMUSCULAR | Status: AC
  Administered 2013-10-29: 06:00:00
  Filled 2013-10-29: qty 10

## 2013-10-29 MED ORDER — WARFARIN SODIUM 7.5 MG PO TABS
7.5000 mg | ORAL_TABLET | Freq: Once | ORAL | Status: AC
  Administered 2013-10-29: 7.5 mg via ORAL
  Filled 2013-10-29: qty 1

## 2013-10-29 NOTE — ED Provider Notes (Signed)
ECG interpretation   Date: 10/29/2013  Rate: 91  Rhythm: normal sinus rhythm  QRS Axis: normal  Intervals: normal  ST/T Wave abnormalities: normal  Conduction Disutrbances: none  Narrative Interpretation:   Old EKG Reviewed: No significant changes noted     Hoy Morn, MD 10/29/13 [phone]

## 2013-10-29 NOTE — ED Notes (Signed)
Pt stripping clothes off and trying to walk down hallway. Pt advised to stay in bed.

## 2013-10-29 NOTE — ED Notes (Signed)
Pt removed from the two arm restraints present.

## 2013-10-29 NOTE — Progress Notes (Signed)
MHT spoke with Garnet Sierras, RN at [facility].  MHT reported that attending MD confirmed pt d/c off of [place] on 10/28/13 and using 2L at hospital and doing fine.  RN requesting a MD note of this information be faxed for medical review to add pt to waitlist at [facility].  Marene Lenz

## 2013-10-29 NOTE — ED Notes (Signed)
Pt in arm restraints. Tried to take pt out of arm restraints. When asked if she was going hurt herself or others she states "If my arms are free I will!".

## 2013-10-29 NOTE — ED Notes (Signed)
Daughter at bedside and was asked to step out to waiting room due to increased patient agitation upon her arrival.

## 2013-10-29 NOTE — ED Notes (Signed)
Pt awoke and began to sob and cry uncontrollably. Pt had flight of ideas,talking about "how I only wanted to beloved!"  Pt was un consolable. Oxygen saturation fell and a nasal canula was put back on the .  Pt fought with staff to remove the canula.Pt was given geodon but the effect took a half an hour in which pt had to be restrained by her hands.  Pt eventually calmed down and stated that she was hungry.

## 2013-10-29 NOTE — ED Notes (Signed)
Psychiatry Dr.J and Psychiatry NP at bedside.

## 2013-10-29 NOTE — ED Notes (Signed)
Pt reports that her daughter gives her anxiety when she is around and is feeling much better speaking with her sitter. She states "It's gets stuff off my chest".

## 2013-10-29 NOTE — ED Notes (Signed)
Son [name] called he would like an update for psych team evaluating patient about her placement. Home # [phone] and cell # [phone]

## 2013-10-29 NOTE — ED Notes (Signed)
A recliner chair provided for the patient for comfort. Pt watching television but loudly talking.

## 2013-10-29 NOTE — Progress Notes (Signed)
1962 Per Vaughan Basta, RN at [facility] pt's H&P had not been faxed and was needed before pt could be reviewed for their wait list.  H&P has been faxed.   Rick Duff Disposition MHT

## 2013-10-29 NOTE — ED Notes (Signed)
Pt needed to use the bathroom. Pt got up out of bed on her own and before staff could get into the room pt had sat on the trash can and was urinating.  Pt refused to getup stating " I need to finish."Pt allowed to finish and was taken to the rest room to clean up and wash her hands. Nursing attempted to give the pt her medication and get the patient to eat bu tpt adamantly refused to do wither after initially indicating that she would. A second nurse, a female, attempt to give meds without success. The charge Nurse attempted to give medications as well and had no success as well.  Pt has since returned to sleeping.

## 2013-10-29 NOTE — ED Provider Notes (Signed)
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   That is why she is on chronic oxygen and that is why she was on a 5 day course of prednisone.  She is no longer on the prednisone now.    [name], MD 10/29/13 2219

## 2013-10-29 NOTE — Consult Note (Signed)
[facility] Consult   Reason for Consult:  Aggressive behavior Referring Physician:  EDP  Michaela Horton is an 73 y.o. female. Total Time spent with patient: 45 minutes  Assessment: AXIS I:  Adjustment Disorder with Mixed Disturbance of Emotions and Conduct, Schizoaffective Disorder and Aggressive Behavior AXIS II:  Deferred AXIS III:   Past Medical History  Diagnosis Date  . Dyspnea   . Pulmonary embolism   . Breast cyst     right  . Hypothyroidism   . Renal cyst   . Leukocytopenia   . Monoclonal gammopathy   . Osteoarthritis   . Schizoaffective disorder   . Wears dentures   . Pulmonary hypertension   . Hypertension     pulmonary   AXIS IV:  other psychosocial or environmental problems and problems with primary support group AXIS V:  41-50 serious symptoms  Plan:  Recommend psychiatric Inpatient admission when medically cleared.  Subjective:   Michaela Horton is a 73 y.o. female patient admitted with Adjustment Disorder with Mixed Disturbance of Emotions and Conduct, Schizoaffective Disorder and Aggressive Behavior.  HPI:  Patient seeing Dr. Louretta Shorten walking in room states "I've met you before.  I'm ready to go home; I'm here cause I thought I couldn't function, going back and forth to the hospital but I feel good and I'm ready to go.  I was just tired of everybody telling me what my thoughts should be. I slept; I woke up feeling good; I've been talking all day and I'm ready to leave.  I am not going to kill my self; I go when the good [name] gets ready for me to go.  I get so agitated and have to take a Lorazepam.  I try to sit down and them mother fuckers worry the shit out of me. When I get frustrated I get angry, cuss, fuss, spit.  No I don't hit; No fighting; I get frustrated from them trying to walk all over me; I'm grown; My family will come up with excused for why I can't leave."   Patient appeared alert and oriented today and corporative during interview.    HPI Elements:   Location:  Confusion and Aggressive behavior. Quality:  aggressive behavior. Severity:  aggressive behavior. Timing:  several days.   Review of Systems  Constitutional: Negative for malaise/fatigue.  Musculoskeletal: Negative.   Psychiatric/Behavioral: Negative for depression, suicidal ideas, hallucinations and substance abuse. The patient is not nervous/anxious.     Past Psychiatric History: Past Medical History  Diagnosis Date  . Dyspnea   . Pulmonary embolism   . Breast cyst     right  . Hypothyroidism   . Renal cyst   . Leukocytopenia   . Monoclonal gammopathy   . Osteoarthritis   . Schizoaffective disorder   . Wears dentures   . Pulmonary hypertension   . Hypertension     pulmonary    reports that she has never smoked. She has never used smokeless tobacco. She reports that she does not drink alcohol or use illicit drugs. Family History  Problem Relation Age of Onset  . Cancer Father     PROSTATE   Family History Substance Abuse: No Family Supports: Yes, List: (Daughter Marcy Panning [phone]) Living Arrangements: Alone Can pt return to current living arrangement?: Yes   Allergies:  No Known Allergies  ACT Assessment Complete:  Yes:    Educational Status    Risk to Self: Risk to self Suicidal Ideation: No Suicidal Intent: No Is  patient at risk for suicide?: No Suicidal Plan?: No Access to Means: No What has been your use of drugs/alcohol within the last 12 months?: None Reported Previous Attempts/Gestures: No How many times?: 0 Other Self Harm Risks: Not taking her oxygen and medication Triggers for Past Attempts:  (NA) Intentional Self Injurious Behavior: None Family Suicide History: No Recent stressful life event(s):  (NA) Persecutory voices/beliefs?: No Depression: Yes Depression Symptoms: Insomnia;Fatigue;Feeling worthless/self pity Substance abuse history and/or treatment for substance abuse?: No Suicide prevention  information given to non-admitted patients: Not applicable  Risk to Others: Risk to Others Homicidal Ideation: No Thoughts of Harm to Others: No Current Homicidal Intent: No Current Homicidal Plan: No Access to Homicidal Means: No Identified Victim: None Reported History of harm to others?: No Assessment of Violence: None Noted Violent Behavior Description: NA Does patient have access to weapons?: No Criminal Charges Pending?: No Does patient have a court date: No  Abuse:    Prior Inpatient Therapy: Prior Inpatient Therapy Prior Inpatient Therapy: Yes Prior Therapy Dates: 2004 Prior Therapy Facilty/Provider(s): [facility] Reason for Treatment: Psychosis  Prior Outpatient Therapy: Prior Outpatient Therapy Prior Outpatient Therapy: Yes Prior Therapy Dates: Ongoing  Prior Therapy Facilty/Provider(s): [place] [place], [place] Reason for Treatment: Medication Management   Additional Information: Additional Information 1:1 In Past 12 Months?: No CIRT Risk: No Elopement Risk: No Does patient have medical clearance?: Yes   Objective: Blood pressure 117/73, pulse 71, temperature 98.2 F (36.8 C), temperature source Axillary, resp. rate 18, SpO2 93.00%.There is no weight on file to calculate BMI. Results for orders placed during the hospital encounter of 10/27/13 (from the past 72 hour(s))  CBC WITH DIFFERENTIAL     Status: None   Collection Time    10/27/13  4:41 PM      Result Value Ref Range   WBC 4.8  4.0 - 10.5 K/uL   RBC 4.40  3.87 - 5.11 MIL/uL   Hemoglobin 13.5  12.0 - 15.0 g/dL   HCT 41.0  36.0 - 46.0 %   MCV 93.2  78.0 - 100.0 fL   MCH 30.7  26.0 - 34.0 pg   MCHC 32.9  30.0 - 36.0 g/dL   RDW 14.6  11.5 - 15.5 %   Platelets 265  150 - 400 K/uL   Neutrophils Relative % 49  43 - 77 %   Neutro Abs 2.3  1.7 - 7.7 K/uL   Lymphocytes Relative 42  12 - 46 %   Lymphs Abs 2.0  0.7 - 4.0 K/uL   Monocytes Relative 8  3 - 12 %   Monocytes Absolute 0.4  0.1 - 1.0 K/uL   Eosinophils  Relative 1  0 - 5 %   Eosinophils Absolute 0.0  0.0 - 0.7 K/uL   Basophils Relative 0  0 - 1 %   Basophils Absolute 0.0  0.0 - 0.1 K/uL  COMPREHENSIVE METABOLIC PANEL     Status: Abnormal   Collection Time    10/27/13  4:41 PM      Result Value Ref Range   Sodium 134 (*) 137 - 147 mEq/L   Potassium 4.3  3.7 - 5.3 mEq/L   Comment: SLIGHT HEMOLYSIS     HEMOLYSIS AT THIS LEVEL MAY AFFECT RESULT   Chloride 97  96 - 112 mEq/L   CO2 24  19 - 32 mEq/L   Glucose, Bld 90  70 - 99 mg/dL   BUN 23  6 - 23 mg/dL   Creatinine,  Ser 1.26 (*) 0.50 - 1.10 mg/dL   Calcium 9.6  8.4 - 10.5 mg/dL   Total Protein 8.9 (*) 6.0 - 8.3 g/dL   Albumin 4.0  3.5 - 5.2 g/dL   AST 37  0 - 37 U/L   Comment: SLIGHT HEMOLYSIS     HEMOLYSIS AT THIS LEVEL MAY AFFECT RESULT   ALT 23  0 - 35 U/L   Comment: SLIGHT HEMOLYSIS     HEMOLYSIS AT THIS LEVEL MAY AFFECT RESULT   Alkaline Phosphatase 44  39 - 117 U/L   Total Bilirubin 0.7  0.3 - 1.2 mg/dL   GFR calc non Af Amer 42 (*) >90 mL/min   GFR calc Af Amer 48 (*) >90 mL/min   Comment: (NOTE)     The eGFR has been calculated using the CKD EPI equation.     This calculation has not been validated in all clinical situations.     eGFR's persistently <90 mL/min signify possible Chronic Kidney     Disease.  ETHANOL     Status: None   Collection Time    10/27/13  4:41 PM      Result Value Ref Range   Alcohol, Ethyl (B) <11  0 - 11 mg/dL   Comment:            LOWEST DETECTABLE LIMIT FOR     SERUM ALCOHOL IS 11 mg/dL     FOR MEDICAL PURPOSES ONLY  SALICYLATE LEVEL     Status: Abnormal   Collection Time    10/27/13  4:41 PM      Result Value Ref Range   Salicylate Lvl <5.3 (*) 2.8 - 20.0 mg/dL  ACETAMINOPHEN LEVEL     Status: None   Collection Time    10/27/13  4:41 PM      Result Value Ref Range   Acetaminophen (Tylenol), Serum <15.0  10 - 30 ug/mL   Comment:            THERAPEUTIC CONCENTRATIONS VARY     SIGNIFICANTLY. A RANGE OF 10-30     ug/mL MAY BE AN  EFFECTIVE     CONCENTRATION FOR MANY PATIENTS.     HOWEVER, SOME ARE BEST TREATED     AT CONCENTRATIONS OUTSIDE THIS     RANGE.     ACETAMINOPHEN CONCENTRATIONS     >150 ug/mL AT 4 HOURS AFTER     INGESTION AND >50 ug/mL AT 12     HOURS AFTER INGESTION ARE     OFTEN ASSOCIATED WITH TOXIC     REACTIONS.  PROTIME-INR     Status: Abnormal   Collection Time    10/27/13  4:41 PM      Result Value Ref Range   Prothrombin Time 21.3 (*) 11.6 - 15.2 seconds   INR 1.91 (*) 0.00 - 1.49  PRO B NATRIURETIC PEPTIDE     Status: Abnormal   Collection Time    10/27/13  4:41 PM      Result Value Ref Range   Pro B Natriuretic peptide (BNP) 848.8 (*) 0 - 125 pg/mL  BLOOD GAS, ARTERIAL     Status: Abnormal   Collection Time    10/27/13  7:30 PM      Result Value Ref Range   O2 Content 5.0     Delivery systems NASAL CANNULA     pH, Arterial 7.396  7.350 - 7.450   pCO2 arterial 42.8  35.0 - 45.0 mmHg   pO2, Arterial 56.5 (*)  80.0 - 100.0 mmHg   Bicarbonate 25.8 (*) 20.0 - 24.0 mEq/L   TCO2 23.0  0 - 100 mmol/L   Acid-Base Excess 1.2  0.0 - 2.0 mmol/L   O2 Saturation 87.9     Patient temperature 98.6     Collection site LEFT RADIAL     Drawn by [id number]     Sample type ARTERIAL DRAW     Allens test (pass/fail) PASS  PASS  PROTIME-INR     Status: Abnormal   Collection Time    10/28/13  5:30 AM      Result Value Ref Range   Prothrombin Time 20.0 (*) 11.6 - 15.2 seconds   INR 1.76 (*) 0.00 - 1.49  URINE RAPID DRUG SCREEN (HOSP PERFORMED)     Status: None   Collection Time    10/28/13  9:44 AM      Result Value Ref Range   Opiates NONE DETECTED  NONE DETECTED   Cocaine NONE DETECTED  NONE DETECTED   Benzodiazepines NONE DETECTED  NONE DETECTED   Amphetamines NONE DETECTED  NONE DETECTED   Tetrahydrocannabinol NONE DETECTED  NONE DETECTED   Barbiturates NONE DETECTED  NONE DETECTED   Comment:            DRUG SCREEN FOR MEDICAL PURPOSES     ONLY.  IF CONFIRMATION IS NEEDED     FOR ANY  PURPOSE, NOTIFY LAB     WITHIN 5 DAYS.                LOWEST DETECTABLE LIMITS     FOR URINE DRUG SCREEN     Drug Class       Cutoff (ng/mL)     Amphetamine      1000     Barbiturate      200     Benzodiazepine   814     Tricyclics       481     Opiates          300     Cocaine          300     THC              50  URINALYSIS, ROUTINE W REFLEX MICROSCOPIC     Status: Abnormal   Collection Time    10/28/13 10:10 AM      Result Value Ref Range   Color, Urine YELLOW  YELLOW   APPearance CLEAR  CLEAR   Specific Gravity, Urine 1.013  1.005 - 1.030   pH 7.0  5.0 - 8.0   Glucose, UA NEGATIVE  NEGATIVE mg/dL   Hgb urine dipstick NEGATIVE  NEGATIVE   Bilirubin Urine NEGATIVE  NEGATIVE   Ketones, ur NEGATIVE  NEGATIVE mg/dL   Protein, ur NEGATIVE  NEGATIVE mg/dL   Urobilinogen, UA 1.0  0.0 - 1.0 mg/dL   Nitrite NEGATIVE  NEGATIVE   Leukocytes, UA SMALL (*) NEGATIVE  URINE MICROSCOPIC-ADD ON     Status: None   Collection Time    10/28/13 10:10 AM      Result Value Ref Range   Squamous Epithelial / LPF RARE  RARE   WBC, UA 0-2  <3 WBC/hpf  PROTIME-INR     Status: Abnormal   Collection Time    10/29/13  4:50 AM      Result Value Ref Range   Prothrombin Time 15.6 (*) 11.6 - 15.2 seconds   INR 1.27  0.00 - 1.49   Labs  are reviewed and are pertinent for assessment of ETOH, illicit drug use and other medical issues.  Medications reviewed no additions of modifications made  Current Facility-Administered Medications  Medication Dose Route Frequency Provider Last Rate Last Dose  . acetaminophen (TYLENOL) tablet 650 mg  650 mg Oral Q4H PRN Wandra Arthurs, MD      . B-complex with vitamin C tablet 1 tablet  1 tablet Oral Daily Wandra Arthurs, MD   1 tablet at 10/29/13 [phone]  . calcium-vitamin D (OSCAL WITH D) 500-200 MG-UNIT per tablet 1 tablet  1 tablet Oral BID Wandra Arthurs, MD   1 tablet at 10/29/13 9201  . furosemide (LASIX) tablet 20 mg  20 mg Oral Daily PRN Wandra Arthurs, MD      . ibuprofen  (ADVIL,MOTRIN) tablet 600 mg  600 mg Oral Q8H PRN Wandra Arthurs, MD      . lamoTRIgine (LAMICTAL) tablet 100 mg  100 mg Oral QHS Wandra Arthurs, MD      . levothyroxine (SYNTHROID, LEVOTHROID) tablet 112 mcg  112 mcg Oral QAC breakfast Wandra Arthurs, MD      . LORazepam (ATIVAN) injection 1 mg  1 mg Intravenous Once Wandra Arthurs, MD      . LORazepam (ATIVAN) tablet 1 mg  1 mg Oral Q8H PRN Wandra Arthurs, MD      . Macitentan TABS 10 mg  10 mg Oral Daily Wandra Arthurs, MD      . multivitamin with minerals tablet 1 tablet  1 tablet Oral q morning - 10a Wandra Arthurs, MD      . OLANZapine [facility]) tablet 7.5 mg  7.5 mg Oral QHS Wandra Arthurs, MD   7.5 mg at 10/29/13 1003  . Riociguat TABS 2.5 mg  2.5 mg Oral TID Wandra Arthurs, MD      . spironolactone (ALDACTONE) tablet 50 mg  50 mg Oral Daily Wandra Arthurs, MD   50 mg at 10/29/13 0936  . temazepam (RESTORIL) capsule 15 mg  15 mg Oral QHS Wandra Arthurs, MD   15 mg at 10/29/13 1004  . vitamin C (ASCORBIC ACID) tablet 500 mg  500 mg Oral Daily Wandra Arthurs, MD   500 mg at 10/29/13 0071  . warfarin (COUMADIN) tablet 7.5 mg  7.5 mg Oral ONCE-1800 [place]is Riley, [facility]      . Warfarin - Pharmacist Dosing Inpatient   Does not apply q1800 Wandra Arthurs, MD      . ziprasidone (GEODON) injection 10 mg  10 mg Intramuscular Q6H PRN Wandra Arthurs, MD   10 mg at 10/29/13 2197   Current Outpatient Prescriptions  Medication Sig Dispense Refill  . ADEMPAS 2.5 MG TABS Take 2.5 mg by mouth 3 (three) times daily.       . B Complex-C (B-COMPLEX WITH VITAMIN C) tablet Take 1 tablet by mouth daily.       . calcium-vitamin D (OSCAL WITH D) 500-200 MG-UNIT per tablet Take 1 tablet by mouth 2 (two) times daily.       . cycloSPORINE (RESTASIS) 0.05 % ophthalmic emulsion Place 1 drop into both eyes every 12 (twelve) hours.       . furosemide (LASIX) 20 MG tablet Take 20 mg by mouth daily as needed for fluid.       Marland Kitchen lamoTRIgine (LAMICTAL) 100 MG tablet Take 100 mg by mouth at bedtime.       Marland Kitchen  levothyroxine (SYNTHROID, LEVOTHROID) 112 MCG tablet Take 112 mcg by mouth daily before breakfast.       . LORazepam (ATIVAN) 1 MG tablet Take 1 tablet by mouth 2 (two) times daily as needed for anxiety or sleep.       . Macitentan (OPSUMIT) 10 MG TABS Take 10 mg by mouth daily.       . Multiple Vitamin (MULTIVITAMIN WITH MINERALS) TABS tablet Take 1 tablet by mouth every morning.      Marland Kitchen OLANZapine (ZYPREXA) 7.5 MG tablet Take 7.5 mg by mouth at bedtime.      . predniSONE (DELTASONE) 20 MG tablet Take 2 tablets by mouth every morning.      Marland Kitchen spironolactone (ALDACTONE) 50 MG tablet Take 50 mg by mouth daily.       . temazepam (RESTORIL) 15 MG capsule Take 15 mg by mouth at bedtime.       . vitamin C (ASCORBIC ACID) 500 MG tablet Take 500 mg by mouth daily.       Marland Kitchen warfarin (COUMADIN) 5 MG tablet Take 5-7.5 mg by mouth daily at 6 PM. 5 mg Tuesday and Thursday, 7.5 mg Monday, Wednesday, Friday, Saturday, and Sunday        Psychiatric Specialty Exam:     Blood pressure 117/73, pulse 71, temperature 98.2 F (36.8 C), temperature source Axillary, resp. rate 18, SpO2 93.00%.There is no weight on file to calculate BMI.  General Appearance: Casual  Eye Contact::  Good  Speech:  Clear and Coherent and Normal Rate  Volume:  Normal  Mood:  Anxious and Irritable  Affect:  Congruent  Thought Process:  Circumstantial  Orientation:  Other:  Person and place  Thought Content:  Rumination  Suicidal Thoughts:  No  Homicidal Thoughts:  No  Memory:  Immediate;   Fair Recent;   Fair  Judgement:  Impaired  Insight:  Fair  Psychomotor Activity:  Normal  Concentration:  Poor  Recall:  [employer] of Knowledge:Good  Language: Good  Akathisia:  No  Handed:  Right  AIMS (if indicated):     Assets:  Communication Skills Desire for Improvement Housing Social Support  Sleep:      Musculoskeletal: Strength & Muscle Tone: within normal limits Gait & Station: normal Patient leans: N/A  Treatment Plan  Summary: Daily contact with patient to assess and evaluate symptoms and progress in treatment Medication management  Disposition:  Inpatient treatment recommended.  Will continue to look for inpatient treatment.  Patient has also been placed on [facility] wait list.  Earleen Newport, FNP-BC 10/29/2013 11:53 AM  Patient was seen face to face for psychiatric evaluation with the physician extender. This case was also discussed with her patient physician son who is concerned about patient current mental status and possible placement needs. Case discussed with the physician extender and formulated treatment plan. Reviewed the information documented and agree with the treatment plan.  ,JANARDHAHA R. 10/29/2013 4:52 PM

## 2013-10-29 NOTE — Progress Notes (Signed)
ANTICOAGULATION CONSULT NOTE - Follow up [place] for Warfarin Indication: h/o PE  No Known Allergies  Patient Measurements:    Vital Signs: Temp: 97.8 F (36.6 C) (03/01 1201) Temp src: Oral (03/01 1201) BP: 109/64 mmHg (03/01 1201) Pulse Rate: 72 (03/01 1201)  Labs:  Recent Labs  10/27/13 1641 10/28/13 0530 10/29/13 0450  HGB 13.5  --   --   HCT 41.0  --   --   PLT 265  --   --   LABPROT 21.3* 20.0* 15.6*  INR 1.91* 1.76* 1.27  CREATININE 1.26*  --   --     The CrCl is unknown because both a height and weight (above a minimum accepted value) are required for this calculation.   Assessment: 23 yoF admitted 2/27 for medical clearance in anticipation of transfer to psych facility.  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  Pharmacy consulted to manage warfarin.  Warfarin home dose = 7.5 mg daily except 5 mg on Tues/Thursday, last dose reported 10/26/13  Today's INR = 1.27, trending down as patient refused doses last two nights  CBC:  2/27 - Hgb 13.5, Plt 265  Ibuprofen ordered PRN fever (no doses given)  Goal of Therapy:  INR 2-3 Monitor platelets by anticoagulation protocol: Yes   Plan:   Warfarin 7.5 mg po once tonight (patient refused dose last two nights)  Daily PT/INR.  Consider bridge therapy if indicated with LMWH (appears PE was in 2011)  Consider d/c ibuprofen PRN fever to avoid possibility of administering (on APAP for same)  Doreene Eland, PharmD, BCPS.   Pager: [phone] 10/29/2013 12:13 PM

## 2013-10-30 ENCOUNTER — Ambulatory Visit: Payer: Medicare Other | Admitting: Neurology

## 2013-10-30 LAB — PROTIME-INR
INR: 1.32 (ref 0.00–1.49)
PROTHROMBIN TIME: 16.1 s — AB (ref 11.6–15.2)

## 2013-10-30 MED ORDER — SULFAMETHOXAZOLE-TMP DS 800-160 MG PO TABS
1.0000 | ORAL_TABLET | Freq: Two times a day (BID) | ORAL | Status: DC
  Administered 2013-10-30: 1 via ORAL
  Filled 2013-10-30: qty 1

## 2013-10-30 MED ORDER — STERILE WATER FOR INJECTION IJ SOLN
INTRAMUSCULAR | Status: AC
  Filled 2013-10-30: qty 10

## 2013-10-30 MED ORDER — WARFARIN SODIUM 5 MG PO TABS
5.0000 mg | ORAL_TABLET | ORAL | Status: DC
  Filled 2013-10-30: qty 1

## 2013-10-30 NOTE — ED Notes (Signed)
Charge RN spoke with [facility], pt on waitlist for [facility], pt IVC

## 2013-10-30 NOTE — ED Notes (Signed)
Patient ate 25% of her lunch.

## 2013-10-30 NOTE — Consult Note (Signed)
[facility]iatry Consult   Reason for Consult:  Aggressive behavior Referring Physician:  EDP  Michaela Horton is an 73 y.o. female. Total Time spent with patient: 12min  Assessment: AXIS I:  Adjustment Disorder with Mixed Disturbance of Emotions and Conduct, Schizoaffective Disorder and Aggressive Behavior AXIS II:  Deferred AXIS III:   Past Medical History  Diagnosis Date  . Dyspnea   . Pulmonary embolism   . Breast cyst     right  . Hypothyroidism   . Renal cyst   . Leukocytopenia   . Monoclonal gammopathy   . Osteoarthritis   . Schizoaffective disorder   . Wears dentures   . Pulmonary hypertension   . Hypertension     pulmonary   AXIS IV:  other psychosocial or environmental problems and problems with primary support group AXIS V:  41-50 serious symptoms  Plan:  Recommend psychiatric Inpatient admission when medically cleared.  Subjective:   Michaela Horton is a 73 y.o. female patient admitted with Adjustment Disorder with Mixed Disturbance of Emotions and Conduct, Schizoaffective Disorder and Aggressive Behavior.  HPI:  Patient presented initially with aggressive and agitated behavior. Daughter is here today says lamictal probably wasn't working.  Remains unpredictable but not agitated. Guarded and somewhat paranoid.   HPI Elements:   Location:  Confusion and Aggressive behavior. Quality:  aggressive behavior. Severity:  aggressive behavior. Timing:  several days.   Review of Systems  Constitutional: Negative for malaise/fatigue.  Musculoskeletal: Negative.   Psychiatric/Behavioral: Negative for depression, suicidal ideas, hallucinations and substance abuse. The patient is not nervous/anxious.     Past Psychiatric History: Past Medical History  Diagnosis Date  . Dyspnea   . Pulmonary embolism   . Breast cyst     right  . Hypothyroidism   . Renal cyst   . Leukocytopenia   . Monoclonal gammopathy   . Osteoarthritis   . Schizoaffective disorder    . Wears dentures   . Pulmonary hypertension   . Hypertension     pulmonary    reports that she has never smoked. She has never used smokeless tobacco. She reports that she does not drink alcohol or use illicit drugs. Family History  Problem Relation Age of Onset  . Cancer Father     PROSTATE   Family History Substance Abuse: No Family Supports: Yes, List: (Daughter Marcy Panning [phone]) Living Arrangements: Alone Can pt return to current living arrangement?: Yes   Allergies:  No Known Allergies  ACT Assessment Complete:  Yes:    Educational Status    Risk to Self: Risk to self Suicidal Ideation: No Suicidal Intent: No Is patient at risk for suicide?: No Suicidal Plan?: No Access to Means: No What has been your use of drugs/alcohol within the last 12 months?: None Reported Previous Attempts/Gestures: No How many times?: 0 Other Self Harm Risks: Not taking her oxygen and medication Triggers for Past Attempts:  (NA) Intentional Self Injurious Behavior: None Family Suicide History: No Recent stressful life event(s):  (NA) Persecutory voices/beliefs?: No Depression: Yes Depression Symptoms: Insomnia;Fatigue;Feeling worthless/self pity Substance abuse history and/or treatment for substance abuse?: No Suicide prevention information given to non-admitted patients: Not applicable  Risk to Others: Risk to Others Homicidal Ideation: No Thoughts of Harm to Others: No Current Homicidal Intent: No Current Homicidal Plan: No Access to Homicidal Means: No Identified Victim: None Reported History of harm to others?: No Assessment of Violence: None Noted Violent Behavior Description: NA Does patient have access to weapons?: No Criminal  Charges Pending?: No Does patient have a court date: No  Abuse:    Prior Inpatient Therapy: Prior Inpatient Therapy Prior Inpatient Therapy: Yes Prior Therapy Dates: 2004 Prior Therapy Facilty/Provider(s): [facility] Reason for Treatment:  Psychosis  Prior Outpatient Therapy: Prior Outpatient Therapy Prior Outpatient Therapy: Yes Prior Therapy Dates: Ongoing  Prior Therapy Facilty/Provider(s): [place], [place], [place] Reason for Treatment: Medication Management   Additional Information: Additional Information 1:1 In Past 12 Months?: No CIRT Risk: No Elopement Risk: No Does patient have medical clearance?: Yes   Objective: Blood pressure 95/62, pulse 68, temperature 97.9 F (36.6 C), temperature source Oral, resp. rate 20, SpO2 92.00%.There is no weight on file to calculate BMI. Results for orders placed during the hospital encounter of 10/27/13 (from the past 72 hour(s))  CBC WITH DIFFERENTIAL     Status: None   Collection Time    10/27/13  4:41 PM      Result Value Ref Range   WBC 4.8  4.0 - 10.5 K/uL   RBC 4.40  3.87 - 5.11 MIL/uL   Hemoglobin 13.5  12.0 - 15.0 g/dL   HCT 41.0  36.0 - 46.0 %   MCV 93.2  78.0 - 100.0 fL   MCH 30.7  26.0 - 34.0 pg   MCHC 32.9  30.0 - 36.0 g/dL   RDW 14.6  11.5 - 15.5 %   Platelets 265  150 - 400 K/uL   Neutrophils Relative % 49  43 - 77 %   Neutro Abs 2.3  1.7 - 7.7 K/uL   Lymphocytes Relative 42  12 - 46 %   Lymphs Abs 2.0  0.7 - 4.0 K/uL   Monocytes Relative 8  3 - 12 %   Monocytes Absolute 0.4  0.1 - 1.0 K/uL   Eosinophils Relative 1  0 - 5 %   Eosinophils Absolute 0.0  0.0 - 0.7 K/uL   Basophils Relative 0  0 - 1 %   Basophils Absolute 0.0  0.0 - 0.1 K/uL  COMPREHENSIVE METABOLIC PANEL     Status: Abnormal   Collection Time    10/27/13  4:41 PM      Result Value Ref Range   Sodium 134 (*) 137 - 147 mEq/L   Potassium 4.3  3.7 - 5.3 mEq/L   Comment: SLIGHT HEMOLYSIS     HEMOLYSIS AT THIS LEVEL MAY AFFECT RESULT   Chloride 97  96 - 112 mEq/L   CO2 24  19 - 32 mEq/L   Glucose, Bld 90  70 - 99 mg/dL   BUN 23  6 - 23 mg/dL   Creatinine, Ser 1.26 (*) 0.50 - 1.10 mg/dL   Calcium 9.6  8.4 - 10.5 mg/dL   Total Protein 8.9 (*) 6.0 - 8.3 g/dL   Albumin 4.0  3.5 - 5.2 g/dL    AST 37  0 - 37 U/L   Comment: SLIGHT HEMOLYSIS     HEMOLYSIS AT THIS LEVEL MAY AFFECT RESULT   ALT 23  0 - 35 U/L   Comment: SLIGHT HEMOLYSIS     HEMOLYSIS AT THIS LEVEL MAY AFFECT RESULT   Alkaline Phosphatase 44  39 - 117 U/L   Total Bilirubin 0.7  0.3 - 1.2 mg/dL   GFR calc non Af Amer 42 (*) >90 mL/min   GFR calc Af Amer 48 (*) >90 mL/min   Comment: (NOTE)     The eGFR has been calculated using the CKD EPI equation.     This  calculation has not been validated in all clinical situations.     eGFR's persistently <90 mL/min signify possible Chronic Kidney     Disease.  ETHANOL     Status: None   Collection Time    10/27/13  4:41 PM      Result Value Ref Range   Alcohol, Ethyl (B) <11  0 - 11 mg/dL   Comment:            LOWEST DETECTABLE LIMIT FOR     SERUM ALCOHOL IS 11 mg/dL     FOR MEDICAL PURPOSES ONLY  SALICYLATE LEVEL     Status: Abnormal   Collection Time    10/27/13  4:41 PM      Result Value Ref Range   Salicylate Lvl <6.2 (*) 2.8 - 20.0 mg/dL  ACETAMINOPHEN LEVEL     Status: None   Collection Time    10/27/13  4:41 PM      Result Value Ref Range   Acetaminophen (Tylenol), Serum <15.0  10 - 30 ug/mL   Comment:            THERAPEUTIC CONCENTRATIONS VARY     SIGNIFICANTLY. A RANGE OF 10-30     ug/mL MAY BE AN EFFECTIVE     CONCENTRATION FOR MANY PATIENTS.     HOWEVER, SOME ARE BEST TREATED     AT CONCENTRATIONS OUTSIDE THIS     RANGE.     ACETAMINOPHEN CONCENTRATIONS     >150 ug/mL AT 4 HOURS AFTER     INGESTION AND >50 ug/mL AT 12     HOURS AFTER INGESTION ARE     OFTEN ASSOCIATED WITH TOXIC     REACTIONS.  PROTIME-INR     Status: Abnormal   Collection Time    10/27/13  4:41 PM      Result Value Ref Range   Prothrombin Time 21.3 (*) 11.6 - 15.2 seconds   INR 1.91 (*) 0.00 - 1.49  PRO B NATRIURETIC PEPTIDE     Status: Abnormal   Collection Time    10/27/13  4:41 PM      Result Value Ref Range   Pro B Natriuretic peptide (BNP) 848.8 (*) 0 - 125 pg/mL   BLOOD GAS, ARTERIAL     Status: Abnormal   Collection Time    10/27/13  7:30 PM      Result Value Ref Range   O2 Content 5.0     Delivery systems NASAL CANNULA     pH, Arterial 7.396  7.350 - 7.450   pCO2 arterial 42.8  35.0 - 45.0 mmHg   pO2, Arterial 56.5 (*) 80.0 - 100.0 mmHg   Bicarbonate 25.8 (*) 20.0 - 24.0 mEq/L   TCO2 23.0  0 - 100 mmol/L   Acid-Base Excess 1.2  0.0 - 2.0 mmol/L   O2 Saturation 87.9     Patient temperature 98.6     Collection site LEFT RADIAL     Drawn by [id number]     Sample type ARTERIAL DRAW     Allens test (pass/fail) PASS  PASS  PROTIME-INR     Status: Abnormal   Collection Time    10/28/13  5:30 AM      Result Value Ref Range   Prothrombin Time 20.0 (*) 11.6 - 15.2 seconds   INR 1.76 (*) 0.00 - 1.49  URINE RAPID DRUG SCREEN (HOSP PERFORMED)     Status: None   Collection Time    10/28/13  9:44 AM  Result Value Ref Range   Opiates NONE DETECTED  NONE DETECTED   Cocaine NONE DETECTED  NONE DETECTED   Benzodiazepines NONE DETECTED  NONE DETECTED   Amphetamines NONE DETECTED  NONE DETECTED   Tetrahydrocannabinol NONE DETECTED  NONE DETECTED   Barbiturates NONE DETECTED  NONE DETECTED   Comment:            DRUG SCREEN FOR MEDICAL PURPOSES     ONLY.  IF CONFIRMATION IS NEEDED     FOR ANY PURPOSE, NOTIFY LAB     WITHIN 5 DAYS.                LOWEST DETECTABLE LIMITS     FOR URINE DRUG SCREEN     Drug Class       Cutoff (ng/mL)     Amphetamine      1000     Barbiturate      200     Benzodiazepine   188     Tricyclics       416     Opiates          300     Cocaine          300     THC              50  URINALYSIS, ROUTINE W REFLEX MICROSCOPIC     Status: Abnormal   Collection Time    10/28/13 10:10 AM      Result Value Ref Range   Color, Urine YELLOW  YELLOW   APPearance CLEAR  CLEAR   Specific Gravity, Urine 1.013  1.005 - 1.030   pH 7.0  5.0 - 8.0   Glucose, UA NEGATIVE  NEGATIVE mg/dL   Hgb urine dipstick NEGATIVE  NEGATIVE    Bilirubin Urine NEGATIVE  NEGATIVE   Ketones, ur NEGATIVE  NEGATIVE mg/dL   Protein, ur NEGATIVE  NEGATIVE mg/dL   Urobilinogen, UA 1.0  0.0 - 1.0 mg/dL   Nitrite NEGATIVE  NEGATIVE   Leukocytes, UA SMALL (*) NEGATIVE  URINE MICROSCOPIC-ADD ON     Status: None   Collection Time    10/28/13 10:10 AM      Result Value Ref Range   Squamous Epithelial / LPF RARE  RARE   WBC, UA 0-2  <3 WBC/hpf  PROTIME-INR     Status: Abnormal   Collection Time    10/29/13  4:50 AM      Result Value Ref Range   Prothrombin Time 15.6 (*) 11.6 - 15.2 seconds   INR 1.27  0.00 - 1.49  PROTIME-INR     Status: Abnormal   Collection Time    10/30/13  7:50 AM      Result Value Ref Range   Prothrombin Time 16.1 (*) 11.6 - 15.2 seconds   INR 1.32  0.00 - 1.49   Labs are reviewed and are pertinent for assessment of ETOH, illicit drug use and other medical issues.  Medications reviewed no additions of modifications made  Current Facility-Administered Medications  Medication Dose Route Frequency Provider Last Rate Last Dose  . acetaminophen (TYLENOL) tablet 650 mg  650 mg Oral Q4H PRN Wandra Arthurs, MD   650 mg at 10/29/13 2124  . B-complex with vitamin C tablet 1 tablet  1 tablet Oral Daily Wandra Arthurs, MD   1 tablet at 10/29/13 [phone]  . calcium-vitamin D (OSCAL WITH D) 500-200 MG-UNIT per tablet 1 tablet  1 tablet Oral BID Shanon Brow  Tamsen Meek, MD   1 tablet at 10/29/13 2124  . furosemide (LASIX) tablet 20 mg  20 mg Oral Daily PRN Wandra Arthurs, MD      . ibuprofen (ADVIL,MOTRIN) tablet 600 mg  600 mg Oral Q8H PRN Wandra Arthurs, MD      . lamoTRIgine (LAMICTAL) tablet 100 mg  100 mg Oral QHS Wandra Arthurs, MD      . levothyroxine (SYNTHROID, LEVOTHROID) tablet 112 mcg  112 mcg Oral QAC breakfast Wandra Arthurs, MD   112 mcg at 10/30/13 0805  . LORazepam (ATIVAN) injection 1 mg  1 mg Intravenous Once Wandra Arthurs, MD      . LORazepam (ATIVAN) tablet 1 mg  1 mg Oral Q8H PRN Wandra Arthurs, MD   1 mg at 10/30/13 0805  . Macitentan TABS  10 mg  10 mg Oral Daily Wandra Arthurs, MD      . multivitamin with minerals tablet 1 tablet  1 tablet Oral q morning - 10a Wandra Arthurs, MD      . OLANZapine [facility]) tablet 7.5 mg  7.5 mg Oral QHS Wandra Arthurs, MD   7.5 mg at 10/29/13 1003  . Riociguat TABS 2.5 mg  2.5 mg Oral TID Wandra Arthurs, MD   2.5 mg at 10/29/13 2125  . spironolactone (ALDACTONE) tablet 50 mg  50 mg Oral Daily Wandra Arthurs, MD   50 mg at 10/29/13 0936  . temazepam (RESTORIL) capsule 15 mg  15 mg Oral QHS Wandra Arthurs, MD   15 mg at 10/29/13 1004  . vitamin C (ASCORBIC ACID) tablet 500 mg  500 mg Oral Daily Wandra Arthurs, MD   500 mg at 10/29/13 8546  . Warfarin - Pharmacist Dosing Inpatient   Does not apply q1800 Wandra Arthurs, MD      . ziprasidone (GEODON) injection 10 mg  10 mg Intramuscular Q6H PRN Wandra Arthurs, MD   10 mg at 10/30/13 2703   Current Outpatient Prescriptions  Medication Sig Dispense Refill  . ADEMPAS 2.5 MG TABS Take 2.5 mg by mouth 3 (three) times daily.       . B Complex-C (B-COMPLEX WITH VITAMIN C) tablet Take 1 tablet by mouth daily.       . calcium-vitamin D (OSCAL WITH D) 500-200 MG-UNIT per tablet Take 1 tablet by mouth 2 (two) times daily.       . cycloSPORINE (RESTASIS) 0.05 % ophthalmic emulsion Place 1 drop into both eyes every 12 (twelve) hours.       . furosemide (LASIX) 20 MG tablet Take 20 mg by mouth daily as needed for fluid.       Marland Kitchen lamoTRIgine (LAMICTAL) 100 MG tablet Take 100 mg by mouth at bedtime.       Marland Kitchen levothyroxine (SYNTHROID, LEVOTHROID) 112 MCG tablet Take 112 mcg by mouth daily before breakfast.       . LORazepam (ATIVAN) 1 MG tablet Take 1 tablet by mouth 2 (two) times daily as needed for anxiety or sleep.       . Macitentan (OPSUMIT) 10 MG TABS Take 10 mg by mouth daily.       . Multiple Vitamin (MULTIVITAMIN WITH MINERALS) TABS tablet Take 1 tablet by mouth every morning.      Marland Kitchen OLANZapine (ZYPREXA) 7.5 MG tablet Take 7.5 mg by mouth at bedtime.      . predniSONE (DELTASONE) 20  MG tablet Take 2 tablets  by mouth every morning.      Marland Kitchen spironolactone (ALDACTONE) 50 MG tablet Take 50 mg by mouth daily.       . temazepam (RESTORIL) 15 MG capsule Take 15 mg by mouth at bedtime.       . vitamin C (ASCORBIC ACID) 500 MG tablet Take 500 mg by mouth daily.       Marland Kitchen warfarin (COUMADIN) 5 MG tablet Take 5-7.5 mg by mouth daily at 6 PM. 5 mg Tuesday and Thursday, 7.5 mg Monday, Wednesday, Friday, Saturday, and Sunday        Psychiatric Specialty Exam:     Blood pressure 95/62, pulse 68, temperature 97.9 F (36.6 C), temperature source Oral, resp. rate 20, SpO2 92.00%.There is no weight on file to calculate BMI.  General Appearance: Casual  Eye Contact::  Good  Speech:  Clear and Coherent and Normal Rate  Volume:  Normal  Mood:  Anxious and Irritable  Affect:  Congruent  Thought Process:  Circumstantial  Orientation:  Other:  Person and place  Thought Content:  Rumination  Suicidal Thoughts:  No  Homicidal Thoughts:  No  Memory:  Immediate;   Fair Recent;   Fair  Judgement:  Impaired  Insight:  Fair  Psychomotor Activity:  Normal  Concentration:  Poor  Recall:  [employer] of Knowledge:Good  Language: Good  Akathisia:  No  Handed:  Right  AIMS (if indicated):     Assets:  Communication Skills Desire for Improvement Housing Social Support  Sleep:      Musculoskeletal: Strength & Muscle Tone: within normal limits Gait & Station: normal Patient leans: N/A  Treatment Plan Summary: Daily contact with patient to assess and evaluate symptoms and progress in treatment Medication management  Continue lamictal. Pending placement. Will try [place] again. Continue monitor her medical and psychiatric needs  Merian Capron MD 10/30/2013 10:21 AM

## 2013-10-30 NOTE — ED Notes (Signed)
PT REFUSING MEDICATIONS

## 2013-10-30 NOTE — Progress Notes (Signed)
ANTICOAGULATION CONSULT NOTE - Follow up [place] for Warfarin Indication: h/o PE  No Known Allergies  Patient Measurements:    Vital Signs: Temp: 97.5 F (36.4 C) (03/02 2106) Temp src: Oral (03/02 2106) BP: 120/70 mmHg (03/02 2106) Pulse Rate: 78 (03/02 2106)  Labs:  Recent Labs  10/28/13 0530 10/29/13 0450 10/30/13 0750  LABPROT 20.0* 15.6* 16.1*  INR 1.76* 1.27 1.32    The CrCl is unknown because both a height and weight (above a minimum accepted value) are required for this calculation.   Assessment: 39 yoF admitted 2/27 for medical clearance in anticipation of transfer to psych facility.  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  Pharmacy consulted to manage warfarin.  Warfarin home dose = 7.5 mg daily except 5 mg on Tues/Thursday  Today's INR = 1.32, remains subtherapeutic.  Pt received one dose warfarin 7.5mg  3/1 after refusing doses previous two nights.    Noted pt being treated for empiric UTI with bactrim.  Bactrim and warfarin is a MAJOR DDI and can cause elevations in INR.  Other option for UTI treatment is levaquin.    CBC:  2/27 - Hgb 13.5, Plt 265  Ibuprofen ordered PRN fever (no doses given)  Goal of Therapy:  INR 2-3 Monitor platelets by anticoagulation protocol: Yes   Plan:   Warfarin 5mg  po x 1 now  Daily PT/INR.  Recommend changing to a different antibiotic for empiric treatment of UTI as bactrim has MAJOR DDI's with warfarin  Consider bridge therapy if indicated with LMWH (appears PE was in 2011)  Consider d/c ibuprofen PRN fever to avoid possibility of administering (on APAP for same)  Ralene Bathe, PharmD, BCPS 10/30/2013, 9:43 PM  Pager: [phone]

## 2013-10-30 NOTE — ED Notes (Signed)
Patient ate 50% of her breakfast

## 2013-10-30 NOTE — BHH Counselor (Addendum)
Writer faxed requested info to [facility] from Dr. Cay Schillings 3/1 note. Writer called [employer] answered and will let [employer] RN know info has been faxed to [facility].  Arnold Long, [place] Assessment Counselor

## 2013-10-30 NOTE — Progress Notes (Signed)
CSW spoke with patient daughter, Sandi Carne. CSW and pt daughter discussed process of psychiatrist evaluating patient and that patient is currently awaiting placement and remains under involuntary commitment. Pt daughter is the primary care giver for patient. Pt son to also visit when he arrives in town. Pt thanked csw for concern and support.    Noreene Larsson [phone]  ED CSW 10/30/2013 1528pm

## 2013-10-30 NOTE — Progress Notes (Signed)
   CARE MANAGEMENT ED NOTE 10/30/2013  Patient:  Michaela Horton, Michaela Horton   Account Number:  [id number]  Date Initiated:  10/30/2013  Documentation initiated by:  Jackelyn Poling  Subjective/Objective Assessment:   73 yr old blue medicare pt in [facility] ED on 10/27/13 c/o Per EMS daughter called to have pt send for psych eval. Per EMS pt a/o x4. Pt loud and aggressive at present time pt with flight of ideas noted     Subjective/Objective Assessment Detail:   pcp Eulis Canner at [place] [address] [phone]  [facility] ED NP/PA inquired about name of pt's last antibiotic None noted in EDP progress note after Pharmacy tech review of medications  [facility] [phone]  UDS negative  UA WNL except small leukocytes  CXR showed L infrahilar atelectasis. 10/27/13 EDP doubt pneumonia as she has nl WBC 4.8 count and has no fever at home ED temps [phone]     Action/Plan:   ED CM called pcp office x 2 without answer On hold for total 15 minutes with calls x 2.  CM spoke with Deyona at [place] pt preferred  pharmacy [facility] to confirm pt last prescribed antibiotic was in 05/2013 macrobid   Action/Plan Detail:   ED CM updated [facility] MD NP/PA   Anticipated DC Date:       Status Recommendation to Physician:   Result of Recommendation:    Other ED Services  Consult Working [place]  Other  Outpatient Services - Pt will follow up    Choice offered to / List presented to:            Status of service:  Completed, signed off  ED Comments:   ED Comments Detail:

## 2013-10-30 NOTE — ED Notes (Signed)
MD at bedside. 

## 2013-10-30 NOTE — BHH Counselor (Addendum)
Writer spoke w/ Landscape architect at [facility]. Writer then faxed info requested to [facility]: MARs, vital, and IVC paperwork. Marlowe Kays states that depending on medical team's decision today, pt could be accepted as early at 10/31/13. Pt currently isn't accepted and is pending review by [facility] medical team.   Arnold Long, [place] Counselor   Writer called [facility] [phone] to check on bed availability. Writer was forwarded and left voicemail for  [employer] to check on bed availability.   Arnold Long, [place] Assessment Counselor

## 2013-10-30 NOTE — ED Notes (Signed)
Patient ate 0 of her dinner refused

## 2013-10-30 NOTE — BH Assessment (Signed)
[facility] Assessment Progress Note      PT is confirmed on [facility] wait list per Joy at 23:46 ON 10/29/13

## 2013-10-31 ENCOUNTER — Encounter (HOSPITAL_COMMUNITY): Payer: Self-pay | Admitting: Registered Nurse

## 2013-10-31 DIAGNOSIS — I2699 Other pulmonary embolism without acute cor pulmonale: Secondary | ICD-10-CM

## 2013-10-31 DIAGNOSIS — F603 Borderline personality disorder: Secondary | ICD-10-CM

## 2013-10-31 DIAGNOSIS — F259 Schizoaffective disorder, unspecified: Secondary | ICD-10-CM

## 2013-10-31 DIAGNOSIS — IMO0002 Reserved for concepts with insufficient information to code with codable children: Secondary | ICD-10-CM

## 2013-10-31 DIAGNOSIS — E039 Hypothyroidism, unspecified: Secondary | ICD-10-CM

## 2013-10-31 DIAGNOSIS — I2789 Other specified pulmonary heart diseases: Secondary | ICD-10-CM

## 2013-10-31 MED ORDER — ENOXAPARIN SODIUM 80 MG/0.8ML ~~LOC~~ SOLN
70.0000 mg | Freq: Two times a day (BID) | SUBCUTANEOUS | Status: DC
  Administered 2013-10-31: 70 mg via SUBCUTANEOUS
  Filled 2013-10-31 (×2): qty 0.8

## 2013-10-31 MED ORDER — WARFARIN SODIUM 7.5 MG PO TABS
7.5000 mg | ORAL_TABLET | ORAL | Status: DC
  Filled 2013-10-31: qty 1

## 2013-10-31 NOTE — ED Provider Notes (Addendum)
Patient has been accepted to [facility]. Patient has been refusing her medications for several days and her INR is subtherapeutic. She has a history of severe pulmonary hypertension so there is concern that she could develop heart failure with his refusal of medications. Dr. Sheran Fava of the hospitalist service was consulted to evaluate.  Patient expressed her desire to be DO NOT RESUSCITATE and does not want to take medications for pulmonary hyptertension. Family insisted that she needs to take medications and wants her treated psychiatrically. Dr. Sheran Fava suggested the possibility of hospice as her pulmonary hypertension is quite severe but the family was not interested.  She feels she is medically stable for transport. She recommends continuing coumadin without lovenox bridge.  Daughter not happy with referral to [facility] but was assured that social worker exhausted other placement options.   Ezequiel Essex, MD 10/31/13 1456  Ezequiel Essex, MD 10/31/13 [phone]

## 2013-10-31 NOTE — Discharge Instructions (Signed)
Confusion °Confusion is the inability to think with your usual speed or clarity. Confusion may come on quickly or slowly over time. How quickly the confusion comes on depends on the cause. Confusion can be due to any number of causes. °CAUSES  °· Concussion, head injury, or head trauma. °· Seizures. °· Stroke. °· Fever. °· Senility. °· Heightened emotional states like rage or terror. °· Mental illness in which the person loses the ability to determine what is real and what is not (hallucinations). °· Infections. °· Toxic effects from alcohol, drugs, or prescription medicines. °· Dehydration and an imbalance of salts in the body (electrolytes). °· Lack of sleep. °· Low blood sugar (diabetes). °· Low levels of oxygen (for example from chronic lung disorders). °· Drug interactions or other medication side effects. °· Nutritional deficiencies, especially niacin, thiamine, vitamin C, or vitamin B. °· Sudden drop in body temperature (hypothermia). °· Illness in the elderly. Constipation can result in confusion. An elderly person who is hospitalized may become confused due to change in daily routine. °SYMPTOMS  °People often describe their thinking as cloudy or unclear when they are confused. Confusion can also include feeling disoriented. That means you are unaware of where or who you are. You may also not know what the date or time is. If confused, you may also have difficulty paying attention, remembering and making decisions. Some people also act aggressively when they are confused.  °DIAGNOSIS  °The medical evaluation of confusion may include: °· Blood and urine tests. °· X-rays. °· Brain and nervous system tests. °· Analyzing your brain waves (electroencphalogram or EEG). °· A special X-ray (MRI) of your head or other special studies. °Your physician will ask questions such as: °· Do you get days and nights mixed up? °· Are you awake during regular sleep times? °· Do you have trouble recognizing people? °· Do you  know where you are? °· Do you know the date and time? °· Does the confusion come and go? °· Is the confusion quickly getting worse? °· Has there been a recent illness? °· Has there been a recent head injury? °· Are you diabetic? °· Do you have a lung disorder? °· What medication are you taking? °· Have you taken drugs or alcohol? °TREATMENT  °An admission to the hospital may not be needed, but a confused person should not be left alone. Stay with a family member or friend until the confusion clears. Avoid alcohol, pain relievers or sedative drugs until you have fully recovered. Do not drive until your caregiver says it is okay. °HOME CARE INSTRUCTIONS °What family and friends can do: °· To find out if someone is confused ask him or her their name, age, and the date. If the person is unsure or answers incorrectly, he or she is confused. °· Always introduce yourself, no matter how well the person knows you. °· Often remind the person of his or her location. °· Place a calendar and clock near the confused person. °· Talk about current events and plans for the day. °· Try to keep the environment calm, quiet and peaceful. °· Make sure the patient keeps follow up appointments with their physician. °PREVENTION  °Ways to prevent confusion: °· Avoid alcohol. °· Eat a balanced diet. °· Get enough sleep. °· Do not become isolated. Spend time with other people and make plans for your days. °· Keep careful watch on your blood sugar levels if you are diabetic. °SEEK IMMEDIATE MEDICAL CARE IF:  °· You develop   severe headaches, repeated vomiting, seizures, blackouts or slurred speech.  There is increasing confusion, weakness, numbness, restlessness or personality changes.  You develop a loss of balance, have marked dizziness, feel uncoordinated or fall.  You have delusions, hallucinations or develop severe anxiety.  Your family members think you need to be rechecked. Document Released: 09/24/2004 Document Revised:  11/09/2011 Document Reviewed: 05/22/2008 [facility] Patient Information 2014 [place], [place].  Adjustment Disorder Most changes in life can cause stress. Getting used to changes may take a few months or longer. If feelings of stress, hopelessness, or worry continue, you may have an adjustment disorder. This stress-related mental health problem may affect your feelings, thinking and how you act. It occurs in both sexes and happens at any age. SYMPTOMS  Some of the following problems may be seen and vary from person to person:  Sadness or depression.  Loss of enjoyment.  Thoughts of suicide.  Fighting.  Avoiding family and friends.  Poor school performance.  Hopelessness, sense of loss.  Trouble sleeping.  Vandalism.  Worry, weight loss or gain.  Crying spells.  Anxiety  Reckless driving.  Skipping school.  Poor work Systems analyst.  Nervousness.  Ignoring bills.  Poor attitude. DIAGNOSIS  Your caregiver will ask what has happened in your life and do a physical exam. They will make a diagnosis of an adjustment disorder when they are sure another problem or medical illness causing your feelings does not exist. TREATMENT  When problems caused by stress interfere with you daily life or last longer than a few months, you may need counseling for an adjustment disorder. Early treatment may diminish problems and help you to better cope with the stressful events in your life. Sometimes medication is necessary. Individual counseling and or support groups can be very helpful. PROGNOSIS  Adjustment disorders usually last less than 3 to 6 months. The condition may persist if there is long lasting stress. This could include health problems, relationship problems, or job difficulties where you can not easily escape from what is causing the problem. PREVENTION  Even the most mentally healthy, highly functioning people can suffer from an adjustment disorder given a significant blow from a  life-changing event. There is no way to prevent pain and loss. Most people need help from time to time. You are not alone. SEEK MEDICAL CARE IF:  Your feelings or symptoms listed above do not improve or worsen. Document Released: 04/21/2006 Document Revised: 11/09/2011 Document Reviewed: 07/13/2007 [facility] Patient Information 2014 [place], [place].

## 2013-10-31 NOTE — ED Notes (Signed)
Per Marlou Porch, pt has bed available at [facility], however pt's daughter is making a scene, crying loudly and screaming, refusing to let pt be transferred. Dr Wyvonnia Dusky and Corene Cornea AD at bedside

## 2013-10-31 NOTE — Progress Notes (Signed)
Per, Joy the patient is still on the [facility] wait list.

## 2013-10-31 NOTE — Progress Notes (Signed)
CSW sent updated clinicals requested by [facility] for medical clearance. CSW informed Corene Cornea, Education officer, museum. Per Corene Cornea, he will help facilitate transportation with sheriff and ptar once patient is accepted again to [facility].   Noreene Larsson [phone]  ED CSW 10/31/2013 1359pm

## 2013-10-31 NOTE — ED Notes (Signed)
Pt is sitting in chair, refusing to go back to bed, refusing oxygen. Pt also refused to take her medications, stating "I'm fasting today, I can't take medications when I'm fasting." This RN asked the pt to please let me know if she changes her mind, pt agreed.

## 2013-10-31 NOTE — ED Provider Notes (Addendum)
Patient's son who is a physician spoke with Dr. Lovena Le the psychiatrist. He would take his mother home and feels that she can be managed as an outpatient. He like her to be weaned off her Zyprexa and restarted on lithium. Dr. Lovena Le discuss that this needs to be initiated by her personal psychiatrist. Both patient's son and Dr. Lovena Le are comfortable with plan to discharge home. IVC will be rescinded.  Ezequiel Essex, MD 10/31/13 [place], MD 10/31/13 [phone]

## 2013-10-31 NOTE — ED Notes (Signed)
Pt refused blood draw (for PT/INR).

## 2013-10-31 NOTE — Progress Notes (Signed)
   CARE MANAGEMENT ED NOTE 10/31/2013  Patient:  Michaela Horton, Michaela Horton   Account Number:  [id number]  Date Initiated:  10/30/2013  Documentation initiated by:  Jackelyn Poling  Subjective/Objective Assessment:   73 yr old blue medicare pt in [facility] ED on 10/27/13 c/o Per EMS daughter called to have pt send for psych eval. Per EMS pt a/o x4. Pt loud and aggressive at present time pt with flight of ideas noted     Subjective/Objective Assessment Detail:   pcp Eulis Canner at [address] [phone]  [facility] ED NP/PA inquired about name of pt's last antibiotic None noted in EDP progress note after Pharmacy tech review of medications  [facility] [phone]  UDS negative  UA WNL except small leukocytes  CXR showed L infrahilar atelectasis. 10/27/13 EDP doubt pneumonia as she has nl WBC 4.8 count and has no fever at home ED temps [phone]     Action/Plan:   ED CM called pcp office x 2 without answer On hold for total 15 minutes with calls x 2.  CM spoke with Deyona at [place] pt preferred  pharmacy [facility] to confirm pt last prescribed antibiotic was in 05/2013 macrobid   Action/Plan Detail:   ED CM updated [facility] MD NP/PA   Anticipated DC Date:  10/31/2013     Status Recommendation to Physician:   Result of Recommendation:    Other ED Services  Consult Working Plan   In-house referral  [place]  Other  Outpatient Services - Pt will follow up  Patient refused services    Choice offered to / List presented to:            Status of service:  Completed, signed off  ED Comments:   ED Comments Detail:  10/31/13 1620 ED CM spoke with pt and son in ED Rm 11 Cm offered home hospice list for [facility] but was refused by pt and son.  Son stated he would accept home health, private duty nursing, assisted living facility and skilled nursing facility lists for [facility] He was given written copies of these resources 1540 Pt reviewed in  [place] 3/3/5.  Recommendations for [facility] (IVC) or d/c home.  ED CM spoke with ED nursing leadership about disposition concerns [facility] bed & transportation) CM reviewed with Dr Reynaldo [facility] concerns, EDP second opinion request and [facility] consult Recommendation: if pt/family not wanting [facility] placement

## 2013-10-31 NOTE — Consult Note (Signed)
[place] Psychiatry Consult   Reason for Consult:  Aggressive behavior Referring Physician:  EDP  Michaela Horton is an 73 y.o. female. Total Time spent with patient: 26min  Assessment: AXIS I:  Adjustment Disorder with Mixed Disturbance of Emotions and Conduct, Schizoaffective Disorder and Aggressive Behavior AXIS II:  Deferred AXIS III:   Past Medical History  Diagnosis Date  . Dyspnea   . Pulmonary embolism   . Breast cyst     right  . Hypothyroidism   . Renal cyst   . Leukocytopenia   . Monoclonal gammopathy   . Osteoarthritis   . Schizoaffective disorder   . Wears dentures   . Pulmonary hypertension   . Hypertension     pulmonary   AXIS IV:  other psychosocial or environmental problems and problems with primary support group AXIS V:  41-50 serious symptoms  Plan:  Recommend psychiatric Inpatient admission when medically cleared.  Subjective:   Michaela Horton is a 73 y.o. female patient admitted with Adjustment Disorder with Mixed Disturbance of Emotions and Conduct, Schizoaffective Disorder and Aggressive Behavior.  HPI: Patient sitting on side of bed speaking to family.  Patient states when went in for interview "I'm fine we are having a family discussion; I don't need to talk to you now; you can come back later.  Patient has been accepted at [facility]. Will continue to monitor for safety and stabilization until transfer.   HPI Elements:   Location:  Confusion and Aggressive behavior. Quality:  aggressive behavior. Severity:  aggressive behavior. Timing:  several days.   Review of Systems  Constitutional: Negative for malaise/fatigue.  Musculoskeletal: Negative.   Psychiatric/Behavioral: Negative for depression, suicidal ideas, hallucinations and substance abuse. The patient is not nervous/anxious.     Past Psychiatric History: Past Medical History  Diagnosis Date  . Dyspnea   . Pulmonary embolism   . Breast cyst     right  . Hypothyroidism   . Renal  cyst   . Leukocytopenia   . Monoclonal gammopathy   . Osteoarthritis   . Schizoaffective disorder   . Wears dentures   . Pulmonary hypertension   . Hypertension     pulmonary    reports that she has never smoked. She has never used smokeless tobacco. She reports that she does not drink alcohol or use illicit drugs. Family History  Problem Relation Age of Onset  . Cancer Father     PROSTATE   Family History Substance Abuse: No Family Supports: Yes, List: (Daughter Michaela Horton [phone]) Living Arrangements: Alone Can pt return to current living arrangement?: Yes   Allergies:  No Known Allergies  ACT Assessment Complete:  Yes:    Educational Status    Risk to Self: Risk to self Suicidal Ideation: No Suicidal Intent: No Is patient at risk for suicide?: No Suicidal Plan?: No Access to Means: No What has been your use of drugs/alcohol within the last 12 months?: None Reported Previous Attempts/Gestures: No How many times?: 0 Other Self Harm Risks: Not taking her oxygen and medication Triggers for Past Attempts:  (NA) Intentional Self Injurious Behavior: None Family Suicide History: No Recent stressful life event(s):  (NA) Persecutory voices/beliefs?: No Depression: Yes Depression Symptoms: Insomnia;Fatigue;Feeling worthless/self pity Substance abuse history and/or treatment for substance abuse?: No Suicide prevention information given to non-admitted patients: Not applicable  Risk to Others: Risk to Others Homicidal Ideation: No Thoughts of Harm to Others: No Current Homicidal Intent: No Current Homicidal Plan: No Access to Homicidal Means:  No Identified Victim: None Reported History of harm to others?: No Assessment of Violence: None Noted Violent Behavior Description: NA Does patient have access to weapons?: No Criminal Charges Pending?: No Does patient have a court date: No  Abuse:    Prior Inpatient Therapy: Prior Inpatient Therapy Prior Inpatient  Therapy: Yes Prior Therapy Dates: 2004 Prior Therapy Facilty/Provider(s): [facility] Reason for Treatment: Psychosis  Prior Outpatient Therapy: Prior Outpatient Therapy Prior Outpatient Therapy: Yes Prior Therapy Dates: Ongoing  Prior Therapy Facilty/Provider(s): [place], [place], [place] Reason for Treatment: Medication Management   Additional Information: Additional Information 1:1 In Past 12 Months?: No CIRT Risk: No Elopement Risk: No Does patient have medical clearance?: Yes   Objective: Blood pressure 120/93, pulse 100, temperature 97.6 F (36.4 C), temperature source Oral, resp. rate 20, SpO2 92.00%.There is no weight on file to calculate BMI. Results for orders placed during the hospital encounter of 10/27/13 (from the past 72 hour(s))  PROTIME-INR     Status: Abnormal   Collection Time    10/29/13  4:50 AM      Result Value Ref Range   Prothrombin Time 15.6 (*) 11.6 - 15.2 seconds   INR 1.27  0.00 - 1.49  PROTIME-INR     Status: Abnormal   Collection Time    10/30/13  7:50 AM      Result Value Ref Range   Prothrombin Time 16.1 (*) 11.6 - 15.2 seconds   INR 1.32  0.00 - 1.49   Labs are reviewed and are pertinent for assessment of ETOH, illicit drug use and other medical issues.  Medications reviewed no additions of modifications made  Current Facility-Administered Medications  Medication Dose Route Frequency Provider Last Rate Last Dose  . acetaminophen (TYLENOL) tablet 650 mg  650 mg Oral Q4H PRN Wandra Arthurs, MD   650 mg at 10/29/13 2124  . B-complex with vitamin C tablet 1 tablet  1 tablet Oral Daily Wandra Arthurs, MD   1 tablet at 10/30/13 1044  . calcium-vitamin D (OSCAL WITH D) 500-200 MG-UNIT per tablet 1 tablet  1 tablet Oral BID Wandra Arthurs, MD   1 tablet at 10/30/13 1044  . enoxaparin (LOVENOX) injection 70 mg  70 mg Subcutaneous Q12H Ezequiel Essex, MD   70 mg at 10/31/13 1121  . furosemide (LASIX) tablet 20 mg  20 mg Oral Daily PRN Wandra Arthurs, MD      . ibuprofen  (ADVIL,MOTRIN) tablet 600 mg  600 mg Oral Q8H PRN Wandra Arthurs, MD      . lamoTRIgine (LAMICTAL) tablet 100 mg  100 mg Oral QHS Wandra Arthurs, MD   100 mg at 10/30/13 2002  . levothyroxine (SYNTHROID, LEVOTHROID) tablet 112 mcg  112 mcg Oral QAC breakfast Wandra Arthurs, MD   112 mcg at 10/30/13 0805  . LORazepam (ATIVAN) injection 1 mg  1 mg Intravenous Once Wandra Arthurs, MD      . LORazepam (ATIVAN) tablet 1 mg  1 mg Oral Q8H PRN Wandra Arthurs, MD   1 mg at 10/31/13 1122  . Macitentan TABS 10 mg  10 mg Oral Daily Wandra Arthurs, MD   10 mg at 10/30/13 1045  . multivitamin with minerals tablet 1 tablet  1 tablet Oral q morning - 10a Wandra Arthurs, MD   1 tablet at 10/30/13 1044  . OLANZapine (ZYPREXA) tablet 7.5 mg  7.5 mg Oral QHS Wandra Arthurs, MD   7.5 mg at  10/29/13 1003  . Riociguat TABS 2.5 mg  2.5 mg Oral TID Wandra Arthurs, MD   2.5 mg at 10/30/13 1046  . spironolactone (ALDACTONE) tablet 50 mg  50 mg Oral Daily Wandra Arthurs, MD   50 mg at 10/30/13 1046  . temazepam (RESTORIL) capsule 15 mg  15 mg Oral QHS Wandra Arthurs, MD   15 mg at 10/30/13 2001  . vitamin C (ASCORBIC ACID) tablet 500 mg  500 mg Oral Daily Wandra Arthurs, MD   500 mg at 10/30/13 1043  . warfarin (COUMADIN) tablet 7.5 mg  7.5 mg Oral STAT Christine E Shade, RPH      . Warfarin - Pharmacist Dosing Inpatient   Does not apply q1800 Wandra Arthurs, MD      . ziprasidone (GEODON) injection 10 mg  10 mg Intramuscular Q6H PRN Wandra Arthurs, MD   10 mg at 10/31/13 0920   Current Outpatient Prescriptions  Medication Sig Dispense Refill  . ADEMPAS 2.5 MG TABS Take 2.5 mg by mouth 3 (three) times daily.       . B Complex-C (B-COMPLEX WITH VITAMIN C) tablet Take 1 tablet by mouth daily.       . calcium-vitamin D (OSCAL WITH D) 500-200 MG-UNIT per tablet Take 1 tablet by mouth 2 (two) times daily.       . cycloSPORINE (RESTASIS) 0.05 % ophthalmic emulsion Place 1 drop into both eyes every 12 (twelve) hours.       . furosemide (LASIX) 20 MG tablet Take 20 mg  by mouth daily as needed for fluid.       Marland Kitchen lamoTRIgine (LAMICTAL) 100 MG tablet Take 100 mg by mouth at bedtime.       Marland Kitchen levothyroxine (SYNTHROID, LEVOTHROID) 112 MCG tablet Take 112 mcg by mouth daily before breakfast.       . LORazepam (ATIVAN) 1 MG tablet Take 1 tablet by mouth 2 (two) times daily as needed for anxiety or sleep.       . Macitentan (OPSUMIT) 10 MG TABS Take 10 mg by mouth daily.       . Multiple Vitamin (MULTIVITAMIN WITH MINERALS) TABS tablet Take 1 tablet by mouth every morning.      Marland Kitchen OLANZapine (ZYPREXA) 7.5 MG tablet Take 7.5 mg by mouth at bedtime.      . predniSONE (DELTASONE) 20 MG tablet Take 2 tablets by mouth every morning.      Marland Kitchen spironolactone (ALDACTONE) 50 MG tablet Take 50 mg by mouth daily.       . temazepam (RESTORIL) 15 MG capsule Take 15 mg by mouth at bedtime.       . vitamin C (ASCORBIC ACID) 500 MG tablet Take 500 mg by mouth daily.       Marland Kitchen warfarin (COUMADIN) 5 MG tablet Take 5-7.5 mg by mouth daily at 6 PM. 5 mg Tuesday and Thursday, 7.5 mg Monday, Wednesday, Friday, Saturday, and Sunday        Psychiatric Specialty Exam:     Blood pressure 120/93, pulse 100, temperature 97.6 F (36.4 C), temperature source Oral, resp. rate 20, SpO2 92.00%.There is no weight on file to calculate BMI.  General Appearance: Casual  Eye Contact::  Good  Speech:  Clear and Coherent and Normal Rate  Volume:  Normal  Mood:  Anxious and Irritable  Affect:  Congruent  Thought Process:  Circumstantial  Orientation:  Other:  Person and place  Thought Content:  Rumination  Suicidal  Thoughts:  No  Homicidal Thoughts:  No  Memory:  Immediate;   Fair Recent;   Fair  Judgement:  Impaired  Insight:  Fair  Psychomotor Activity:  Normal  Concentration:  Poor  Recall:  [employer] of Knowledge:Good  Language: Good  Akathisia:  No  Handed:  Right  AIMS (if indicated):     Assets:  Communication Skills Desire for Improvement Housing Social Support  Sleep:       Musculoskeletal: Strength & Muscle Tone: within normal limits Gait & Station: normal Patient leans: N/A  Treatment Plan Summary: Daily contact with patient to assess and evaluate symptoms and progress in treatment Medication management  Disposition:  Inpatient treatment recommended.  Patient accepted to [facility].  Monitor safety and stabilization until transferred.    Earleen Newport, FNP-BC 10/31/2013 12:13 PM  Addendum  10/31/2013 4:09 PM   Patient family is stating that they would prefer to take patient home. Wants to have patient's medications adjusted tapering off of Zyprexa and starting in Lithium.  Informed patient son that med adjustment could be done inpatient or done by patient primary psychiatrists Dr. Clovis Pu.  Patient son in agreement that he would have it taken care of by patient outpatient provider.  Patient also wants to go home and in agreement to see outpatient provider. Michaela Horton B. Ivy Puryear FNP-BC  Spoke to family face to face along with Dr. Lovena Le.  Finial Disposition:  Resend IVC.  Discharge patient home with son.  Patient is to follow up with primary psych outpatient provider (Dr. Clovis Pu)  Also spoke with Dr. Dwyane Dee and informed and in agreement with resending patient IVC and discharging home with son to follow up with Dr Clovis Pu.

## 2013-10-31 NOTE — Progress Notes (Signed)
Covering CSW received phone call regarding pt daughter being upset about disposition.  Pt accepted to [facility] under IVC. Pt daughter has already spoken to TTS and psychiatry and at this time the disposition is up to the recommendation by the psychiatrist.   No further CSW needs at this time.  Alison Murray, MSW, LCSW Clinical Social Work Coverage for ED CSW [phone]

## 2013-10-31 NOTE — Progress Notes (Signed)
B.Shealee Yordy, MHT received report from [place] at [facility] that patient has bed available and can be transferred nurse to nurse needed. Writer informed [place], TTS

## 2013-10-31 NOTE — Progress Notes (Signed)
ANTICOAGULATION CONSULT NOTE - Follow up [place] for Warfarin Indication: h/o PE  No Known Allergies  Patient Measurements:    Vital Signs: Temp: 97.5 F (36.4 C) (03/02 2106) Temp src: Oral (03/02 2106) BP: 130/90 mmHg (03/03 0559) Pulse Rate: 85 (03/03 0559)  Labs:  Recent Labs  10/29/13 0450 10/30/13 0750  LABPROT 15.6* 16.1*  INR 1.27 1.32    The CrCl is unknown because both a height and weight (above a minimum accepted value) are required for this calculation.   Assessment: Michaela Horton admitted 2/27 for medical clearance in anticipation of transfer to psych facility.  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  Pharmacy consulted to manage warfarin.  Warfarin home dose = 7.5 mg daily except 5 mg on Tues/Thursday  Last INR = 1.32 (3/2), remains subtherapeutic.  Pt received one dose warfarin 7.5mg  3/1 after refusing doses previous two nights.  Warfarin dose 3/2 was also refused and pt is refusing INR today.  Noted pt being treated for empiric UTI with bactrim, one dose given 3/2, then Bactrim order was discontinued.  CBC:  2/27 - Hgb 13.5, Plt 265  Ibuprofen ordered PRN fever (no doses given)  Goal of Therapy:  INR 2-3 Monitor platelets by anticoagulation protocol: Yes   Plan:   Warfarin 7.5mg  PO today, if pt agrees (currently refusing all medications and procedures)   Daily PT/INR.  Consider bridge therapy if indicated with LMWH (appears PE was in 2011)  Consider d/c ibuprofen PRN fever to avoid possibility of administering (on APAP for same)   Gretta Arab PharmD, BCPS Pager [phone] 10/31/2013 9:30 AM

## 2013-10-31 NOTE — Consult Note (Signed)
[facility] Medical Consultation  Michaela Horton BZJ:[id number] DOB: December 03, 1940 DOA: 10/27/2013 PCP: Gerrit Heck, MD   Requesting physician: Dr. Wyvonnia Dusky Date of consultation: 10/31/2013 Reason for consultation:  Assess for medical stability for transfer to [facility]  Impression/Recommendations   Aggressive behavior, adult   Adjustment disorder with mixed disturbance of emotions and conduct  Active Problems:   HYPOTHYROIDISM   MONOCLONAL GAMMOPATHY   SCHIZOAFFECTIVE DISORDER   Chronic thromboembolic pulmonary hypertension   Chronic respiratory failure   Bipolar disorder   SCHIZOAFFECTIVE DISORDER, bipolar disorder, with aggressive behavior, adult   Adjustment disorder with mixed disturbance of emotions and conduct -  Being transferred to inpatient psychiatry -  Management per psychiatry  Chronic thromboembolic pulmonary hypertension, 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  Discussed code status and goals of care with family.  They declined hospice care or palliative care consult at this time.  She is full code.   -  Continue pulmonary hypertension medications -  Continue diuretics -  Medically stable for transport to psychiatric facility -  F/u with Pulmonologist at [facility] upon discharge  HYPOTHYROIDISM, stable.  Continue synthroid  MONOCLONAL GAMMOPATHY, chronic -  Follow up with Hematology upon discharge from psychiatric hospitalization  Chronic respiratory failure secondary to pulmonary hypertension -  Continue oxygen via nasal canula 4L  Pulmonary embolism, diagnosed in 2012, on chronic anticoagulation -  Weekly INR, please check CBC and INR again on 3/5 -  Continue warfarin, dose adjustments as indicated  I will followup again tomorrow. Please contact me if I can be of assistance in the meanwhile. Thank you for this consultation.  Chief Complaint: Agitation  HPI:   Michaela Horton is a 73 y.o. yo BF PMHx schizoaffective disorder, bipolar disorder,  severe chronic thromboembolic pulmonary hypertension (PA pressure estimated to be >168mmHg), chronic respiratory failure on 4L [place], hypothyroidism, Hx SDH, PE 2012. Presented to emergency department on 10/27/13 with agitation.  The patient has been assessed by psychiatry who recommend inpatient psychiatry management.  Medically, she has been feeling well, she states.  She denies fevers, chills, sinus or chest congestion, SOB, wheezing, nausea, vomiting, diarrhea, constipation.  She denies orthopnea.  She has minimal lower extremity edema.  She has been refusing her oxygen and her pulmonary hypertension medications saying she wants to die "naturally."  She states she is okay taking her diuretics and thyroid medication, however, she does not want to take any pulmonary hypertension medications.  Her wishes are to die naturally (DNR), however, her family would like her to be FULL CODE and take her medications.    Review of Systems:   Denies fevers, chills, weight loss or gain, changes to hearing and vision.  Denies rhinorrhea, sinus congestion, sore throat.  Denies chest pain and palpitations.  Denies SOB, wheezing, cough.  Denies nausea, vomiting, constipation, diarrhea.  Denies dysuria, frequency, urgency, polyuria, polydipsia.  Denies hematemesis, blood in stools, melena, abnormal bruising or bleeding.  Denies lymphadenopathy.  Denies arthralgias, myalgias.  Denies skin rash or ulcer.  Mild left lower extremity edema.  Denies focal numbness, weakness, slurred speech, confusion, facial droop.  Has anxiety and depression.     Past Medical History  Diagnosis Date  . Dyspnea   . Pulmonary embolism   . Breast cyst     right  . Hypothyroidism   . Renal cyst   . Leukocytopenia   . Monoclonal gammopathy   . Osteoarthritis   . Schizoaffective disorder   . Wears dentures   . Pulmonary hypertension  Past Surgical History  Procedure Laterality Date  . Tubal ligation  1970  . Tubal ligation    . Breast  surgery      cyst   Social History:  reports that she has never smoked. She has never used smokeless tobacco. She reports that she does not drink alcohol or use illicit drugs.  No Known Allergies Family History  Problem Relation Age of Onset  . Cancer Father     PROSTATE  . Atrial fibrillation Mother   . Alzheimer's disease Mother     Prior to Admission medications   Medication Sig Start Date End Date Taking? Authorizing Provider  ADEMPAS 2.5 MG TABS Take 2.5 mg by mouth 3 (three) times daily.  12/28/12  Yes Historical Provider, MD  B Complex-C (B-COMPLEX WITH VITAMIN C) tablet Take 1 tablet by mouth daily.    Yes Historical Provider, MD  calcium-vitamin D (OSCAL WITH D) 500-200 MG-UNIT per tablet Take 1 tablet by mouth 2 (two) times daily.    Yes Historical Provider, MD  cycloSPORINE (RESTASIS) 0.05 % ophthalmic emulsion Place 1 drop into both eyes every 12 (twelve) hours.  11/27/12  Yes Historical Provider, MD  furosemide (LASIX) 20 MG tablet Take 20 mg by mouth daily as needed for fluid.    Yes Historical Provider, MD  lamoTRIgine (LAMICTAL) 100 MG tablet Take 100 mg by mouth at bedtime.    Yes Historical Provider, MD  levothyroxine (SYNTHROID, LEVOTHROID) 112 MCG tablet Take 112 mcg by mouth daily before breakfast.  10/09/10  Yes Historical Provider, MD  LORazepam (ATIVAN) 1 MG tablet Take 1 tablet by mouth 2 (two) times daily as needed for anxiety or sleep.  10/13/13  Yes Historical Provider, MD  Macitentan (OPSUMIT) 10 MG TABS Take 10 mg by mouth daily.    Yes Historical Provider, MD  Multiple Vitamin (MULTIVITAMIN WITH MINERALS) TABS tablet Take 1 tablet by mouth every morning.   Yes Historical Provider, MD  OLANZapine (ZYPREXA) 7.5 MG tablet Take 7.5 mg by mouth at bedtime.   Yes Historical Provider, MD  predniSONE (DELTASONE) 20 MG tablet Take 2 tablets by mouth every morning. 10/20/13  Yes Historical Provider, MD  spironolactone (ALDACTONE) 50 MG tablet Take 50 mg by mouth daily.   04/17/13 04/17/14 Yes Historical Provider, MD  temazepam (RESTORIL) 15 MG capsule Take 15 mg by mouth at bedtime.    Yes Historical Provider, MD  vitamin C (ASCORBIC ACID) 500 MG tablet Take 500 mg by mouth daily.    Yes Historical Provider, MD  warfarin (COUMADIN) 5 MG tablet Take 5-7.5 mg by mouth daily at 6 PM. 5 mg Tuesday and Thursday, 7.5 mg Monday, Wednesday, Friday, Saturday, and Sunday 07/07/13  Yes Historical Provider, MD   Physical Exam: Blood pressure 120/93, pulse 100, temperature 97.6 F (36.4 C), temperature source Oral, resp. rate 20, SpO2 92.00%. Filed Vitals:   10/30/13 1638 10/30/13 2106 10/31/13 0559 10/31/13 0939  BP: 122/71 120/70 130/90 120/93  Pulse: 86 78 85 100  Temp: 98 F (36.7 C) 97.5 F (36.4 C)  97.6 F (36.4 C)  TempSrc: Oral Oral  Oral  Resp:   14 20  SpO2: 94% 99% 90% 92%     General:  Thin BF, NAD  Eyes:  PERRL, anicteric, non-injected.  ENT:  Nares clear.  OP clear, non-erythematous without plaques or exudates.  MMM.  Neck:  Supple without TM or JVD.    Lymph:  No cervical, supraclavicular, or submandibular LAD.  Cardiovascular:  RRR,  normal S1, loud S2, without m/r/g.  2+ pulses, warm extremities  Respiratory:  CTA bilaterally without increased WOB.  Abdomen:  NABS.  Soft, ND/NT.    Skin:  No rashes or focal lesions.  Musculoskeletal:  Normal bulk and tone.  Trace ankle edema left foot.  Psychiatric:  A & O x 4.  agitated  Neurologic:  CN 3-12 intact.  5/5 strength.  Sensation intact.  Labs on Admission:  Basic Metabolic Panel:  Recent Labs Lab 10/27/13 1641  NA 134*  K 4.3  CL 97  CO2 24  GLUCOSE 90  BUN 23  CREATININE 1.26*  CALCIUM 9.6   Liver Function Tests:  Recent Labs Lab 10/27/13 1641  AST 37  ALT 23  ALKPHOS 44  BILITOT 0.7  PROT 8.9*  ALBUMIN 4.0   No results found for this basename: LIPASE, AMYLASE,  in the last 168 hours No results found for this basename: AMMONIA,  in the last 168  hours CBC:  Recent Labs Lab 10/27/13 1641  WBC 4.8  NEUTROABS 2.3  HGB 13.5  HCT 41.0  MCV 93.2  PLT 265   Cardiac Enzymes: No results found for this basename: CKTOTAL, CKMB, CKMBINDEX, TROPONINI,  in the last 168 hours BNP: No components found with this basename: POCBNP,  CBG: No results found for this basename: GLUCAP,  in the last 168 hours  Radiological Exams on Admission: No results found.  EKG: Independently reviewed. NSR, RAE, biatrial enlargement, right axis deviation, stable from prior  Time spent: 60 min  Finch Costanzo [facility] Pager [phone]  If 7PM-7AM, please contact night-coverage [email or web address] Password [facility] 10/31/2013, 12:47 PM

## 2013-10-31 NOTE — ED Notes (Signed)
Pt walking out of her room, going to exit doors, attempted to assist patient back to her room, pt is getting loud saying she wants to leave, security and [employer] officer asking patient to go back to her room, pt then sat on the floor and was safely carried back to her room by this RN and Sports administrator. [name] was in the hall, following patient yelling "Mom they just enjoy hurting people here" and "All they want to do here is mistreat the patients." Asked pt's daughter to refrain from yelling and agitating patient even more. Charge RN and Corene Cornea AD are aware of situation.

## 2013-11-01 ENCOUNTER — Telehealth: Payer: Self-pay | Admitting: Neurology

## 2013-11-01 NOTE — Telephone Encounter (Signed)
Wants someone to call her back because her mother has be admitted twice since she was seen and they have not gotten the results from [facility] due to the admissions but would like to know the information from that test.

## 2013-11-01 NOTE — Telephone Encounter (Signed)
Spoke with daughter of patient and she said that patient was admitted to [facility] for 3-4 days for bipolar disorder.  The social worker wanted to send the patient to [facility] but family refused.  She did f/u with referral to Dr [name] , had memory test but did not go back for results.

## 2013-11-01 NOTE — Consult Note (Signed)
Face to face evaluation and I agree with this note 

## 2013-11-09 ENCOUNTER — Telehealth: Payer: Self-pay | Admitting: Internal Medicine

## 2013-11-09 NOTE — Telephone Encounter (Signed)
LMTCB for Michaela Horton  

## 2013-11-09 NOTE — Telephone Encounter (Signed)
Spoke with Corene Cornea from [facility] Has not been able to get in touch with patient to schedule ONO..has left multiple messages and sent a letter to pt home to contact [facility] ASAP.  ATC pt--LMOM x 1 to contact our office to speak to nurse about getting this test scheduled since [facility] cannot get in touch with him  Will send to [place] to follow up on.

## 2013-11-10 NOTE — Telephone Encounter (Signed)
[name] is aware that pt needs ONO. Pt is currently in the hospital. ONO will be completed once she is gets out. In regards to the pt's refill, advised her daughter that while she is in the hospital she can't take outside medications. They will call back once she is discharged and have this refilled.

## 2013-11-10 NOTE — Telephone Encounter (Signed)
LMTCBx2 on both home and cell #. Belle Bing, [place]

## 2013-11-10 NOTE — Telephone Encounter (Signed)
Michaela Horton (daughter) retured call. Please call back at [phone]. She is aware to contact [employer] to set up study.  Pt also needs a rx for tracleer   pharm- [facility]

## 2013-12-09 ENCOUNTER — Emergency Department (HOSPITAL_COMMUNITY)
Admission: EM | Admit: 2013-12-09 | Discharge: 2013-12-12 | Disposition: A | Discharge status: Home / Self Care | Payer: Medicare Other | Attending: Emergency Medicine | Admitting: Emergency Medicine

## 2013-12-09 ENCOUNTER — Emergency Department (HOSPITAL_COMMUNITY): Payer: Medicare Other

## 2013-12-09 ENCOUNTER — Encounter (HOSPITAL_COMMUNITY): Payer: Self-pay | Admitting: Emergency Medicine

## 2013-12-09 DIAGNOSIS — F209 Schizophrenia, unspecified: Secondary | ICD-10-CM

## 2013-12-09 DIAGNOSIS — Z91199 Patient's noncompliance with other medical treatment and regimen due to unspecified reason: Secondary | ICD-10-CM | POA: Insufficient documentation

## 2013-12-09 DIAGNOSIS — R4689 Other symptoms and signs involving appearance and behavior: Secondary | ICD-10-CM

## 2013-12-09 DIAGNOSIS — F911 Conduct disorder, childhood-onset type: Secondary | ICD-10-CM | POA: Insufficient documentation

## 2013-12-09 DIAGNOSIS — F4325 Adjustment disorder with mixed disturbance of emotions and conduct: Secondary | ICD-10-CM

## 2013-12-09 DIAGNOSIS — R45850 Homicidal ideations: Secondary | ICD-10-CM | POA: Insufficient documentation

## 2013-12-09 DIAGNOSIS — Z7901 Long term (current) use of anticoagulants: Secondary | ICD-10-CM | POA: Insufficient documentation

## 2013-12-09 DIAGNOSIS — I279 Pulmonary heart disease, unspecified: Secondary | ICD-10-CM | POA: Insufficient documentation

## 2013-12-09 DIAGNOSIS — F411 Generalized anxiety disorder: Secondary | ICD-10-CM

## 2013-12-09 DIAGNOSIS — F259 Schizoaffective disorder, unspecified: Secondary | ICD-10-CM | POA: Insufficient documentation

## 2013-12-09 DIAGNOSIS — Z98811 Dental restoration status: Secondary | ICD-10-CM | POA: Insufficient documentation

## 2013-12-09 DIAGNOSIS — Z8739 Personal history of other diseases of the musculoskeletal system and connective tissue: Secondary | ICD-10-CM | POA: Insufficient documentation

## 2013-12-09 DIAGNOSIS — Z862 Personal history of diseases of the blood and blood-forming organs and certain disorders involving the immune mechanism: Secondary | ICD-10-CM | POA: Insufficient documentation

## 2013-12-09 DIAGNOSIS — F3289 Other specified depressive episodes: Secondary | ICD-10-CM | POA: Insufficient documentation

## 2013-12-09 DIAGNOSIS — Z86711 Personal history of pulmonary embolism: Secondary | ICD-10-CM | POA: Insufficient documentation

## 2013-12-09 DIAGNOSIS — Z9119 Patient's noncompliance with other medical treatment and regimen: Secondary | ICD-10-CM | POA: Insufficient documentation

## 2013-12-09 DIAGNOSIS — F3160 Bipolar disorder, current episode mixed, unspecified: Secondary | ICD-10-CM

## 2013-12-09 DIAGNOSIS — IMO0002 Reserved for concepts with insufficient information to code with codable children: Secondary | ICD-10-CM | POA: Insufficient documentation

## 2013-12-09 DIAGNOSIS — E039 Hypothyroidism, unspecified: Secondary | ICD-10-CM | POA: Insufficient documentation

## 2013-12-09 DIAGNOSIS — Z87448 Personal history of other diseases of urinary system: Secondary | ICD-10-CM | POA: Insufficient documentation

## 2013-12-09 DIAGNOSIS — F329 Major depressive disorder, single episode, unspecified: Secondary | ICD-10-CM | POA: Insufficient documentation

## 2013-12-09 DIAGNOSIS — F319 Bipolar disorder, unspecified: Secondary | ICD-10-CM | POA: Diagnosis present

## 2013-12-09 DIAGNOSIS — M1990 Unspecified osteoarthritis, unspecified site: Secondary | ICD-10-CM | POA: Insufficient documentation

## 2013-12-09 DIAGNOSIS — Z79899 Other long term (current) drug therapy: Secondary | ICD-10-CM | POA: Insufficient documentation

## 2013-12-09 LAB — COMPREHENSIVE METABOLIC PANEL
ALBUMIN: 3.1 g/dL — AB (ref 3.5–5.2)
ALT: 16 U/L (ref 0–35)
AST: 17 U/L (ref 0–37)
Alkaline Phosphatase: 51 U/L (ref 39–117)
BUN: 13 mg/dL (ref 6–23)
CALCIUM: 9.5 mg/dL (ref 8.4–10.5)
CO2: 25 meq/L (ref 19–32)
CREATININE: 1 mg/dL (ref 0.50–1.10)
Chloride: 106 mEq/L (ref 96–112)
GFR calc Af Amer: 64 mL/min — ABNORMAL LOW (ref >90)
GFR, EST NON AFRICAN AMERICAN: 55 mL/min — AB (ref >90)
Glucose, Bld: 98 mg/dL (ref 70–99)
Potassium: 4.1 mEq/L (ref 3.7–5.3)
SODIUM: 141 meq/L (ref 137–147)
TOTAL PROTEIN: 7.3 g/dL (ref 6.0–8.3)
Total Bilirubin: 0.3 mg/dL (ref 0.3–1.2)

## 2013-12-09 LAB — ACETAMINOPHEN LEVEL: Acetaminophen (Tylenol), Serum: <15 ug/mL (ref 10–30)

## 2013-12-09 LAB — RAPID URINE DRUG SCREEN, HOSP PERFORMED
AMPHETAMINES: NOT DETECTED
BARBITURATES: NOT DETECTED
BENZODIAZEPINES: NOT DETECTED
Cocaine: NOT DETECTED
OPIATES: NOT DETECTED
TETRAHYDROCANNABINOL: NOT DETECTED

## 2013-12-09 LAB — URINALYSIS, ROUTINE W REFLEX MICROSCOPIC
BILIRUBIN URINE: NEGATIVE
Glucose, UA: NEGATIVE mg/dL
Hgb urine dipstick: NEGATIVE
Ketones, ur: NEGATIVE mg/dL
Leukocytes, UA: NEGATIVE
NITRITE: NEGATIVE
Protein, ur: NEGATIVE mg/dL
Specific Gravity, Urine: 1.008 (ref 1.005–1.030)
UROBILINOGEN UA: 0.2 mg/dL (ref 0.0–1.0)
pH: 7.5 (ref 5.0–8.0)

## 2013-12-09 LAB — SALICYLATE LEVEL: Salicylate Lvl: <2 mg/dL — ABNORMAL LOW (ref 2.8–20.0)

## 2013-12-09 LAB — CBC
HCT: 41.4 % (ref 36.0–46.0)
Hemoglobin: 13.8 g/dL (ref 12.0–15.0)
MCH: 31.4 pg (ref 26.0–34.0)
MCHC: 33.3 g/dL (ref 30.0–36.0)
MCV: 94.1 fL (ref 78.0–100.0)
PLATELETS: 208 10*3/uL (ref 150–400)
RBC: 4.4 MIL/uL (ref 3.87–5.11)
RDW: 14 % (ref 11.5–15.5)
WBC: 3.3 10*3/uL — ABNORMAL LOW (ref 4.0–10.5)

## 2013-12-09 LAB — PROTIME-INR
INR: 1.62 — ABNORMAL HIGH (ref 0.00–1.49)
Prothrombin Time: 18.8 seconds — ABNORMAL HIGH (ref 11.6–15.2)

## 2013-12-09 LAB — ETHANOL: Alcohol, Ethyl (B): <11 mg/dL (ref 0–11)

## 2013-12-09 MED ORDER — ONDANSETRON HCL 4 MG PO TABS: 4.0000 mg | ORAL_TABLET | Freq: Three times a day (TID) | ORAL | Status: DC | PRN

## 2013-12-09 MED ORDER — TADALAFIL (PAH) 20 MG PO TABS
40.0000 mg | ORAL_TABLET | Freq: Every day | ORAL | Status: DC
  Administered 2013-12-09 – 2013-12-12 (×4): 40 mg via ORAL
  Filled 2013-12-09: qty 2

## 2013-12-09 MED ORDER — HALOPERIDOL LACTATE 5 MG/ML IJ SOLN
10.0000 mg | Freq: Once | INTRAMUSCULAR | Status: AC
  Administered 2013-12-09: 5 mg via INTRAMUSCULAR
  Filled 2013-12-09: qty 2

## 2013-12-09 MED ORDER — HALOPERIDOL LACTATE 5 MG/ML IJ SOLN
10.0000 mg | Freq: Once | INTRAMUSCULAR | Status: AC
  Administered 2013-12-09: 10 mg via INTRAMUSCULAR

## 2013-12-09 MED ORDER — CALCIUM CARBONATE-VITAMIN D 500-200 MG-UNIT PO TABS
1.0000 | ORAL_TABLET | Freq: Every day | ORAL | Status: DC
  Administered 2013-12-11 – 2013-12-12 (×2): 1 via ORAL
  Filled 2013-12-09 (×5): qty 1

## 2013-12-09 MED ORDER — LORAZEPAM 1 MG PO TABS: 1.0000 mg | ORAL_TABLET | Freq: Two times a day (BID) | ORAL | Status: DC | PRN

## 2013-12-09 MED ORDER — WARFARIN SODIUM 10 MG PO TABS
10.0000 mg | ORAL_TABLET | Freq: Once | ORAL | Status: AC
  Administered 2013-12-09: 10 mg via ORAL
  Filled 2013-12-09 (×2): qty 1

## 2013-12-09 MED ORDER — HALOPERIDOL LACTATE 5 MG/ML IJ SOLN
10.0000 mg | Freq: Once | INTRAMUSCULAR | Status: DC
  Filled 2013-12-09: qty 2

## 2013-12-09 MED ORDER — SPIRONOLACTONE 50 MG PO TABS
50.0000 mg | ORAL_TABLET | Freq: Every day | ORAL | Status: DC
  Administered 2013-12-10 – 2013-12-12 (×2): 50 mg via ORAL
  Filled 2013-12-09 (×4): qty 1

## 2013-12-09 MED ORDER — VITAMIN C 500 MG PO TABS
500.0000 mg | ORAL_TABLET | Freq: Every day | ORAL | Status: DC
  Administered 2013-12-10 – 2013-12-12 (×3): 500 mg via ORAL
  Filled 2013-12-09 (×3): qty 1

## 2013-12-09 MED ORDER — OLANZAPINE 5 MG PO TBDP
7.5000 mg | ORAL_TABLET | Freq: Every day | ORAL | Status: DC
  Administered 2013-12-10 – 2013-12-11 (×3): 7.5 mg via ORAL
  Filled 2013-12-09 (×3): qty 1.5

## 2013-12-09 MED ORDER — IBUPROFEN 200 MG PO TABS: 600.0000 mg | ORAL_TABLET | Freq: Three times a day (TID) | ORAL | Status: DC | PRN

## 2013-12-09 MED ORDER — TEMAZEPAM 15 MG PO CAPS: 15.0000 mg | ORAL_CAPSULE | Freq: Every day | ORAL | Status: DC

## 2013-12-09 MED ORDER — ZIPRASIDONE MESYLATE 20 MG IM SOLR
10.0000 mg | Freq: Once | INTRAMUSCULAR | Status: AC
  Administered 2013-12-09: 10 mg via INTRAMUSCULAR
  Filled 2013-12-09: qty 20

## 2013-12-09 MED ORDER — LORAZEPAM 1 MG PO TABS
1.0000 mg | ORAL_TABLET | Freq: Three times a day (TID) | ORAL | Status: DC | PRN
  Administered 2013-12-10 – 2013-12-12 (×2): 1 mg via ORAL
  Filled 2013-12-09 (×3): qty 1

## 2013-12-09 MED ORDER — LORAZEPAM 0.5 MG PO TABS: 0.5000 mg | ORAL_TABLET | Freq: Two times a day (BID) | ORAL | Status: DC | PRN

## 2013-12-09 MED ORDER — LAMOTRIGINE 100 MG PO TABS
100.0000 mg | ORAL_TABLET | Freq: Every day | ORAL | Status: DC
  Administered 2013-12-10 – 2013-12-11 (×3): 100 mg via ORAL
  Filled 2013-12-09 (×6): qty 1

## 2013-12-09 MED ORDER — TADALAFIL (PAH) 20 MG PO TABS: 20.0000 mg | ORAL_TABLET | Freq: Every day | ORAL | Status: DC

## 2013-12-09 MED ORDER — FUROSEMIDE 40 MG PO TABS: 20.0000 mg | ORAL_TABLET | Freq: Every day | ORAL | Status: DC | PRN

## 2013-12-09 MED ORDER — WARFARIN SODIUM 7.5 MG PO TABS: 7.5000 mg | ORAL_TABLET | Freq: Every day | ORAL | Status: DC

## 2013-12-09 MED ORDER — OLANZAPINE 5 MG PO TABS: 7.5000 mg | ORAL_TABLET | Freq: Every day | ORAL | Status: DC

## 2013-12-09 MED ORDER — LEVOTHYROXINE SODIUM 112 MCG PO TABS
112.0000 ug | ORAL_TABLET | Freq: Every day | ORAL | Status: DC
  Administered 2013-12-10 – 2013-12-12 (×3): 112 ug via ORAL
  Filled 2013-12-09 (×6): qty 1

## 2013-12-09 MED ORDER — WARFARIN - PHARMACIST DOSING INPATIENT: Freq: Every day | Status: DC

## 2013-12-09 MED ORDER — ZOLPIDEM TARTRATE 5 MG PO TABS
5.0000 mg | ORAL_TABLET | Freq: Every day | ORAL | Status: DC
  Administered 2013-12-09 – 2013-12-11 (×3): 5 mg via ORAL
  Filled 2013-12-09 (×3): qty 1

## 2013-12-09 MED ORDER — ACETAMINOPHEN 325 MG PO TABS: 650.0000 mg | ORAL_TABLET | ORAL | Status: DC | PRN

## 2013-12-09 MED ORDER — ALUM & MAG HYDROXIDE-SIMETH 200-200-20 MG/5ML PO SUSP
30.0000 mL | ORAL | Status: DC | PRN
  Administered 2013-12-11: 30 mL via ORAL
  Filled 2013-12-09: qty 30

## 2013-12-09 NOTE — ED Notes (Signed)
Pt walking in hall. Attempt to reorient pt. Pt cussing at staff. Pt threw lunch tray toward staff.

## 2013-12-09 NOTE — ED Notes (Signed)
Pt cooperated with med administration and lab drawl

## 2013-12-09 NOTE — Progress Notes (Addendum)
ANTICOAGULATION CONSULT NOTE - Initial Consult  Pharmacy Consult for Warfarin Indication: hx PE, severe chronic thromboembolic PAH   No Known Allergies  Patient Measurements:   Heparin Dosing Weight:   Vital Signs: Temp: 98.2 F (36.8 C) (04/11 0948) Temp src: Oral (04/11 0948) BP: 149/92 mmHg (04/11 1234) Pulse Rate: 72 (04/11 1234)  Labs:  Recent Labs  12/09/13 1037  HGB 13.8  HCT 41.4  PLT 208  CREATININE 1.00    The CrCl is unknown because both a height and weight (above a minimum accepted value) are required for this calculation.   Medical History: Past Medical History  Diagnosis Date  . Dyspnea   . Pulmonary embolism   . Breast cyst     right  . Hypothyroidism   . Renal cyst   . Leukocytopenia   . Monoclonal gammopathy   . Osteoarthritis   . Schizoaffective disorder   . Wears dentures   . Pulmonary hypertension    Assessment: Michaela Horton on chronic warfarin for hx PE and thromboembolic PAH admitted 6/73 for agitation and decompensated schizophrenia.  Pharmacy consulted to resume warfarin dosing inpatient.   Home dose: Warf 7.5mg  daily LD 4/10 @ 1600  4/11:  CBC WNL, no issues noted Baseline PT/INR pending  Goal of Therapy:  INR 2-3 Monitor platelets by anticoagulation protocol: Yes   Plan:  Await results of PT/INR before scheduling warfarin dose tonight  Ralene Bathe, PharmD, BCPS 12/09/2013, 3:28 PM  Pager: [phone]  ADDENDUM:  INR 1.62 - subtherapeutic.  Will dose warfarin 10mg  po x 1 tonight.   Ralene Bathe, PharmD, BCPS 12/09/2013, 4:55 PM  Pager: [phone]

## 2013-12-09 NOTE — Consult Note (Signed)
[place] [facility] Consult   Reason for Consult:  Aggression, bizarre behaviors--bipolar disorder Referring Physician:  ED MD  Michaela Horton is an 73 y.o. female. Total Time spent with patient: 30 minutes  Assessment: AXIS I:  Anxiety Disorder NOS and Bipolar, mixed AXIS II:  Deferred AXIS III:   Past Medical History  Diagnosis Date  . Dyspnea   . Pulmonary embolism   . Breast cyst     right  . Hypothyroidism   . Renal cyst   . Leukocytopenia   . Monoclonal gammopathy   . Osteoarthritis   . Schizoaffective disorder   . Wears dentures   . Pulmonary hypertension    AXIS IV:  other psychosocial or environmental problems, problems related to social environment and problems with primary support group AXIS V:  21-30 behavior considerably influenced by delusions or hallucinations OR serious impairment in judgment, communication OR inability to function in almost all areas  Plan:  Recommend psychiatric Inpatient admission when medically cleared.  Dr. Sabra Heck assessed and concurs with the treatment plan to transfer the patient to [facility] when bed available.  Subjective:   Michaela Horton is a 73 y.o. female patient admitted with bipolar disorder.  HPI:  Patient went to the beauty shop this morning and became agitated with bizarre behavior.  The police brought her to the ED.  Michaela Horton has a history of bipolar disorder and is cared for by her daughter during the day.  She just left [facility] on Monday but started refusing her medications.  Her daughter took her to her psychiatrist on Wednesday who changed her medications back to what she was on prior to hospitalization--Zyprexa, Lamictal, and Temazepam.  The patient took these pills except the Zyprexa.  She became upset yesterday when she was served papers from another daughter to declare her incompetent.   HPI Elements:   Location:  generalized. Quality:  acute. Severity:  severe. Timing:   constant. Duration:  few days. Context:  refused antipsychotic medication.  Past Psychiatric History: Past Medical History  Diagnosis Date  . Dyspnea   . Pulmonary embolism   . Breast cyst     right  . Hypothyroidism   . Renal cyst   . Leukocytopenia   . Monoclonal gammopathy   . Osteoarthritis   . Schizoaffective disorder   . Wears dentures   . Pulmonary hypertension     reports that she has never smoked. She has never used smokeless tobacco. She reports that she does not drink alcohol or use illicit drugs. Family History  Problem Relation Age of Onset  . Cancer Father     PROSTATE  . Atrial fibrillation Mother   . Alzheimer's disease Mother            Allergies:  No Known Allergies  ACT Assessment Complete:  Yes:    Educational Status    Risk to Self: Risk to self Is patient at risk for suicide?: No Substance abuse history and/or treatment for substance abuse?: No  Risk to Others:    Abuse:    Prior Inpatient Therapy:    Prior Outpatient Therapy:    Additional Information:                    Objective: Blood pressure 149/92, pulse 72, temperature 98.2 F (36.8 C), temperature source Oral, resp. rate 18, SpO2 91.00%.There is no weight on file to calculate BMI. Results for orders placed during the hospital encounter of 12/09/13 (from the past  72 hour(s))  CBC     Status: Abnormal   Collection Time    12/09/13 10:37 AM      Result Value Ref Range   WBC 3.3 (*) 4.0 - 10.5 K/uL   RBC 4.40  3.87 - 5.11 MIL/uL   Hemoglobin 13.8  12.0 - 15.0 g/dL   HCT 41.4  36.0 - 46.0 %   MCV 94.1  78.0 - 100.0 fL   MCH 31.4  26.0 - 34.0 pg   MCHC 33.3  30.0 - 36.0 g/dL   RDW 14.0  11.5 - 15.5 %   Platelets 208  150 - 400 K/uL  COMPREHENSIVE METABOLIC PANEL     Status: Abnormal   Collection Time    12/09/13 10:37 AM      Result Value Ref Range   Sodium 141  137 - 147 mEq/L   Potassium 4.1  3.7 - 5.3 mEq/L   Chloride 106  96 - 112 mEq/L   CO2 25  19 - 32  mEq/L   Glucose, Bld 98  70 - 99 mg/dL   BUN 13  6 - 23 mg/dL   Creatinine, Ser 1.00  0.50 - 1.10 mg/dL   Calcium 9.5  8.4 - 10.5 mg/dL   Total Protein 7.3  6.0 - 8.3 g/dL   Albumin 3.1 (*) 3.5 - 5.2 g/dL   AST 17  0 - 37 U/L   ALT 16  0 - 35 U/L   Alkaline Phosphatase 51  39 - 117 U/L   Total Bilirubin 0.3  0.3 - 1.2 mg/dL   GFR calc non Af Amer 55 (*) >90 mL/min   GFR calc Af Amer 64 (*) >90 mL/min   Comment: (NOTE)     The eGFR has been calculated using the CKD EPI equation.     This calculation has not been validated in all clinical situations.     eGFR's persistently <90 mL/min signify possible Chronic Kidney     Disease.  ETHANOL     Status: None   Collection Time    12/09/13 10:37 AM      Result Value Ref Range   Alcohol, Ethyl (B) <11  0 - 11 mg/dL   Comment:            LOWEST DETECTABLE LIMIT FOR     SERUM ALCOHOL IS 11 mg/dL     FOR MEDICAL PURPOSES ONLY  ACETAMINOPHEN LEVEL     Status: None   Collection Time    12/09/13 10:37 AM      Result Value Ref Range   Acetaminophen (Tylenol), Serum <15.0  10 - 30 ug/mL   Comment:            THERAPEUTIC CONCENTRATIONS VARY     SIGNIFICANTLY. A RANGE OF 10-30     ug/mL MAY BE AN EFFECTIVE     CONCENTRATION FOR MANY PATIENTS.     HOWEVER, SOME ARE BEST TREATED     AT CONCENTRATIONS OUTSIDE THIS     RANGE.     ACETAMINOPHEN CONCENTRATIONS     >150 ug/mL AT 4 HOURS AFTER     INGESTION AND >50 ug/mL AT 12     HOURS AFTER INGESTION ARE     OFTEN ASSOCIATED WITH TOXIC     REACTIONS.  SALICYLATE LEVEL     Status: Abnormal   Collection Time    12/09/13 10:37 AM      Result Value Ref Range   Salicylate Lvl <7.8 (*) 2.8 -  20.0 mg/dL  URINE RAPID DRUG SCREEN (HOSP PERFORMED)     Status: None   Collection Time    12/09/13 12:31 PM      Result Value Ref Range   Opiates NONE DETECTED  NONE DETECTED   Cocaine NONE DETECTED  NONE DETECTED   Benzodiazepines NONE DETECTED  NONE DETECTED   Amphetamines NONE DETECTED  NONE  DETECTED   Tetrahydrocannabinol NONE DETECTED  NONE DETECTED   Barbiturates NONE DETECTED  NONE DETECTED   Comment:            DRUG SCREEN FOR MEDICAL PURPOSES     ONLY.  IF CONFIRMATION IS NEEDED     FOR ANY PURPOSE, NOTIFY LAB     WITHIN 5 DAYS.                LOWEST DETECTABLE LIMITS     FOR URINE DRUG SCREEN     Drug Class       Cutoff (ng/mL)     Amphetamine      1000     Barbiturate      200     Benzodiazepine   562     Tricyclics       130     Opiates          300     Cocaine          300     THC              50  URINALYSIS, ROUTINE W REFLEX MICROSCOPIC     Status: None   Collection Time    12/09/13 12:33 PM      Result Value Ref Range   Color, Urine YELLOW  YELLOW   APPearance CLEAR  CLEAR   Specific Gravity, Urine 1.008  1.005 - 1.030   pH 7.5  5.0 - 8.0   Glucose, UA NEGATIVE  NEGATIVE mg/dL   Hgb urine dipstick NEGATIVE  NEGATIVE   Bilirubin Urine NEGATIVE  NEGATIVE   Ketones, ur NEGATIVE  NEGATIVE mg/dL   Protein, ur NEGATIVE  NEGATIVE mg/dL   Urobilinogen, UA 0.2  0.0 - 1.0 mg/dL   Nitrite NEGATIVE  NEGATIVE   Leukocytes, UA NEGATIVE  NEGATIVE   Comment: MICROSCOPIC NOT DONE ON URINES WITH NEGATIVE PROTEIN, BLOOD, LEUKOCYTES, NITRITE, OR GLUCOSE <1000 mg/dL.   Labs are reviewed and are pertinent for medical issues being addressed.  Current Facility-Administered Medications  Medication Dose Route Frequency Provider Last Rate Last Dose  . acetaminophen (TYLENOL) tablet 650 mg  650 mg Oral Q4H PRN Osvaldo Shipper, MD      . alum & mag hydroxide-simeth (MAALOX/MYLANTA) 200-200-20 MG/5ML suspension 30 mL  30 mL Oral PRN Osvaldo Shipper, MD      . furosemide (LASIX) tablet 20 mg  20 mg Oral Daily PRN Osvaldo Shipper, MD      . ibuprofen (ADVIL,MOTRIN) tablet 600 mg  600 mg Oral Q8H PRN Osvaldo Shipper, MD      . lamoTRIgine (LAMICTAL) tablet 100 mg  100 mg Oral QHS Osvaldo Shipper, MD      . Derrill Memo ON 12/10/2013] levothyroxine (SYNTHROID,  LEVOTHROID) tablet 112 mcg  112 mcg Oral QAC breakfast Osvaldo Shipper, MD      . LORazepam (ATIVAN) tablet 1 mg  1 mg Oral Q8H PRN Osvaldo Shipper, MD      . LORazepam (ATIVAN) tablet 1 mg  1 mg Oral BID PRN Osvaldo Shipper, MD      .  OLANZapine zydis (ZYPREXA) disintegrating tablet 7.5 mg  7.5 mg Oral QHS Waylan Boga, NP      . ondansetron [facility]) tablet 4 mg  4 mg Oral Q8H PRN Osvaldo Shipper, MD      . spironolactone (ALDACTONE) tablet 50 mg  50 mg Oral Daily Osvaldo Shipper, MD      . Tadalafil (PAH) TABS 20 mg  20 mg Oral Daily Osvaldo Shipper, MD      . temazepam (RESTORIL) capsule 15 mg  15 mg Oral QHS Osvaldo Shipper, MD      . Warfarin - Pharmacist Dosing Inpatient   Does not apply G4010 Osvaldo Shipper, MD       Current Outpatient Prescriptions  Medication Sig Dispense Refill  . B Complex-C (B-COMPLEX WITH VITAMIN C) tablet Take 1 tablet by mouth daily.       . calcium-vitamin D (OSCAL WITH D) 500-200 MG-UNIT per tablet Take 1 tablet by mouth 2 (two) times daily.       . furosemide (LASIX) 20 MG tablet Take 20 mg by mouth daily as needed for fluid.       Marland Kitchen lamoTRIgine (LAMICTAL) 100 MG tablet Take 100 mg by mouth at bedtime.       Marland Kitchen levothyroxine (SYNTHROID, LEVOTHROID) 112 MCG tablet Take 112 mcg by mouth daily before breakfast.       . LORazepam (ATIVAN) 1 MG tablet Take 1 tablet by mouth 2 (two) times daily as needed for anxiety or sleep.       . Multiple Vitamin (MULTIVITAMIN WITH MINERALS) TABS tablet Take 1 tablet by mouth every morning.      Marland Kitchen OLANZapine (ZYPREXA) 7.5 MG tablet Take 7.5 mg by mouth at bedtime.      Marland Kitchen spironolactone (ALDACTONE) 50 MG tablet Take 50 mg by mouth daily.       . Tadalafil, PAH, (ADCIRCA) 20 MG TABS Take 20 mg by mouth daily.      . temazepam (RESTORIL) 15 MG capsule Take 15 mg by mouth at bedtime.       . vitamin C (ASCORBIC ACID) 500 MG tablet Take 500 mg by mouth daily.       Marland Kitchen warfarin (COUMADIN) 7.5  MG tablet Take 7.5 mg by mouth daily.        Psychiatric Specialty Exam:     Blood pressure 149/92, pulse 72, temperature 98.2 F (36.8 C), temperature source Oral, resp. rate 18, SpO2 91.00%.There is no weight on file to calculate BMI.  General Appearance: Casual  Eye Contact::  Fair  Speech:  Slow  Volume:  Normal  Mood:  Anxious and Irritable  Affect:  Congruent  Thought Process:  Disorganized  Orientation:  Full (Time, Place, and Person)  Thought Content:  Negative  Suicidal Thoughts:  No  Homicidal Thoughts:  No  Memory:  Immediate;   Fair Recent;   Fair Remote;   Fair  Judgement:  Impaired  Insight:  Lacking  Psychomotor Activity:  Decreased  Concentration:  Fair  Recall:  [employer] of Knowledge:Good  Language: Good  Akathisia:  No  Handed:  Right  AIMS (if indicated):     Assets:  Financial Resources/Insurance Housing Leisure Time Resilience Social Support  Sleep:      Musculoskeletal: Strength & Muscle Tone: within normal limits Gait & Station: normal Patient leans: N/A  Treatment Plan Summary: Admit to geropsychiatry at [facility] very involved and needs to be involved with decision making processes.  Zyprexa  zydis replacing the Zyprexa with the hopes of her taking it, tapering off benzodiazepines.  Waylan Boga, PMH-NP 12/09/2013 4:19 PM Personally evaluated the patient, met with family member and participated in the assessment and plan Geralyn Flash A. Sabra Heck, M.D.

## 2013-12-09 NOTE — ED Notes (Addendum)
Pt reports usually has O2 sat of 88%.   Pt asked if she remembers why she was brought in. Pt states "the girl told me I didn't have a car and said I was going to be a 73 years old little girl. Was that your little 73 year old little girl?" Pt begins to laugh. Pt reoriented to situation.    Pt encouraged to void when able for urine sample.

## 2013-12-09 NOTE — ED Notes (Addendum)
Patient's daughter took all of belongings with her

## 2013-12-09 NOTE — ED Notes (Signed)
Patients belongings include: One white colored watch 2 white colored rings- one with white stones and one with tiny white stones and one large yellow colored stone One white colored cross necklace with yellow heart Two yellow colored hoop earrings 1 post earring with pearl like stone 1 yellow colored necklace that says "[place]" Black jeans, Black and white shirt White bra 9 West pocketbook Gray cell phone(not smart phone) 2 glasses cases(one with sunglasses, one empty) Silver color bracelet Blue checkbook [place] agenda book Black wallet Chase card,[employer] card,[employer] card,[employer],credit union, [employer] gift card $25, [employer] card, Receipts, NO CASH, change equalling less that [employer] black sandals

## 2013-12-09 NOTE — ED Notes (Addendum)
Pt eating dinner at present time.   Daughter at bedside. Toni Chamberlain/daughter cell phone [phone] and home phone [phone].   Per daughter will bring pulmonary hypertension medication tonight.

## 2013-12-09 NOTE — ED Notes (Signed)
Pt continues to lie in bed cursing at staff.

## 2013-12-09 NOTE — Progress Notes (Signed)
Pt's referral has been faxed to the following facilities with bed availability:  [facility]- low acuity beds available [facility]- per Juliann Pulse some possible d/c's today [facility]- per [facility]- per [employer] available [facility]- per [employer] [employer] available [facility]- per [name] beds available [facility] per [place] can fax   [facility] Disposition [facility]
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
## 2013-12-09 NOTE — ED Notes (Signed)
Family requesting that pt gets her lunch when she wakes up.

## 2013-12-09 NOTE — ED Notes (Signed)
Patient gave verbal permission x2 to give daughter keys. Keys taken to daughter by registration

## 2013-12-09 NOTE — ED Notes (Signed)
Pt redirected to room. Pt given GEODON. See Mar. Pt repositioned in bed with warm blankets.

## 2013-12-09 NOTE — ED Notes (Signed)
Classical Music used to calm patient

## 2013-12-09 NOTE — ED Notes (Signed)
I attempted to draw blood x2. Unsuccessful x2.RN Marzetta Board notified and Orma Flaming NT notified

## 2013-12-09 NOTE — ED Notes (Signed)
Bed: WA17 Expected date:  Expected time:  Means of arrival:  Comments: 

## 2013-12-09 NOTE — ED Notes (Signed)
Patient compliant with meds-daughter at bedside-fluids offered-patient easily agitated with daughter

## 2013-12-09 NOTE — ED Notes (Addendum)
Pt will not allow staff to place oxygen on. Per request classical music playing for pt. Pt quoting religious scripture at present time.  Pt reoriented and repositioned in bed for comfort.

## 2013-12-09 NOTE — ED Notes (Signed)
Pt walking rapidly in hall cursing and yelling at staff. Pt states "where is my mom?" Pt redirected to room.

## 2013-12-10 ENCOUNTER — Encounter (HOSPITAL_COMMUNITY): Payer: Self-pay | Admitting: Registered Nurse

## 2013-12-10 LAB — PROTIME-INR
INR: 1.74 — AB (ref 0.00–1.49)
PROTHROMBIN TIME: 19.8 s — AB (ref 11.6–15.2)

## 2013-12-10 MED ORDER — OLANZAPINE 10 MG IM SOLR: 5.0000 mg | Freq: Every day | INTRAMUSCULAR | Status: DC | PRN

## 2013-12-10 MED ORDER — WARFARIN SODIUM 7.5 MG PO TABS
7.5000 mg | ORAL_TABLET | Freq: Once | ORAL | Status: AC
  Administered 2013-12-10: 7.5 mg via ORAL
  Filled 2013-12-10: qty 1

## 2013-12-10 NOTE — ED Notes (Signed)
Bed: WA18 Expected date:  Expected time:  Means of arrival:  Comments: Hold 

## 2013-12-10 NOTE — Progress Notes (Signed)
Pt has been declined by [facility] per [place] d/t their unit acuity.   Rick Duff Disposition MHT

## 2013-12-10 NOTE — ED Notes (Signed)
Pt agitated, refused medication by throwing it across the room.  RN continuing to monitor.

## 2013-12-10 NOTE — Progress Notes (Signed)
ANTICOAGULATION CONSULT NOTE - Follow Up Consult  Pharmacy Consult for Warfarin Indication: Hx PE, severe chronic thromboembolic PAH  No Known Allergies  Patient Measurements:   TBW 71 kg 2/15  Vital Signs: Temp: 97.7 F (36.5 C) (04/12 1225) Temp src: Oral (04/12 1225) BP: 145/85 mmHg (04/12 1225) Pulse Rate: 105 (04/12 1225)  Labs:  Recent Labs  12/09/13 1037 12/09/13 1556 12/10/13 1442  HGB 13.8  --   --   HCT 41.4  --   --   PLT 208  --   --   LABPROT  --  18.8* 19.8*  INR  --  1.62* 1.74*  CREATININE 1.00  --   --    Medications:  Scheduled:  . calcium-vitamin D  1 tablet Oral Q breakfast  . lamoTRIgine  100 mg Oral QHS  . levothyroxine  112 mcg Oral QAC breakfast  . OLANZapine zydis  7.5 mg Oral QHS  . spironolactone  50 mg Oral Daily  . Tadalafil (PAH)  40 mg Oral Daily  . vitamin C  500 mg Oral Daily  . Warfarin - Pharmacist Dosing Inpatient   Does not apply q1800  . zolpidem  5 mg Oral QHS   Assessment: 16 yoF on chronic warfarin for hx PE and thromboembolic PAH admitted 4/09 for agitation and decompensated schizophrenia.  Pharmacy consulted to resume warfarin dosing inpatient. Home dose: Warfarin 7.5mg  daily with last dose 4/10 @ 1600.  Admit INR 1.62- Warfarin 10mg  given  Patient agitated, required Haldol injection last night, appears calm with daughter  INR today 1.74, regular diet ordered-intake unknown  Goal of Therapy:  INR 2-3   Plan:   Warfarin 7.5mg  tonight  Daily PT/INR ordered starting tomorrow  Minda Ditto PharmD Pager [phone] 12/10/2013, 5:40 PM

## 2013-12-10 NOTE — Progress Notes (Signed)
Per Pamala Hurry at [place] pt has been declined d/t aggression.   Rick Duff Disposition MHT

## 2013-12-10 NOTE — ED Notes (Addendum)
Classical music playing for comfort of the patient.

## 2013-12-10 NOTE — ED Notes (Signed)
Pt ambulated to restroom with steady gait.

## 2013-12-10 NOTE — ED Notes (Signed)
Pt refused to vital signs taken.

## 2013-12-10 NOTE — ED Notes (Signed)
Pt offered breakfast meal and it was refused

## 2013-12-10 NOTE — Consult Note (Signed)
[place] [facility] Consult   Reason for Consult:  Aggression, bizarre behaviors--bipolar disorder Referring Physician:  ED MD  Michaela Horton is an 73 y.o. female. Total Time spent with patient: 20 minutes  Assessment: AXIS I:  Anxiety Disorder NOS and Bipolar, mixed AXIS II:  Deferred AXIS III:   Past Medical History  Diagnosis Date  . Dyspnea   . Pulmonary embolism   . Breast cyst     right  . Hypothyroidism   . Renal cyst   . Leukocytopenia   . Monoclonal gammopathy   . Osteoarthritis   . Schizoaffective disorder   . Wears dentures   . Pulmonary hypertension    AXIS IV:  other psychosocial or environmental problems, problems related to social environment and problems with primary support group AXIS V:  21-30 behavior considerably influenced by delusions or hallucinations OR serious impairment in judgment, communication OR inability to function in almost all areas  Plan:  Recommend psychiatric Inpatient admission when medically cleared.  Dr. Sabra Heck assessed and concurs with the treatment plan to transfer the patient to [facility] when bed available.  Subjective:   Michaela Horton is a 73 y.o. female patient admitted with bipolar disorder.  HPI:  Patient continues to be very agitated this morning.  Patient has been refusing to take her medications.  Patient is disorganized jumping from subject to subject and answers to questions.  Patient was tearful when talking about beauty shop and then angry when she was asked what happened.  Patient states that she is not going to take her medications because they don't  Help.  Patient then began to talk about a grandson that died asking interviews if we knew the name of grandson.  And then started to yell "I can't stand your daddy damit.    HPI Elements:   Location:  generalized. Quality:  acute. Severity:  severe. Timing:  constant. Duration:  few days. Context:  refused antipsychotic medication.  Past  Psychiatric History: Past Medical History  Diagnosis Date  . Dyspnea   . Pulmonary embolism   . Breast cyst     right  . Hypothyroidism   . Renal cyst   . Leukocytopenia   . Monoclonal gammopathy   . Osteoarthritis   . Schizoaffective disorder   . Wears dentures   . Pulmonary hypertension     reports that she has never smoked. She has never used smokeless tobacco. She reports that she does not drink alcohol or use illicit drugs. Family History  Problem Relation Age of Onset  . Cancer Father     PROSTATE  . Atrial fibrillation Mother   . Alzheimer's disease Mother            Allergies:  No Known Allergies  ACT Assessment Complete:  Yes:    Educational Status    Risk to Self: Risk to self Is patient at risk for suicide?: No Substance abuse history and/or treatment for substance abuse?: No  Risk to Others:    Abuse:    Prior Inpatient Therapy:    Prior Outpatient Therapy:    Additional Information:     Objective: Blood pressure 145/85, pulse 105, temperature 97.7 F (36.5 C), temperature source Oral, resp. rate 18, SpO2 90.00%.There is no weight on file to calculate BMI. Results for orders placed during the hospital encounter of 12/09/13 (from the past 72 hour(s))  CBC     Status: Abnormal   Collection Time    12/09/13 10:37 AM  Result Value Ref Range   WBC 3.3 (*) 4.0 - 10.5 K/uL   RBC 4.40  3.87 - 5.11 MIL/uL   Hemoglobin 13.8  12.0 - 15.0 g/dL   HCT 41.4  36.0 - 46.0 %   MCV 94.1  78.0 - 100.0 fL   MCH 31.4  26.0 - 34.0 pg   MCHC 33.3  30.0 - 36.0 g/dL   RDW 14.0  11.5 - 15.5 %   Platelets 208  150 - 400 K/uL  COMPREHENSIVE METABOLIC PANEL     Status: Abnormal   Collection Time    12/09/13 10:37 AM      Result Value Ref Range   Sodium 141  137 - 147 mEq/L   Potassium 4.1  3.7 - 5.3 mEq/L   Chloride 106  96 - 112 mEq/L   CO2 25  19 - 32 mEq/L   Glucose, Bld 98  70 - 99 mg/dL   BUN 13  6 - 23 mg/dL   Creatinine, Ser 1.00  0.50 - 1.10 mg/dL    Calcium 9.5  8.4 - 10.5 mg/dL   Total Protein 7.3  6.0 - 8.3 g/dL   Albumin 3.1 (*) 3.5 - 5.2 g/dL   AST 17  0 - 37 U/L   ALT 16  0 - 35 U/L   Alkaline Phosphatase 51  39 - 117 U/L   Total Bilirubin 0.3  0.3 - 1.2 mg/dL   GFR calc non Af Amer 55 (*) >90 mL/min   GFR calc Af Amer 64 (*) >90 mL/min   Comment: (NOTE)     The eGFR has been calculated using the CKD EPI equation.     This calculation has not been validated in all clinical situations.     eGFR's persistently <90 mL/min signify possible Chronic Kidney     Disease.  ETHANOL     Status: None   Collection Time    12/09/13 10:37 AM      Result Value Ref Range   Alcohol, Ethyl (B) <11  0 - 11 mg/dL   Comment:            LOWEST DETECTABLE LIMIT FOR     SERUM ALCOHOL IS 11 mg/dL     FOR MEDICAL PURPOSES ONLY  ACETAMINOPHEN LEVEL     Status: None   Collection Time    12/09/13 10:37 AM      Result Value Ref Range   Acetaminophen (Tylenol), Serum <15.0  10 - 30 ug/mL   Comment:            THERAPEUTIC CONCENTRATIONS VARY     SIGNIFICANTLY. A RANGE OF 10-30     ug/mL MAY BE AN EFFECTIVE     CONCENTRATION FOR MANY PATIENTS.     HOWEVER, SOME ARE BEST TREATED     AT CONCENTRATIONS OUTSIDE THIS     RANGE.     ACETAMINOPHEN CONCENTRATIONS     >150 ug/mL AT 4 HOURS AFTER     INGESTION AND >50 ug/mL AT 12     HOURS AFTER INGESTION ARE     OFTEN ASSOCIATED WITH TOXIC     REACTIONS.  SALICYLATE LEVEL     Status: Abnormal   Collection Time    12/09/13 10:37 AM      Result Value Ref Range   Salicylate Lvl <6.5 (*) 2.8 - 20.0 mg/dL  URINE RAPID DRUG SCREEN (HOSP PERFORMED)     Status: None   Collection Time    12/09/13 12:31  PM      Result Value Ref Range   Opiates NONE DETECTED  NONE DETECTED   Cocaine NONE DETECTED  NONE DETECTED   Benzodiazepines NONE DETECTED  NONE DETECTED   Amphetamines NONE DETECTED  NONE DETECTED   Tetrahydrocannabinol NONE DETECTED  NONE DETECTED   Barbiturates NONE DETECTED  NONE DETECTED    Comment:            DRUG SCREEN FOR MEDICAL PURPOSES     ONLY.  IF CONFIRMATION IS NEEDED     FOR ANY PURPOSE, NOTIFY LAB     WITHIN 5 DAYS.                LOWEST DETECTABLE LIMITS     FOR URINE DRUG SCREEN     Drug Class       Cutoff (ng/mL)     Amphetamine      1000     Barbiturate      200     Benzodiazepine   923     Tricyclics       300     Opiates          300     Cocaine          300     THC              50  URINALYSIS, ROUTINE W REFLEX MICROSCOPIC     Status: None   Collection Time    12/09/13 12:33 PM      Result Value Ref Range   Color, Urine YELLOW  YELLOW   APPearance CLEAR  CLEAR   Specific Gravity, Urine 1.008  1.005 - 1.030   pH 7.5  5.0 - 8.0   Glucose, UA NEGATIVE  NEGATIVE mg/dL   Hgb urine dipstick NEGATIVE  NEGATIVE   Bilirubin Urine NEGATIVE  NEGATIVE   Ketones, ur NEGATIVE  NEGATIVE mg/dL   Protein, ur NEGATIVE  NEGATIVE mg/dL   Urobilinogen, UA 0.2  0.0 - 1.0 mg/dL   Nitrite NEGATIVE  NEGATIVE   Leukocytes, UA NEGATIVE  NEGATIVE   Comment: MICROSCOPIC NOT DONE ON URINES WITH NEGATIVE PROTEIN, BLOOD, LEUKOCYTES, NITRITE, OR GLUCOSE <1000 mg/dL.  PROTIME-INR     Status: Abnormal   Collection Time    12/09/13  3:56 PM      Result Value Ref Range   Prothrombin Time 18.8 (*) 11.6 - 15.2 seconds   INR 1.62 (*) 0.00 - 1.49   Labs are reviewed and are pertinent for medical issues being addressed.  Zyprexa 5 mg added today and patient is to be given it only if she refused her Zyprexa Zydis at bed time  Current Facility-Administered Medications  Medication Dose Route Frequency Provider Last Rate Last Dose  . acetaminophen (TYLENOL) tablet 650 mg  650 mg Oral Q4H PRN Osvaldo Shipper, MD      . alum & mag hydroxide-simeth (MAALOX/MYLANTA) 200-200-20 MG/5ML suspension 30 mL  30 mL Oral PRN Osvaldo Shipper, MD      . calcium-vitamin D (OSCAL WITH D) 500-200 MG-UNIT per tablet 1 tablet  1 tablet Oral Q breakfast Waylan Boga, NP      . furosemide  (LASIX) tablet 20 mg  20 mg Oral Daily PRN Osvaldo Shipper, MD      . ibuprofen (ADVIL,MOTRIN) tablet 600 mg  600 mg Oral Q8H PRN Osvaldo Shipper, MD      . lamoTRIgine (LAMICTAL) tablet 100 mg  100 mg Oral QHS  Osvaldo Shipper, MD   100 mg at 12/10/13 0131  . levothyroxine (SYNTHROID, LEVOTHROID) tablet 112 mcg  112 mcg Oral QAC breakfast Osvaldo Shipper, MD      . LORazepam (ATIVAN) tablet 1 mg  1 mg Oral Q8H PRN Osvaldo Shipper, MD   1 mg at 12/10/13 1204  . OLANZapine (ZYPREXA) injection 5 mg  5 mg Intramuscular Daily PRN Shuvon Rankin, NP      . OLANZapine zydis (ZYPREXA) disintegrating tablet 7.5 mg  7.5 mg Oral QHS Shuvon Rankin, NP   7.5 mg at 12/10/13 0131  . ondansetron (ZOFRAN) tablet 4 mg  4 mg Oral Q8H PRN Osvaldo Shipper, MD      . spironolactone (ALDACTONE) tablet 50 mg  50 mg Oral Daily Osvaldo Shipper, MD      . Tadalafil (PAH) TABS 40 mg  40 mg Oral Daily [address] Tyler Deis., RPH   40 mg at 12/09/13 2148  . vitamin C (ASCORBIC ACID) tablet 500 mg  500 mg Oral Daily Waylan Boga, NP      . Warfarin - Pharmacist Dosing Inpatient   Does not apply q1800 Osvaldo Shipper, MD      . zolpidem [facility]) tablet 5 mg  5 mg Oral QHS Shea Stakes [place] Tyler Deis., RPH   5 mg at 12/09/13 2148   Current Outpatient Prescriptions  Medication Sig Dispense Refill  . B Complex-C (B-COMPLEX WITH VITAMIN C) tablet Take 1 tablet by mouth daily.       . calcium-vitamin D (OSCAL WITH D) 500-200 MG-UNIT per tablet Take 1 tablet by mouth 2 (two) times daily.       . furosemide (LASIX) 20 MG tablet Take 20 mg by mouth daily as needed for fluid.       Marland Kitchen lamoTRIgine (LAMICTAL) 100 MG tablet Take 100 mg by mouth at bedtime.       Marland Kitchen levothyroxine (SYNTHROID, LEVOTHROID) 112 MCG tablet Take 112 mcg by mouth daily before breakfast.       . LORazepam (ATIVAN) 1 MG tablet Take 1 tablet by mouth 2 (two) times daily as needed for anxiety or sleep.       . Multiple  Vitamin (MULTIVITAMIN WITH MINERALS) TABS tablet Take 1 tablet by mouth every morning.      Marland Kitchen OLANZapine (ZYPREXA) 7.5 MG tablet Take 7.5 mg by mouth at bedtime.      Marland Kitchen spironolactone (ALDACTONE) 50 MG tablet Take 50 mg by mouth daily.       . Tadalafil, PAH, (ADCIRCA) 20 MG TABS Take 40 mg by mouth daily.       . temazepam (RESTORIL) 15 MG capsule Take 15 mg by mouth at bedtime.       . vitamin C (ASCORBIC ACID) 500 MG tablet Take 500 mg by mouth daily.       Marland Kitchen warfarin (COUMADIN) 7.5 MG tablet Take 7.5 mg by mouth daily.        Psychiatric Specialty Exam:     Blood pressure 145/85, pulse 105, temperature 97.7 F (36.5 C), temperature source Oral, resp. rate 18, SpO2 90.00%.There is no weight on file to calculate BMI.  General Appearance: Casual  Eye Contact::  Fair  Speech:  Slow  Volume:  Normal  Mood:  Anxious and Irritable  Affect:  Congruent  Thought Process:  Disorganized  Orientation:  Full (Time, Place, and Person)  Thought Content:  Negative  Suicidal Thoughts:  No  Homicidal Thoughts:  No  Memory:  Immediate;   Fair Recent;   Fair Remote;   Fair  Judgement:  Impaired  Insight:  Lacking  Psychomotor Activity:  Decreased  Concentration:  Fair  Recall:  [employer] of Knowledge:Good  Language: Good  Akathisia:  No  Handed:  Right  AIMS (if indicated):     Assets:  Financial Resources/Insurance Housing Leisure Time Resilience Social Support  Sleep:      Musculoskeletal: Strength & Muscle Tone: within normal limits Gait & Station: normal Patient leans: N/A  Treatment Plan Summary: Admit to geropsychiatry at [facility] very involved and needs to be involved with decision making processes.  Zyprexa zydis replacing the Zyprexa with the hopes of her taking it, tapering off benzodiazepines.  Zyprexa IM 5 mg also added ir patient doesn't take Zyprexa Zydis by mouth.    Disposition:  Will continue looking for placement in geropsychiatry facility.  Patient  to be referred to surrounding inpatient facilities.  Continue to monitor for safety and stabilization until inpatient geropsychiatry bed has been found.    Shuvon Rankin, FNP-BC  12/10/2013 12:52 PM Personally evaluated the patient and participated in the assessment and plan  A. Sabra Heck, M.D.

## 2013-12-10 NOTE — ED Notes (Signed)
Pt refusing to wear Oxygen and refuses to have oxygen saturation checked; pt is noted to not be short of breath; she is alert, and quickly responds to questions.  RN continuing to monitor.

## 2013-12-10 NOTE — ED Notes (Signed)
Pt calm and cooperative. Pt up filling up bath basin to wash up.

## 2013-12-10 NOTE — ED Notes (Signed)
Report given to [place], Therapist, sports for transfer to Rm 18

## 2013-12-10 NOTE — Progress Notes (Signed)
Follow-up calls placed to the following:  [facility]- wait list only [place]- per [facility]- per Karie Kirks have been going through referrals all day, have been backed up but will call as they have been reviewed [facility]- no answer [place]- per Helene Kelp requested TTS to call back [facility] Deutsc[facility]- at capacity per [facility], [place]- at capacity  Pt has been declined at the following: [facility] Disposition [facility]

## 2013-12-10 NOTE — Progress Notes (Signed)
CSW filed APS report with Veterinary surgeon.  Rochele Pages,     ED CSW  phone: [phone] 1:55pm

## 2013-12-10 NOTE — ED Notes (Signed)
Family requesting that the patient be taken off of Lamictal. I have left a note for [facility] NP of Pyshiatry and spoke with Journalist, newspaper.

## 2013-12-10 NOTE — ED Notes (Signed)
Home meds placed in pyxis under patient 2

## 2013-12-10 NOTE — ED Notes (Signed)
Bed: WA27 Expected date:  Expected time:  Means of arrival:  Comments: TCU 

## 2013-12-10 NOTE — ED Notes (Signed)
Dr. Leonides Schanz and pharmacist Lyndee Leo made aware pt refusing all medications; throwing food and medicine across the room.  PT/INR held until pt becomes more agreeable to blood collection per pharmacist, Claire's direction.

## 2013-12-10 NOTE — ED Notes (Signed)
Daughter at bedside. Pt cooperative at this time.

## 2013-12-11 LAB — PROTIME-INR
INR: 2.28 — ABNORMAL HIGH (ref 0.00–1.49)
Prothrombin Time: 24.4 seconds — ABNORMAL HIGH (ref 11.6–15.2)

## 2013-12-11 MED ORDER — WARFARIN SODIUM 7.5 MG PO TABS
7.5000 mg | ORAL_TABLET | Freq: Once | ORAL | Status: AC
  Administered 2013-12-11: 7.5 mg via ORAL
  Filled 2013-12-11: qty 1

## 2013-12-11 NOTE — ED Notes (Signed)
Bed: YO06 Expected date:  Expected time:  Means of arrival:  Comments: [place], roll over in bed to 28

## 2013-12-11 NOTE — Progress Notes (Signed)
CSW followed up with Michaela Horton with Michaela Horton who reports no beds until possible discharges in the morning.     [name], MSW, [place], 12/11/2013 Evening Clinical Social Worker [phone]

## 2013-12-11 NOTE — Consult Note (Addendum)
[facility] Consult   Reason for Consult:  Aggression, bizarre behaviors--bipolar disorder Referring Physician:  ED MD  Ranell Finelli is an 73 y.o. female. Total Time spent with patient: 20 minutes  Assessment: AXIS I:  Anxiety Disorder NOS and Bipolar, mixed AXIS II:  Deferred AXIS III:   Past Medical History  Diagnosis Date  . Dyspnea   . Pulmonary embolism   . Breast cyst     right  . Hypothyroidism   . Renal cyst   . Leukocytopenia   . Monoclonal gammopathy   . Osteoarthritis   . Schizoaffective disorder   . Wears dentures   . Pulmonary hypertension    AXIS IV:  other psychosocial or environmental problems, problems related to social environment and problems with primary support group AXIS V:  46  Plan:  Patient doing somewhat better. Will talk to family for possible discharge home.   Subjective:   Kristi Norment is a 73 y.o. female patient admitted with bipolar disorder.  HPI:  Patient continues to be very agitated this morning.  Patient has been refusing to take her medications.  Patient is disorganized jumping from subject to subject and answers to questions.  Patient was tearful when talking about beauty shop and then angry when she was asked what happened.  Patient today somewhat better and calmer. Talking and conversing but toughts are still disorganized.    HPI Elements:   Location:  generalized. Quality:  acute. Severity:  severe. Timing:  constant. Duration:  few days. Context:  refused antipsychotic medication.  Past Psychiatric History: Past Medical History  Diagnosis Date  . Dyspnea   . Pulmonary embolism   . Breast cyst     right  . Hypothyroidism   . Renal cyst   . Leukocytopenia   . Monoclonal gammopathy   . Osteoarthritis   . Schizoaffective disorder   . Wears dentures   . Pulmonary hypertension     reports that she has never smoked. She has never used smokeless tobacco. She reports that she does not drink alcohol or  use illicit drugs. Family History  Problem Relation Age of Onset  . Cancer Father     PROSTATE  . Atrial fibrillation Mother   . Alzheimer's disease Mother            Allergies:  No Known Allergies  ACT Assessment Complete:  Yes:    Educational Status    Risk to Self: Risk to self Is patient at risk for suicide?: No Substance abuse history and/or treatment for substance abuse?: No  Risk to Others:    Abuse:    Prior Inpatient Therapy:    Prior Outpatient Therapy:    Additional Information:     Objective: Blood pressure 112/86, pulse 72, temperature 98.5 F (36.9 C), temperature source Oral, resp. rate 16, SpO2 94.00%.There is no weight on file to calculate BMI. Results for orders placed during the hospital encounter of 12/09/13 (from the past 72 hour(s))  CBC     Status: Abnormal   Collection Time    12/09/13 10:37 AM      Result Value Ref Range   WBC 3.3 (*) 4.0 - 10.5 K/uL   RBC 4.40  3.87 - 5.11 MIL/uL   Hemoglobin 13.8  12.0 - 15.0 g/dL   HCT 41.4  36.0 - 46.0 %   MCV 94.1  78.0 - 100.0 fL   MCH 31.4  26.0 - 34.0 pg   MCHC 33.3  30.0 - 36.0 g/dL  RDW 14.0  11.5 - 15.5 %   Platelets 208  150 - 400 K/uL  COMPREHENSIVE METABOLIC PANEL     Status: Abnormal   Collection Time    12/09/13 10:37 AM      Result Value Ref Range   Sodium 141  137 - 147 mEq/L   Potassium 4.1  3.7 - 5.3 mEq/L   Chloride 106  96 - 112 mEq/L   CO2 25  19 - 32 mEq/L   Glucose, Bld 98  70 - 99 mg/dL   BUN 13  6 - 23 mg/dL   Creatinine, Ser 1.00  0.50 - 1.10 mg/dL   Calcium 9.5  8.4 - 10.5 mg/dL   Total Protein 7.3  6.0 - 8.3 g/dL   Albumin 3.1 (*) 3.5 - 5.2 g/dL   AST 17  0 - 37 U/L   ALT 16  0 - 35 U/L   Alkaline Phosphatase 51  39 - 117 U/L   Total Bilirubin 0.3  0.3 - 1.2 mg/dL   GFR calc non Af Amer 55 (*) >90 mL/min   GFR calc Af Amer 64 (*) >90 mL/min   Comment: (NOTE)     The eGFR has been calculated using the CKD EPI equation.     This calculation has not been validated in  all clinical situations.     eGFR's persistently <90 mL/min signify possible Chronic Kidney     Disease.  ETHANOL     Status: None   Collection Time    12/09/13 10:37 AM      Result Value Ref Range   Alcohol, Ethyl (B) <11  0 - 11 mg/dL   Comment:            LOWEST DETECTABLE LIMIT FOR     SERUM ALCOHOL IS 11 mg/dL     FOR MEDICAL PURPOSES ONLY  ACETAMINOPHEN LEVEL     Status: None   Collection Time    12/09/13 10:37 AM      Result Value Ref Range   Acetaminophen (Tylenol), Serum <15.0  10 - 30 ug/mL   Comment:            THERAPEUTIC CONCENTRATIONS VARY     SIGNIFICANTLY. A RANGE OF 10-30     ug/mL MAY BE AN EFFECTIVE     CONCENTRATION FOR MANY PATIENTS.     HOWEVER, SOME ARE BEST TREATED     AT CONCENTRATIONS OUTSIDE THIS     RANGE.     ACETAMINOPHEN CONCENTRATIONS     >150 ug/mL AT 4 HOURS AFTER     INGESTION AND >50 ug/mL AT 12     HOURS AFTER INGESTION ARE     OFTEN ASSOCIATED WITH TOXIC     REACTIONS.  SALICYLATE LEVEL     Status: Abnormal   Collection Time    12/09/13 10:37 AM      Result Value Ref Range   Salicylate Lvl <5.9 (*) 2.8 - 20.0 mg/dL  URINE RAPID DRUG SCREEN (HOSP PERFORMED)     Status: None   Collection Time    12/09/13 12:31 PM      Result Value Ref Range   Opiates NONE DETECTED  NONE DETECTED   Cocaine NONE DETECTED  NONE DETECTED   Benzodiazepines NONE DETECTED  NONE DETECTED   Amphetamines NONE DETECTED  NONE DETECTED   Tetrahydrocannabinol NONE DETECTED  NONE DETECTED   Barbiturates NONE DETECTED  NONE DETECTED   Comment:  DRUG SCREEN FOR MEDICAL PURPOSES     ONLY.  IF CONFIRMATION IS NEEDED     FOR ANY PURPOSE, NOTIFY LAB     WITHIN 5 DAYS.                LOWEST DETECTABLE LIMITS     FOR URINE DRUG SCREEN     Drug Class       Cutoff (ng/mL)     Amphetamine      1000     Barbiturate      200     Benzodiazepine   962     Tricyclics       229     Opiates          300     Cocaine          300     THC              50   URINALYSIS, ROUTINE W REFLEX MICROSCOPIC     Status: None   Collection Time    12/09/13 12:33 PM      Result Value Ref Range   Color, Urine YELLOW  YELLOW   APPearance CLEAR  CLEAR   Specific Gravity, Urine 1.008  1.005 - 1.030   pH 7.5  5.0 - 8.0   Glucose, UA NEGATIVE  NEGATIVE mg/dL   Hgb urine dipstick NEGATIVE  NEGATIVE   Bilirubin Urine NEGATIVE  NEGATIVE   Ketones, ur NEGATIVE  NEGATIVE mg/dL   Protein, ur NEGATIVE  NEGATIVE mg/dL   Urobilinogen, UA 0.2  0.0 - 1.0 mg/dL   Nitrite NEGATIVE  NEGATIVE   Leukocytes, UA NEGATIVE  NEGATIVE   Comment: MICROSCOPIC NOT DONE ON URINES WITH NEGATIVE PROTEIN, BLOOD, LEUKOCYTES, NITRITE, OR GLUCOSE <1000 mg/dL.  PROTIME-INR     Status: Abnormal   Collection Time    12/09/13  3:56 PM      Result Value Ref Range   Prothrombin Time 18.8 (*) 11.6 - 15.2 seconds   INR 1.62 (*) 0.00 - 1.49  PROTIME-INR     Status: Abnormal   Collection Time    12/10/13  2:42 PM      Result Value Ref Range   Prothrombin Time 19.8 (*) 11.6 - 15.2 seconds   INR 1.74 (*) 0.00 - 1.49  PROTIME-INR     Status: Abnormal   Collection Time    12/11/13  5:06 AM      Result Value Ref Range   Prothrombin Time 24.4 (*) 11.6 - 15.2 seconds   INR 2.28 (*) 0.00 - 1.49   Labs are reviewed and are pertinent for medical issues being addressed.  Zyprexa 5 mg added today and patient is to be given it only if she refused her Zyprexa Zydis at bed time  Current Facility-Administered Medications  Medication Dose Route Frequency Provider Last Rate Last Dose  . acetaminophen (TYLENOL) tablet 650 mg  650 mg Oral Q4H PRN Osvaldo Shipper, MD      . alum & mag hydroxide-simeth (MAALOX/MYLANTA) 200-200-20 MG/5ML suspension 30 mL  30 mL Oral PRN Osvaldo Shipper, MD      . calcium-vitamin D (OSCAL WITH D) 500-200 MG-UNIT per tablet 1 tablet  1 tablet Oral Q breakfast Waylan Boga, NP   1 tablet at 12/11/13 0900  . furosemide (LASIX) tablet 20 mg  20 mg Oral Daily PRN Osvaldo Shipper, MD      . ibuprofen (ADVIL,MOTRIN) tablet 600 mg  600 mg  Oral Q8H PRN Osvaldo Shipper, MD      . lamoTRIgine (LAMICTAL) tablet 100 mg  100 mg Oral QHS Osvaldo Shipper, MD   100 mg at 12/10/13 2237  . levothyroxine (SYNTHROID, LEVOTHROID) tablet 112 mcg  112 mcg Oral QAC breakfast Osvaldo Shipper, MD   112 mcg at 12/11/13 0900  . LORazepam (ATIVAN) tablet 1 mg  1 mg Oral Q8H PRN Osvaldo Shipper, MD   1 mg at 12/10/13 1204  . OLANZapine (ZYPREXA) injection 5 mg  5 mg Intramuscular Daily PRN Shuvon Rankin, NP      . OLANZapine zydis (ZYPREXA) disintegrating tablet 7.5 mg  7.5 mg Oral QHS Shuvon Rankin, NP   7.5 mg at 12/10/13 2239  . ondansetron (ZOFRAN) tablet 4 mg  4 mg Oral Q8H PRN Osvaldo Shipper, MD      . spironolactone (ALDACTONE) tablet 50 mg  50 mg Oral Daily Osvaldo Shipper, MD   50 mg at 12/10/13 1342  . Tadalafil (PAH) TABS 40 mg  40 mg Oral Daily [address] Tyler Deis., RPH   40 mg at 12/11/13 1053  . vitamin C (ASCORBIC ACID) tablet 500 mg  500 mg Oral Daily Waylan Boga, NP   500 mg at 12/11/13 1053  . Warfarin - Pharmacist Dosing Inpatient   Does not apply q1800 Osvaldo Shipper, MD      . zolpidem [facility]) tablet 5 mg  5 mg Oral QHS Shea Stakes Crowford Tyler Deis., RPH   5 mg at 12/10/13 2238   Current Outpatient Prescriptions  Medication Sig Dispense Refill  . B Complex-C (B-COMPLEX WITH VITAMIN C) tablet Take 1 tablet by mouth daily.       . calcium-vitamin D (OSCAL WITH D) 500-200 MG-UNIT per tablet Take 1 tablet by mouth 2 (two) times daily.       . furosemide (LASIX) 20 MG tablet Take 20 mg by mouth daily as needed for fluid.       Marland Kitchen lamoTRIgine (LAMICTAL) 100 MG tablet Take 100 mg by mouth at bedtime.       Marland Kitchen levothyroxine (SYNTHROID, LEVOTHROID) 112 MCG tablet Take 112 mcg by mouth daily before breakfast.       . LORazepam (ATIVAN) 1 MG tablet Take 1 tablet by mouth 2 (two) times daily as needed for anxiety or sleep.        . Multiple Vitamin (MULTIVITAMIN WITH MINERALS) TABS tablet Take 1 tablet by mouth every morning.      Marland Kitchen OLANZapine (ZYPREXA) 7.5 MG tablet Take 7.5 mg by mouth at bedtime.      Marland Kitchen spironolactone (ALDACTONE) 50 MG tablet Take 50 mg by mouth daily.       . Tadalafil, PAH, (ADCIRCA) 20 MG TABS Take 40 mg by mouth daily.       . temazepam (RESTORIL) 15 MG capsule Take 15 mg by mouth at bedtime.       . vitamin C (ASCORBIC ACID) 500 MG tablet Take 500 mg by mouth daily.       Marland Kitchen warfarin (COUMADIN) 7.5 MG tablet Take 7.5 mg by mouth daily.        Psychiatric Specialty Exam:     Blood pressure 112/86, pulse 72, temperature 98.5 F (36.9 C), temperature source Oral, resp. rate 16, SpO2 94.00%.There is no weight on file to calculate BMI.  General Appearance: Casual  Eye Contact::  Fair  Speech:  Slow  Volume:  Normal  Mood:  Anxious and Irritable  Affect:  Congruent  Thought Process:  Disorganized  Orientation:  Full (Time, Place, and Person)  Thought Content:  Negative  Suicidal Thoughts:  No  Homicidal Thoughts:  No  Memory:  Immediate;   Fair Recent;   Fair Remote;   Fair  Judgement:  Impaired  Insight:  Lacking  Psychomotor Activity:  Decreased  Concentration:  Fair  Recall:  [employer] of Knowledge:Good  Language: Good  Akathisia:  No  Handed:  Right  AIMS (if indicated):     Assets:  Financial Resources/Insurance Housing Leisure Time Resilience Social Support  Sleep:      Musculoskeletal: Strength & Muscle Tone: within normal limits Gait & Station: normal Patient leans: N/A  Treatment Plan Summary: Possible discharge to family after discussion with them. Patient is doing better and possibly baseline. Continue zyprexa which has helped mood symptoms.  Disposition:  Social services to talk with family for possible discharge to home and follow up with outpatient services.   Merian Capron, MD  12/11/2013 11:06 AM

## 2013-12-11 NOTE — ED Notes (Signed)
Found patient washing up using the sink a wash cloth and a towel.  Pt has changed her gowns and undergarment.

## 2013-12-11 NOTE — BH Assessment (Signed)
Pt declined at [facility].  Boyce Medici, MA  Disposition MHT

## 2013-12-11 NOTE — Progress Notes (Signed)
Per discussion with psychiatrist patient disposition pending further discussion with pt family. CSW left message for both daughters listed on face sheet. CSW awaiting to obtain collateral information.   Noreene Larsson [phone]  ED CSW 12/11/2013 1214pm

## 2013-12-11 NOTE — Progress Notes (Addendum)
MHT initiated psychiatric placement at the following facilities with bed availability:  1)[facility]-declined due to medical 2)[facility]-decline due to medical 3)[facility]-declined due to medical 4)[facility]-faxed referral; prior pt per Care Everywhere in 12/14 5)[facility]-under review 6)[facility]-faxed referral 7)[facility]-faxed referral 8)[facility]-declined due to medical 9)[facility]-referral fax with auth#[id number] good through 12/16/13  Wyvonnia Dusky, MHT/NS

## 2013-12-11 NOTE — ED Notes (Signed)
Charge rn spoke with [facility], pt placement is under review.

## 2013-12-11 NOTE — BH Assessment (Signed)
Pt is on [facility] wait list, per Connie.  -Davan Nawabi, MA  Disposition MHT 

## 2013-12-11 NOTE — Progress Notes (Signed)
ANTICOAGULATION CONSULT NOTE - Follow Up Consult  Pharmacy Consult for Warfarin Indication: Hx PE, severe chronic thromboembolic PAH  No Known Allergies  Patient Measurements:   TBW 71 kg 2/15  Vital Signs: BP: 112/86 mmHg (04/13 0718) Pulse Rate: 72 (04/13 0718)  Labs:  Recent Labs  12/09/13 1037 12/09/13 1556 12/10/13 1442 12/11/13 0506  HGB 13.8  --   --   --   HCT 41.4  --   --   --   PLT 208  --   --   --   LABPROT  --  18.8* 19.8* 24.4*  INR  --  1.62* 1.74* 2.28*  CREATININE 1.00  --   --   --    Medications:  Scheduled:  . calcium-vitamin D  1 tablet Oral Q breakfast  . lamoTRIgine  100 mg Oral QHS  . levothyroxine  112 mcg Oral QAC breakfast  . OLANZapine zydis  7.5 mg Oral QHS  . spironolactone  50 mg Oral Daily  . Tadalafil (PAH)  40 mg Oral Daily  . vitamin C  500 mg Oral Daily  . Warfarin - Pharmacist Dosing Inpatient   Does not apply q1800  . zolpidem  5 mg Oral QHS   Assessment: Michaela Horton on chronic warfarin for hx PE and thromboembolic PAH admitted 5/36 for agitation and decompensated schizophrenia.  Pharmacy was consulted to resume warfarin dosing inpatient. Home dose: warfarin 7.5mg  daily with last dose 4/10 @ 1600.  INR was 1.62 on presentation to ED. Warfarin doses administered during current encounter (4/12-4/12):  10mg , 7.5mg   4/13: INR 2.28 (therapeutic).   Goal of Therapy:  INR 2-3   Plan:   Warfarin 7.5mg  x 1 tonight.  Continue daily PT/INR until discharged.   Clayburn Pert, PharmD, BCPS Pager: [phone] 12/11/2013  12:42 PM

## 2013-12-12 DIAGNOSIS — F4323 Adjustment disorder with mixed anxiety and depressed mood: Secondary | ICD-10-CM

## 2013-12-12 LAB — PROTIME-INR
INR: 2.2 — ABNORMAL HIGH (ref 0.00–1.49)
Prothrombin Time: 23.7 seconds — ABNORMAL HIGH (ref 11.6–15.2)

## 2013-12-12 MED ORDER — WARFARIN SODIUM 7.5 MG PO TABS
7.5000 mg | ORAL_TABLET | Freq: Once | ORAL | Status: DC
  Filled 2013-12-12: qty 1

## 2013-12-12 MED ORDER — WARFARIN SODIUM 5 MG PO TABS
5.0000 mg | ORAL_TABLET | Freq: Once | ORAL | Status: DC
  Filled 2013-12-12: qty 1

## 2013-12-12 NOTE — Progress Notes (Signed)
ANTICOAGULATION CONSULT NOTE - Follow Up Consult  Pharmacy Consult for Warfarin Indication: Hx PE, severe chronic thromboembolic PAH  No Known Allergies  Patient Measurements:   TBW 71 kg 2/15  Vital Signs: Temp: 98 F (36.7 C) (04/14 0628) Temp src: Oral (04/14 0628) BP: 103/56 mmHg (04/14 0628) Pulse Rate: 65 (04/14 0628)  Labs:  Recent Labs  12/09/13 1037  12/10/13 1442 12/11/13 0506 12/12/13 0630  HGB 13.8  --   --   --   --   HCT 41.4  --   --   --   --   PLT 208  --   --   --   --   LABPROT  --   < > 19.8* 24.4* 23.7*  INR  --   < > 1.74* 2.28* 2.20*  CREATININE 1.00  --   --   --   --   < > = values in this interval not displayed. Medications:  Scheduled:  . calcium-vitamin D  1 tablet Oral Q breakfast  . lamoTRIgine  100 mg Oral QHS  . levothyroxine  112 mcg Oral QAC breakfast  . OLANZapine zydis  7.5 mg Oral QHS  . spironolactone  50 mg Oral Daily  . Tadalafil (PAH)  40 mg Oral Daily  . vitamin C  500 mg Oral Daily  . Warfarin - Pharmacist Dosing Inpatient   Does not apply q1800  . zolpidem  5 mg Oral QHS   Assessment: 80 yoF on chronic warfarin for hx PE and thromboembolic PAH admitted 8/56 for agitation and decompensated schizophrenia.  Pharmacy was consulted to resume warfarin dosing inpatient. Home dose: warfarin 7.5mg  daily with last dose 4/10 @ 1600.  INR was 1.62 on presentation to ED. Warfarin doses administered during current encounter (4/12-4/13):  10mg , 7.5mg ,7.5mg   4/14: INR 2.2 - remains therapeutic  Goal of Therapy:  INR 2-3   Plan:   Warfarin 7.5mg  x 1 tonight.  Continue daily PT/INR until discharged  Planning d/c to [place], [place] PharmD Pager [phone] 12/12/2013, 8:09 AM

## 2013-12-12 NOTE — Consult Note (Signed)
  Psychiatric Specialty Exam: Physical Exam  ROS  Blood pressure 134/93, pulse 85, temperature 98.1 F (36.7 C), temperature source Oral, resp. rate 18, SpO2 97.00%.There is no weight on file to calculate BMI.  General Appearance: Well Groomed  Engineer, water::  Good  Speech:  Clear and Coherent  Volume:  Normal  Mood:  Euthymic  Affect:  Appropriate  Thought Process:  Coherent  Orientation:  Full (Time, Place, and Person)  Thought Content:  Negative  Suicidal Thoughts:  No  Homicidal Thoughts:  No  Memory:  Immediate;   Good Recent;   Good Remote;   Good  Judgement:  Intact  Insight:  Fair  Psychomotor Activity:  normal  Concentration:  Good  Recall:  Good  Akathisia:  Negative  Handed:  Right  AIMS (if indicated):     Assets:  Communication Skills Housing Social Support  Sleep:      Ms Kinkade is alert appropriate today.  Says she has disagreements with her children over her independence.  Says she takes her medications and would like a home aide to help her in the daytime.  Discharge home today.
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
## 2013-12-12 NOTE — Progress Notes (Signed)
Per discussion with psychiatrist, patient psychiatrically stable for dc home. Patient requested home health services. CSW referred to rn case [employer]. CSW left message for pt daughter, Nat Christen at work, [phone] ex. [phone]. Pt preferres to dc home with [facility] private duty, and prefers for daughter to come pick pt up. Pt expressed that she does not want pt daughter [name] to be involved.   Noreene Larsson [phone]  ED CSW 12/12/2013 1239pm

## 2013-12-12 NOTE — BH Assessment (Signed)
[facility] has not reviewed referral yet, per Vaughan Basta.  Boyce Medici, MA  Disposition MHT

## 2013-12-12 NOTE — BHH Suicide Risk Assessment (Signed)
Suicide Risk Assessment  Discharge Assessment     Demographic Factors:  Age 72 or older  Total Time spent with patient: 30 minutes  Psychiatric Specialty Exam:     Blood pressure 134/93, pulse 85, temperature 98.1 F (36.7 C), temperature source Oral, resp. rate 18, SpO2 97.00%.There is no weight on file to calculate BMI.  General Appearance: Well Groomed  Engineer, water::  Good  Speech:  Clear and Coherent  Volume:  Normal  Mood:  Euthymic  Affect:  Appropriate  Thought Process:  Coherent  Orientation:  Full (Time, Place, and Person)  Thought Content:  Negative  Suicidal Thoughts:  No  Homicidal Thoughts:  No  Memory:  Immediate;   Fair Recent;   Fair Remote;   Fair  Judgement:  Intact  Insight:  Fair  Psychomotor Activity:  Normal  Concentration:  Good  Recall:  Good  Fund of Knowledge:Good  Language: Good  Akathisia:  Negative  Handed:  Right  AIMS (if indicated):     Assets:  Communication Skills Housing  Sleep:       Musculoskeletal: Strength & Muscle Tone: within normal limits Gait & Station: normal Patient leans: N/A   Mental Status Per Nursing Assessment::   On Admission:     Current Mental Status by Physician: NA  Loss Factors: NA  Historical Factors: NA  Risk Reduction Factors:   NA  Continued Clinical Symptoms:  Medical Diagnoses and Treatments/Surgeries  Cognitive Features That Contribute To Risk:  Loss of executive function    Suicide Risk:  Minimal: No identifiable suicidal ideation.  Patients presenting with no risk factors but with morbid ruminations; may be classified as minimal risk based on the severity of the depressive symptoms  Discharge Diagnoses:   AXIS I:  Adjustment Disorder with Mixed Emotional Features AXIS II:  Deferred AXIS III:   Past Medical History  Diagnosis Date  . Dyspnea   . Pulmonary embolism   . Breast cyst     right  . Hypothyroidism   . Renal cyst   . Leukocytopenia   . Monoclonal gammopathy    . Osteoarthritis   . Schizoaffective disorder   . Wears dentures   . Pulmonary hypertension    AXIS IV:  problems with primary support group AXIS V:  61-70 mild symptoms  Plan Of Care/Follow-up recommendations:  Activity:  resume usual activity Diet:  resume usual diet  Is patient on multiple antipsychotic therapies at discharge:  No   Has Patient had three or more failed trials of antipsychotic monotherapy by history:  No  Recommended Plan for Multiple Antipsychotic Therapies: NA    Clarene Reamer 12/12/2013, 2:45 PM

## 2013-12-12 NOTE — BH Assessment (Signed)
Denied at [facility] by Dr. Reather Converse - secondary to need for higher level of care, per [facility].  Boyce Medici, MA  Disposition MHT

## 2013-12-12 NOTE — BH Assessment (Signed)
NP at [facility] has to review for medical stability, per Nunzio Cory.  Boyce Medici, MA  Disposition MHT

## 2013-12-13 NOTE — Progress Notes (Signed)
12/13/13 1025 ED Cm receiva a voice message from [place] at [facility] on 12/12/13 at 1614. CM returned the call and left a voice message to answer the question about pt not wanting her daughter to be the person providing care for her from [place].   Cm received a call from [place] at [facility] again discussed pt's concern from 12/12/13 to not have [name], daughter provide her care through [facility] but she d/c with [name] on 12/12/13 Mariann Laster voiced understanding and states she will call pt 12/13/13

## 2013-12-20 ENCOUNTER — Telehealth: Payer: Self-pay | Admitting: Internal Medicine

## 2013-12-20 NOTE — Telephone Encounter (Signed)
Spoke with pt. She never had ONO done when she was out of the hospital. She wants O2 picked up bc she is not using it.  ONO is en epic to done on 4 liters O2.  Please advise MW if we need to ONO different since she wants to d/c th O2 thanks   [facility]

## 2013-12-20 NOTE — Telephone Encounter (Signed)
She is taking a major risk of early death by stopping the Dec 02, 2022 because of the severity of her PAH - she has psych issues so needs to stay on it and refer all questions re [place] to [facility]  If she wants off then must do ono RA again to be sure it's safe

## 2013-12-20 NOTE — Telephone Encounter (Signed)
L;momtcb x1 for pt.

## 2013-12-21 NOTE — Telephone Encounter (Signed)
LMTCBx2. [name], CMA  

## 2013-12-22 NOTE — Telephone Encounter (Signed)
Pt advised that she needs to keep using oxygen. Pt states understanding and states she will continue medication. Bing, CMA

## 2013-12-26 ENCOUNTER — Telehealth: Payer: Self-pay | Admitting: Internal Medicine

## 2013-12-26 DIAGNOSIS — I2724 Chronic thromboembolic pulmonary hypertension: Secondary | ICD-10-CM

## 2013-12-26 DIAGNOSIS — J9610 Chronic respiratory failure, unspecified whether with hypoxia or hypercapnia: Secondary | ICD-10-CM

## 2013-12-26 NOTE — Telephone Encounter (Signed)
LMTCB

## 2013-12-26 NOTE — Telephone Encounter (Signed)
Spoke with the pt and her daughter  She has not been to [place] since last visit here in Feb 2015  She has turned her o2 down from 4 to 2 on her own and seems to be doing okay with this  Daughter states that MW had wanted to order ONO on her, but this was never done due to pt not being home at that time She is asking if we need to order this now and when we rec that she f/u with [facility]  Please advise, thanks!

## 2013-12-26 NOTE — Telephone Encounter (Signed)
Go ahead and repeat the ono on 2lpm

## 2013-12-27 NOTE — Telephone Encounter (Signed)
Pt advised and order placed. [name], CMA  

## 2014-01-01 ENCOUNTER — Ambulatory Visit: Payer: Self-pay | Admitting: Internal Medicine

## 2014-01-12 ENCOUNTER — Telehealth: Payer: Self-pay | Admitting: Internal Medicine

## 2014-01-12 ENCOUNTER — Other Ambulatory Visit (HOSPITAL_BASED_OUTPATIENT_CLINIC_OR_DEPARTMENT_OTHER): Payer: Medicare Other

## 2014-01-12 ENCOUNTER — Ambulatory Visit (HOSPITAL_BASED_OUTPATIENT_CLINIC_OR_DEPARTMENT_OTHER): Payer: Medicare Other | Admitting: Internal Medicine

## 2014-01-12 VITALS — BP 112/66 | HR 74 | Temp 97.2°F | Resp 18 | Ht 62.0 in | Wt 152.0 lb

## 2014-01-12 DIAGNOSIS — Z86711 Personal history of pulmonary embolism: Secondary | ICD-10-CM

## 2014-01-12 DIAGNOSIS — D472 Monoclonal gammopathy: Secondary | ICD-10-CM

## 2014-01-12 DIAGNOSIS — Z9981 Dependence on supplemental oxygen: Secondary | ICD-10-CM

## 2014-01-12 DIAGNOSIS — Z800 Family history of malignant neoplasm of digestive organs: Secondary | ICD-10-CM

## 2014-01-12 DIAGNOSIS — M810 Age-related osteoporosis without current pathological fracture: Secondary | ICD-10-CM

## 2014-01-12 DIAGNOSIS — D72819 Decreased white blood cell count, unspecified: Secondary | ICD-10-CM

## 2014-01-12 DIAGNOSIS — Z9181 History of falling: Secondary | ICD-10-CM

## 2014-01-12 LAB — COMPREHENSIVE METABOLIC PANEL (CC13)
ALBUMIN: 3.7 g/dL (ref 3.5–5.0)
ALT: 16 U/L (ref 0–55)
ANION GAP: 9 meq/L (ref 3–11)
AST: 20 U/L (ref 5–34)
Alkaline Phosphatase: 52 U/L (ref 40–150)
BILIRUBIN TOTAL: 0.46 mg/dL (ref 0.20–1.20)
BUN: 13.8 mg/dL (ref 7.0–26.0)
CO2: 24 mEq/L (ref 22–29)
Calcium: 10.3 mg/dL (ref 8.4–10.4)
Chloride: 104 mEq/L (ref 98–109)
Creatinine: 1.2 mg/dL — ABNORMAL HIGH (ref 0.6–1.1)
GLUCOSE: 77 mg/dL (ref 70–140)
Potassium: 4.4 mEq/L (ref 3.5–5.1)
SODIUM: 137 meq/L (ref 136–145)
TOTAL PROTEIN: 8 g/dL (ref 6.4–8.3)

## 2014-01-12 LAB — CBC WITH DIFFERENTIAL/PLATELET
BASO%: 0.5 % (ref 0.0–2.0)
BASOS ABS: 0 10*3/uL (ref 0.0–0.1)
EOS ABS: 0.1 10*3/uL (ref 0.0–0.5)
EOS%: 2.5 % (ref 0.0–7.0)
HEMATOCRIT: 40 % (ref 34.8–46.6)
HEMOGLOBIN: 13.2 g/dL (ref 11.6–15.9)
LYMPH%: 50.3 % — AB (ref 14.0–49.7)
MCH: 31.4 pg (ref 25.1–34.0)
MCHC: 33 g/dL (ref 31.5–36.0)
MCV: 95.2 fL (ref 79.5–101.0)
MONO#: 0.3 10*3/uL (ref 0.1–0.9)
MONO%: 6.5 % (ref 0.0–14.0)
NEUT%: 40.2 % (ref 38.4–76.8)
NEUTROS ABS: 1.6 10*3/uL (ref 1.5–6.5)
PLATELETS: 227 10*3/uL (ref 145–400)
RBC: 4.2 10*6/uL (ref 3.70–5.45)
RDW: 14.8 % — ABNORMAL HIGH (ref 11.2–14.5)
WBC: 4 10*3/uL (ref 3.9–10.3)
lymph#: 2 10*3/uL (ref 0.9–3.3)

## 2014-01-12 NOTE — Progress Notes (Signed)
[name], [name] [id number]  DIAGNOSIS: MONOCLONAL GAMMOPATHY - Plan: CBC with Differential, Comprehensive metabolic panel (Cmet) - [facility], SPEP & IFE with QIG, Kappa/lambda light chains  Chief Complaint  Patient presents with  . MONOCLONAL GAMMOPATHY    CURRENT THERAPY: Observation.   INTERVAL HISTORY: Michaela Horton 73 y.o. 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  She was last seen by me on 07/13/2013. Today, she is accompanied by her daughter Michaela Horton. She reports being admitted to psych but no recent medical admission.  She reports increased edema but otherwise denies problems.  Regarding her MGUS, she denies any weight changes or bone pain.  She reports recently stopping her lithium.  Her last screening mammogram was on 06/09/2013 without findings suspicious for malignancy.    As previously noted, she was seen in the Emergency room on 06-07-2013 for headache by Dr. Dina Rich.  CT head was negative for bleed.  She was discharged to home with follow with psychiatry as an outpatient.  She was seen in the emergency room on 05-20-2013 with left foot discoloration and was instructed to follow up with her primary care physician. She was seen on 05-06-2013 in the Emergency room for right hip pain.  XR was done on 08/31 which was negative.  CT of hip and uranalysis was negative for acute pathology.  She was instructed to take tylenol prn and follow as an outpatient.  On 04/29/2013, she was admitted for a fall that she had while getting out of bed without syncope, dizziness, CP or palpitations.  She hit her head and came to ER.  Her INR was 2.4 and CT head showed a  small subdural hematoma.  Her INR was reversed and follow up CT of head performed.  No surgical intervention per neurosurgery.   On 04/16/2013, she presented to the ER with leg swelling and shortness of breath.  Negative lower extremity doppler was found and her INR was therapeutic.  On 03/25/2013, she presented to the emergency room for worsening insomnia.  She was discharged with zolpidem 5 mg qhs.  She instructed to follow up with her psychiatrist in 10 days.   MEDICAL HISTORY: Past Medical History  Diagnosis Date  . Dyspnea   . Pulmonary embolism   . Breast cyst     right  . Hypothyroidism   . Renal cyst   . Leukocytopenia   . Monoclonal gammopathy   . Osteoarthritis   . Schizoaffective disorder   . Wears dentures   . Pulmonary hypertension     INTERIM HISTORY: has HYPOTHYROIDISM; MONOCLONAL GAMMOPATHY; LEUKOCYTOPENIA UNSPECIFIED; SCHIZOAFFECTIVE DISORDER; RENAL CYST, RIGHT; BREAST CYST, RIGHT; OSTEOARTHRITIS; Chronic thromboembolic pulmonary hypertension; Chronic respiratory failure; Subdural hematoma, post-traumatic; Acute kidney injury; Bipolar disorder; Altered mental state; Drug overdose; Aggressive behavior, adult; and Adjustment disorder with mixed disturbance of emotions and conduct on her problem list.    ALLERGIES:  has No Known Allergies.  MEDICATIONS: has a current medication list which includes the following prescription(s): b-complex with vitamin c, bosentan, calcium-vitamin d, furosemide, levothyroxine, lithium carbonate, lorazepam, multivitamin with minerals, spironolactone, tadalafil (pah), tadalafil (pah), temazepam, vitamin c, and warfarin.  SURGICAL HISTORY:  Past Surgical History  Procedure Laterality Date  . Tubal ligation  1970  . Tubal ligation    .  Breast surgery      cyst   PROBLEM LIST:  1. 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 Urine was also positive for IgG kappa monoclonal protein. A 24-hour urine  protein at that time was normal. Metastatic bone survey carried out on 03/29/2003 was negative. Follow up immunoglobulins have been without any significant changes. There has been no suggestion of progression to multiple myeloma.  2. History of leukopenia noted in November 2002.  3. History of schizoaffective disorder dating back to 64.  4. History of goiter treated with radioactive iodine in 2002, resulting in hypothyroidism under treatment.  5. Osteoporosis.  6. History of pulmonary emboli, on June 24, 2010, without precipitating factors. Current recommendation is for ongoing anticoagulation. A hypercoagulable workup was negative.  7. Pulmonary hypertension being followed at [facility] by Dr. Winona Legato.  8. 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  9. Positive family history of colon cancer. The patient had a colonoscopy in 2008 and again on December 14, 2011, by Dr. Watt Climes. Pathology report indicated multiple fragments of a serrated adenoma. No high-grade dysplasia or malignancy was identified.  REVIEW OF SYSTEMS:   Constitutional: Denies fevers, chills or abnormal weight loss Eyes: Denies blurriness of vision Ears, nose, mouth, throat, and face: Denies mucositis or sore throat Respiratory: Denies cough, dyspnea or wheezes Cardiovascular: Denies palpitation, chest discomfort or lower extremity swelling Gastrointestinal:  Denies nausea, heartburn or change in bowel habits Skin: Denies abnormal skin rashes Lymphatics: Denies new lymphadenopathy or easy bruising Neurological:Denies numbness, tingling or new weaknesses Behavioral/Psych: Mood is stable, no new changes  All other systems were reviewed with the patient and are negative.  PHYSICAL EXAMINATION: ECOG PERFORMANCE STATUS: 1 - Symptomatic but completely ambulatory  Blood pressure 112/66, pulse 74, temperature 97.2 F (36.2 C), temperature source Oral, resp. rate 18, height _0  (1.575 m), weight 152 lb (68.947 kg), SpO2  95.00%.  GENERAL:alert, no distress and comfortable; anxious appearing and tearful initial; on oxygen 4 liter/min by nasal cannula;  SKIN: skin color, texture, turgor are normal, no rashes or significant lesions EYES: normal, Conjunctiva are pink and non-injected, sclera clear OROPHARYNX:no exudate, no erythema and lips, buccal mucosa, and tongue normal  LYMPH:  no palpable lymphadenopathy in the cervical or supraclavicular LUNGS: clear to auscultation and percussion with normal breathing effort HEART: regular rate & rhythm and no murmurs and no lower extremity edema ABDOMEN:abdomen soft, non-tender and normal bowel sounds Musculoskeletal:no cyanosis of digits and no clubbing  NEURO: alert & oriented x 3 with fluent speech, no focal motor/sensory deficits  LABORATORY DATA: Results for orders placed in visit on 01/12/14 (from the past 48 hour(s))  CBC WITH DIFFERENTIAL     Status: Abnormal   Collection Time    01/12/14  2:40 PM      Result Value Ref Range   WBC 4.0  3.9 - 10.3 10e3/uL   NEUT# 1.6  1.5 - 6.5 10e3/uL   HGB 13.2  11.6 - 15.9 g/dL   HCT 40.0  34.8 - 46.6 %   Platelets 227  145 - 400 10e3/uL   MCV 95.2  79.5 - 101.0 fL   MCH 31.4  25.1 - 34.0 pg   MCHC 33.0  31.5 - 36.0 g/dL   RBC 4.20  3.70 - 5.45 10e6/uL   RDW 14.8 (*) 11.2 - 14.5 %   lymph# 2.0  0.9 - 3.3 10e3/uL   MONO# 0.3  0.1 - 0.9 10e3/uL   Eosinophils  Absolute 0.1  0.0 - 0.5 10e3/uL   Basophils Absolute 0.0  0.0 - 0.1 10e3/uL   NEUT% 40.2  38.4 - 76.8 %   LYMPH% 50.3 (*) 14.0 - 49.7 %   MONO% 6.5  0.0 - 14.0 %   EOS% 2.5  0.0 - 7.0 %   BASO% 0.5  0.0 - 2.0 %  COMPREHENSIVE METABOLIC PANEL (ST41)     Status: Abnormal   Collection Time    01/12/14  2:40 PM      Result Value Ref Range   Sodium 137  136 - 145 mEq/L   Potassium 4.4  3.5 - 5.1 mEq/L   Chloride 104  98 - 109 mEq/L   CO2 24  22 - 29 mEq/L   Glucose 77  70 - 140 mg/dl   BUN 13.8  7.0 - 26.0 mg/dL   Creatinine 1.2 (*) 0.6 - 1.1 mg/dL   Total  Bilirubin 0.46  0.20 - 1.20 mg/dL   Alkaline Phosphatase 52  40 - 150 U/L   AST 20  5 - 34 U/L   ALT 16  0 - 55 U/L   Total Protein 8.0  6.4 - 8.3 g/dL   Albumin 3.7  3.5 - 5.0 g/dL   Calcium 10.3  8.4 - 10.4 mg/dL   Anion Gap 9  3 - 11 mEq/L     Labs:  Lab Results  Component Value Date   WBC 4.0 01/12/2014   HGB 13.2 01/12/2014   HCT 40.0 01/12/2014   MCV 95.2 01/12/2014   PLT 227 01/12/2014   NEUTROABS 1.6 01/12/2014      Chemistry      Component Value Date/Time   NA 137 01/12/2014 1440   NA 141 12/09/2013 1037   NA 139 07/31/2013 1446   K 4.4 01/12/2014 1440   K 4.1 12/09/2013 1037   CL 106 12/09/2013 1037   CL 105 01/09/2013 1015   CO2 24 01/12/2014 1440   CO2 25 12/09/2013 1037   BUN 13.8 01/12/2014 1440   BUN 13 12/09/2013 1037   BUN 16 07/31/2013 1446   CREATININE 1.2* 01/12/2014 1440   CREATININE 1.00 12/09/2013 1037      Component Value Date/Time   CALCIUM 10.3 01/12/2014 1440   CALCIUM 9.5 12/09/2013 1037   ALKPHOS 52 01/12/2014 1440   ALKPHOS 51 12/09/2013 1037   AST 20 01/12/2014 1440   AST 17 12/09/2013 1037   ALT 16 01/12/2014 1440   ALT 16 12/09/2013 1037   BILITOT 0.46 01/12/2014 1440   BILITOT 0.3 12/09/2013 1037     Basic Metabolic Panel:  Recent Labs Lab 01/12/14 1440  NA 137  K 4.4  CO2 24  GLUCOSE 77  BUN 13.8  CREATININE 1.2*  CALCIUM 10.3   GFR Estimated Creatinine Clearance: 39.1 ml/min (by C-G formula based on Cr of 1.2). Liver Function Tests:  Recent Labs Lab 01/12/14 1440  AST 20  ALT 16  ALKPHOS 52  BILITOT 0.46  PROT 8.0  ALBUMIN 3.7   CBC:  Recent Labs Lab 01/12/14 1440  WBC 4.0  NEUTROABS 1.6  HGB 13.2  HCT 40.0  MCV 95.2  PLT 227   Microbiology No results found for this or any previous visit (from the past 240 hour(s)). RADIOGRAPHIC STUDIES: 1. CT angiogram of the chest with IV contrast on 06/24/2010 showed possible pulmonary embolism. Blood clots were seen bilaterally in segmental and subsegmental arteries. Emboli  appeared to be acute.  There were tiny bilateral  pleural effusions. Small to moderate pericardial effusion.  2. CT scan of the abdomen and pelvis with IV contrast carried out on 08/15/2010 showed no CT-PET findings for metastatic disease involving the abdomen or pelvis. There were simple appearing  hepatic and right renal cysts.  3. Chest x-ray, 2 view, from 11/10/2010 showed no evidence for acute cardiopulmonary disease.  4. Screening bilateral mammograms from 04/21/2011 showed no specific mammographic evidence for malignancy.  5. CT scan of abdomen and pelvis with IV contrast on 02/25/2012 showed that the duodenum was under-distended, making evaluation difficult. Proximal jejunal wall appeared to be mildly thickened, possibly inflammatory in etiology. There were tiny bilateral pleural effusions, right greater than left, and a tiny pericardial effusion.  6. Digital bilateral screening mammogram on 06/08/2012 was negative. 7. Digital bilateral screening mammogram on 06/09/2013 was negative.    ASSESSMENT: Diem Dicocco 73 y.o. female with a history of MONOCLONAL GAMMOPATHY - Plan: CBC with Differential, Comprehensive metabolic panel (Cmet) - [facility], SPEP & IFE with QIG, Kappa/lambda light chains   PLAN:  1. IgG Kappa MGUS. -- Mrs. Marquart' clinical condition appears to be stable. In addition, her serum and urine protein studies also seem to be stable. Even though her IgG level has been significantly elevated, this appears to be a benign monoclonal gammopathy. It appears that the patient has never had a bone marrow exam. She has a very tiny amount of monoclonal protein in her urine. Thus far, there has been no suggestion of progression to multiple myeloma. We will continue observation. She has a normal calcium, hemoglobin, and creatinine less than 2.  She is aware that 1% of patient progress per year to multiple myeloma.  We will follow her MM markers.   2. History of pulmonary emboli (June 24, 2010)  without precipitating factors. Chronic thromboembolic Pulm HTN. + IVC filter --continue Warfarin 7.5 mg 4 days then 5 mg x days.  INR check being done monthly per patient.  --Being followed by [facility] (Dr. Winona Legato).  --Prior hypercoagulable workup was negative.    3. History of a fall on 04/29/2013.  -- Fall precautions.  We provided a detailed handout.  We are discussed that if this persists in the setting of being on coumadin and risk/benefit discussion should be ongoing.  She understood to report any additional falls.   4. Osteoporosis. -- She will continue calcium-vitamin D daily.  She will possibly benefit from physical therapy for bone strengthening exercises.    5. Positive family history of colon cancer. -- Reported [facility] COLONOSCOPY scheduled for 12/14/2011. Dr Watt Climes showed that polyps were removed from the cecum and the ascending colon.  Multiple fragments of serrated adenoma revealed on pathology.  No high-grade dysplasia or malignancy identified.    6. Chronic Oxygen use.  -- Secondary to #3, management per pulmonary.   7. Follow-up.  --We will plan to see her again in 6 months, at which time we will check CBC, chemistries, and quantitative immunoglobulins.  All questions were answered. The patient knows to call the clinic with any problems, questions or concerns. We can certainly see the patient much sooner if necessary.  I spent 15 minutes counseling the patient face to face. The total time spent in the appointment was 25 minutes.    Concha Norway, MD 01/12/2014 4:19 PM

## 2014-01-12 NOTE — Telephone Encounter (Signed)
gva dn printed appt sched adn avs for pt for NOV °

## 2014-01-15 ENCOUNTER — Other Ambulatory Visit: Payer: Self-pay | Admitting: Family Medicine

## 2014-01-15 ENCOUNTER — Other Ambulatory Visit (HOSPITAL_COMMUNITY)
Admission: RE | Admit: 2014-01-15 | Discharge: 2014-01-15 | Disposition: A | Discharge status: Home / Self Care | Payer: Medicare Other | Source: Ambulatory Visit | Attending: Family Medicine | Admitting: Family Medicine

## 2014-01-15 DIAGNOSIS — Z124 Encounter for screening for malignant neoplasm of cervix: Secondary | ICD-10-CM | POA: Insufficient documentation

## 2014-01-16 LAB — SPEP & IFE WITH QIG
ALBUMIN ELP: 52.7 % — AB (ref 55.8–66.1)
ALPHA-2-GLOBULIN: 7.4 % (ref 7.1–11.8)
Alpha-1-Globulin: 5.2 % — ABNORMAL HIGH (ref 2.9–4.9)
BETA 2: 2.2 % — AB (ref 3.2–6.5)
Beta Globulin: 5.6 % (ref 4.7–7.2)
Gamma Globulin: 26.9 % — ABNORMAL HIGH (ref 11.1–18.8)
IGG (IMMUNOGLOBIN G), SERUM: 2050 mg/dL — AB (ref 690–1700)
IgA: 52 mg/dL — ABNORMAL LOW (ref 69–380)
IgM, Serum: 29 mg/dL — ABNORMAL LOW (ref 52–322)
M-Spike, %: 1.75 g/dL
TOTAL PROTEIN, SERUM ELECTROPHOR: 7.7 g/dL (ref 6.0–8.3)

## 2014-01-16 LAB — KAPPA/LAMBDA LIGHT CHAINS
Kappa free light chain: 1.05 mg/dL (ref 0.33–1.94)
Kappa:Lambda Ratio: 8.08 — ABNORMAL HIGH (ref 0.26–1.65)
Lambda Free Lght Chn: 0.13 mg/dL — ABNORMAL LOW (ref 0.57–2.63)

## 2014-01-24 ENCOUNTER — Encounter (HOSPITAL_COMMUNITY): Payer: Self-pay | Admitting: Emergency Medicine

## 2014-01-24 ENCOUNTER — Emergency Department (HOSPITAL_COMMUNITY)
Admission: EM | Admit: 2014-01-24 | Discharge: 2014-01-25 | Disposition: A | Discharge status: Home / Self Care | Payer: Medicare Other | Attending: Emergency Medicine | Admitting: Emergency Medicine

## 2014-01-24 DIAGNOSIS — I2724 Chronic thromboembolic pulmonary hypertension: Secondary | ICD-10-CM

## 2014-01-24 DIAGNOSIS — N179 Acute kidney failure, unspecified: Secondary | ICD-10-CM

## 2014-01-24 DIAGNOSIS — F259 Schizoaffective disorder, unspecified: Secondary | ICD-10-CM

## 2014-01-24 DIAGNOSIS — E039 Hypothyroidism, unspecified: Secondary | ICD-10-CM

## 2014-01-24 DIAGNOSIS — F4325 Adjustment disorder with mixed disturbance of emotions and conduct: Secondary | ICD-10-CM

## 2014-01-24 DIAGNOSIS — F4324 Adjustment disorder with disturbance of conduct: Secondary | ICD-10-CM

## 2014-01-24 DIAGNOSIS — M25559 Pain in unspecified hip: Secondary | ICD-10-CM | POA: Insufficient documentation

## 2014-01-24 DIAGNOSIS — M1990 Unspecified osteoarthritis, unspecified site: Secondary | ICD-10-CM

## 2014-01-24 DIAGNOSIS — J9610 Chronic respiratory failure, unspecified whether with hypoxia or hypercapnia: Secondary | ICD-10-CM

## 2014-01-24 DIAGNOSIS — F911 Conduct disorder, childhood-onset type: Secondary | ICD-10-CM | POA: Insufficient documentation

## 2014-01-24 DIAGNOSIS — S065X9A Traumatic subdural hemorrhage with loss of consciousness of unspecified duration, initial encounter: Secondary | ICD-10-CM

## 2014-01-24 DIAGNOSIS — Z79899 Other long term (current) drug therapy: Secondary | ICD-10-CM | POA: Insufficient documentation

## 2014-01-24 DIAGNOSIS — S065XAA Traumatic subdural hemorrhage with loss of consciousness status unknown, initial encounter: Secondary | ICD-10-CM

## 2014-01-24 DIAGNOSIS — I2789 Other specified pulmonary heart diseases: Secondary | ICD-10-CM | POA: Insufficient documentation

## 2014-01-24 DIAGNOSIS — N6009 Solitary cyst of unspecified breast: Secondary | ICD-10-CM

## 2014-01-24 DIAGNOSIS — Z86711 Personal history of pulmonary embolism: Secondary | ICD-10-CM | POA: Insufficient documentation

## 2014-01-24 DIAGNOSIS — F319 Bipolar disorder, unspecified: Secondary | ICD-10-CM

## 2014-01-24 DIAGNOSIS — F4323 Adjustment disorder with mixed anxiety and depressed mood: Secondary | ICD-10-CM | POA: Insufficient documentation

## 2014-01-24 DIAGNOSIS — Z7901 Long term (current) use of anticoagulants: Secondary | ICD-10-CM | POA: Insufficient documentation

## 2014-01-24 DIAGNOSIS — D72819 Decreased white blood cell count, unspecified: Secondary | ICD-10-CM

## 2014-01-24 DIAGNOSIS — R4182 Altered mental status, unspecified: Secondary | ICD-10-CM

## 2014-01-24 DIAGNOSIS — D472 Monoclonal gammopathy: Secondary | ICD-10-CM

## 2014-01-24 DIAGNOSIS — N281 Cyst of kidney, acquired: Secondary | ICD-10-CM

## 2014-01-24 DIAGNOSIS — R4689 Other symptoms and signs involving appearance and behavior: Secondary | ICD-10-CM

## 2014-01-24 DIAGNOSIS — I6200 Nontraumatic subdural hemorrhage, unspecified: Secondary | ICD-10-CM | POA: Insufficient documentation

## 2014-01-24 DIAGNOSIS — T50901A Poisoning by unspecified drugs, medicaments and biological substances, accidental (unintentional), initial encounter: Secondary | ICD-10-CM

## 2014-01-24 LAB — COMPREHENSIVE METABOLIC PANEL
ALK PHOS: 54 U/L (ref 39–117)
ALT: 21 U/L (ref 0–35)
AST: 23 U/L (ref 0–37)
Albumin: 3.8 g/dL (ref 3.5–5.2)
BUN: 20 mg/dL (ref 6–23)
CALCIUM: 10 mg/dL (ref 8.4–10.5)
CO2: 21 mEq/L (ref 19–32)
CREATININE: 1.53 mg/dL — AB (ref 0.50–1.10)
Chloride: 103 mEq/L (ref 96–112)
GFR calc non Af Amer: 33 mL/min — ABNORMAL LOW (ref >90)
GFR, EST AFRICAN AMERICAN: 38 mL/min — AB (ref >90)
Glucose, Bld: 133 mg/dL — ABNORMAL HIGH (ref 70–99)
POTASSIUM: 4.1 meq/L (ref 3.7–5.3)
Sodium: 138 mEq/L (ref 137–147)
TOTAL PROTEIN: 8.4 g/dL — AB (ref 6.0–8.3)
Total Bilirubin: 0.7 mg/dL (ref 0.3–1.2)

## 2014-01-24 LAB — CBC WITH DIFFERENTIAL/PLATELET
Basophils Absolute: 0.1 10*3/uL (ref 0.0–0.1)
Basophils Relative: 1 % (ref 0–1)
EOS ABS: 0.1 10*3/uL (ref 0.0–0.7)
EOS PCT: 2 % (ref 0–5)
HCT: 43 % (ref 36.0–46.0)
Hemoglobin: 14.2 g/dL (ref 12.0–15.0)
Lymphocytes Relative: 40 % (ref 12–46)
Lymphs Abs: 1.9 10*3/uL (ref 0.7–4.0)
MCH: 30.9 pg (ref 26.0–34.0)
MCHC: 33 g/dL (ref 30.0–36.0)
MCV: 93.5 fL (ref 78.0–100.0)
MONOS PCT: 6 % (ref 3–12)
Monocytes Absolute: 0.3 10*3/uL (ref 0.1–1.0)
Neutro Abs: 2.4 10*3/uL (ref 1.7–7.7)
Neutrophils Relative %: 51 % (ref 43–77)
Platelets: 235 10*3/uL (ref 150–400)
RBC: 4.6 MIL/uL (ref 3.87–5.11)
RDW: 14.2 % (ref 11.5–15.5)
WBC: 4.8 10*3/uL (ref 4.0–10.5)

## 2014-01-24 LAB — PROTIME-INR
INR: 1.45 (ref 0.00–1.49)
Prothrombin Time: 17.3 seconds — ABNORMAL HIGH (ref 11.6–15.2)

## 2014-01-24 LAB — SALICYLATE LEVEL

## 2014-01-24 LAB — LITHIUM LEVEL: Lithium Lvl: <0.25 mEq/L — ABNORMAL LOW (ref 0.80–1.40)

## 2014-01-24 LAB — ETHANOL

## 2014-01-24 LAB — ACETAMINOPHEN LEVEL

## 2014-01-24 MED ORDER — ZIPRASIDONE MESYLATE 20 MG IM SOLR: 20.0000 mg | Freq: Four times a day (QID) | INTRAMUSCULAR | Status: DC | PRN

## 2014-01-24 MED ORDER — SPIRONOLACTONE 50 MG PO TABS
50.0000 mg | ORAL_TABLET | Freq: Every day | ORAL | Status: DC
  Filled 2014-01-24: qty 1

## 2014-01-24 MED ORDER — VITAMIN C 500 MG PO TABS
500.0000 mg | ORAL_TABLET | Freq: Every day | ORAL | Status: DC
  Administered 2014-01-25: 500 mg via ORAL
  Filled 2014-01-24 (×2): qty 1

## 2014-01-24 MED ORDER — BOSENTAN 125 MG PO TABS: 125.0000 mg | ORAL_TABLET | Freq: Two times a day (BID) | ORAL | Status: DC

## 2014-01-24 MED ORDER — ADULT MULTIVITAMIN W/MINERALS CH
1.0000 | ORAL_TABLET | Freq: Every morning | ORAL | Status: DC
  Administered 2014-01-25: 1 via ORAL
  Filled 2014-01-24: qty 1

## 2014-01-24 MED ORDER — STERILE WATER FOR INJECTION IJ SOLN
INTRAMUSCULAR | Status: AC
  Administered 2014-01-24: 10 mL
  Filled 2014-01-24: qty 10

## 2014-01-24 MED ORDER — FUROSEMIDE 20 MG PO TABS
20.0000 mg | ORAL_TABLET | Freq: Every day | ORAL | Status: DC | PRN
  Filled 2014-01-24: qty 1

## 2014-01-24 MED ORDER — CALCIUM CARBONATE-VITAMIN D 500-200 MG-UNIT PO TABS
1.0000 | ORAL_TABLET | Freq: Two times a day (BID) | ORAL | Status: DC
  Administered 2014-01-25: 1 via ORAL
  Filled 2014-01-24 (×3): qty 1

## 2014-01-24 MED ORDER — HALOPERIDOL LACTATE 5 MG/ML IJ SOLN
5.0000 mg | Freq: Once | INTRAMUSCULAR | Status: AC
  Administered 2014-01-24: 5 mg via INTRAMUSCULAR
  Filled 2014-01-24: qty 1

## 2014-01-24 MED ORDER — LORAZEPAM 1 MG PO TABS: 1.0000 mg | ORAL_TABLET | Freq: Two times a day (BID) | ORAL | Status: DC | PRN

## 2014-01-24 MED ORDER — LEVOTHYROXINE SODIUM 112 MCG PO TABS
112.0000 ug | ORAL_TABLET | Freq: Every day | ORAL | Status: DC
  Administered 2014-01-25: 112 ug via ORAL
  Filled 2014-01-24 (×2): qty 1

## 2014-01-24 MED ORDER — TADALAFIL (PAH) 20 MG PO TABS: 40.0000 mg | ORAL_TABLET | Freq: Every day | ORAL | Status: DC

## 2014-01-24 MED ORDER — LITHIUM CARBONATE 150 MG PO CAPS
150.0000 mg | ORAL_CAPSULE | Freq: Every day | ORAL | Status: DC
  Filled 2014-01-24 (×2): qty 1

## 2014-01-24 MED ORDER — LORAZEPAM 2 MG/ML IJ SOLN
2.0000 mg | Freq: Once | INTRAMUSCULAR | Status: AC
  Administered 2014-01-24: 2 mg via INTRAMUSCULAR
  Filled 2014-01-24: qty 1

## 2014-01-24 MED ORDER — TEMAZEPAM 15 MG PO CAPS: 15.0000 mg | ORAL_CAPSULE | Freq: Every day | ORAL | Status: DC

## 2014-01-24 MED ORDER — B COMPLEX-C PO TABS
1.0000 | ORAL_TABLET | Freq: Every day | ORAL | Status: DC
  Administered 2014-01-25: 1 via ORAL
  Filled 2014-01-24 (×2): qty 1

## 2014-01-24 MED ORDER — WARFARIN SODIUM 7.5 MG PO TABS
7.5000 mg | ORAL_TABLET | Freq: Every day | ORAL | Status: DC
  Filled 2014-01-24 (×2): qty 1

## 2014-01-24 MED ORDER — ZIPRASIDONE MESYLATE 20 MG IM SOLR
20.0000 mg | Freq: Once | INTRAMUSCULAR | Status: AC
  Administered 2014-01-24: 20 mg via INTRAMUSCULAR
  Filled 2014-01-24: qty 20

## 2014-01-24 MED ORDER — TADALAFIL (PAH) 20 MG PO TABS
40.0000 mg | ORAL_TABLET | Freq: Every day | ORAL | Status: DC
  Filled 2014-01-24: qty 2

## 2014-01-24 NOTE — BH Assessment (Signed)
Tele Assessment Note   Michaela Horton is a 73 y.o. female who presents via IVC petition, initiated by her daughter.  This writer attempted to interview pt, however she was verbally aggressive and uncooperative.  Pt has pressured and loud speech and uses continuous expletives during the brief encounter.  Pt told this writer several times to get out of her hospital room--"get the hell out of my room."  Pt continues to scream and yell at medical staff.  Pt was placed 4-point soft restraints due to aggressive behavior.    This Probation officer contacted pt.'s daughter for collateral information:  Pt was brought in by police dept after an appt with Dr. Martina Sinner, suggesting that pt be placed with [facility] due to her belligerent behavior while in the physician's office.  Pt currently lives alone and receives care from [facility] [phone]), they come to patient's home 3 days a week and administers meds and provides other assistance within the home. Pt has been refuses to take her medications.  Pt denies SI/HI/AVH.  She is receiving outpatient services with Dr. Norma Fredrickson and [facility]).  Pt.'s daughter states that pt has not physically aggressive with family, but is verbally aggressive towards them.  She states that pt left a pot on the stove 1 day ago and locked herself out of her home and called her daughter from a neighbors home, when her daughter arrived to unlock the home, pt was already inside, stating she thought she locked herself out. Pt.'s daughter says that on Sunday, the neighbors called her and told her the pt was outside, naked.  Per daughter, pt uses O2 when oxygen level are low.    Axis I: Bipolar I disorder, Current or most recent episode unspecified Axis II: Deferred Axis III:  Past Medical History  Diagnosis Date  . Dyspnea   . Pulmonary embolism   . Breast cyst     right  . Hypothyroidism   . Renal cyst   . Leukocytopenia   . Monoclonal gammopathy   .  Osteoarthritis   . Schizoaffective disorder   . Wears dentures   . Pulmonary hypertension    Axis IV: other psychosocial or environmental problems, problems related to social environment and problems with primary support group Axis V: 31-40 impairment in reality testing  Past Medical History:  Past Medical History  Diagnosis Date  . Dyspnea   . Pulmonary embolism   . Breast cyst     right  . Hypothyroidism   . Renal cyst   . Leukocytopenia   . Monoclonal gammopathy   . Osteoarthritis   . Schizoaffective disorder   . Wears dentures   . Pulmonary hypertension     Past Surgical History  Procedure Laterality Date  . Tubal ligation  1970  . Tubal ligation    . Breast surgery      cyst    Family History:  Family History  Problem Relation Age of Onset  . Cancer Father     PROSTATE  . Atrial fibrillation Mother   . Alzheimer's disease Mother     Social History:  reports that she has never smoked. She has never used smokeless tobacco. She reports that she does not drink alcohol or use illicit drugs.  Additional Social History:  Alcohol / Drug Use Pain Medications: See MAR  Prescriptions: See MAR  Over the Counter: See MAR  History of alcohol / drug use?: No history of alcohol / drug abuse Longest period of sobriety (  when/how long): None   CIWA: CIWA-Ar BP: 141/100 mmHg Pulse Rate: 89 COWS:    Allergies: No Known Allergies  Home Medications:  (Not in a hospital admission)  OB/GYN Status:  No LMP recorded. Patient is postmenopausal.  General Assessment Data Location of Assessment: [facility] Is this a Tele or Face-to-Face Assessment?: Face-to-Face Is this an Initial Assessment or a Re-assessment for this encounter?: Initial Assessment Living Arrangements: Alone Can pt return to current living arrangement?: No Admission Status: Involuntary Is patient capable of signing voluntary admission?: No Transfer from: [facility] Referral Source: MD  Medical  Screening Exam ([place]) Medical Exam completed: No Reason for MSE not completed: Other: ([place] )  [place] Living Arrangements: Alone Name of Psychiatrist: Dr. Norma Fredrickson  Name of Therapist: Celesta Gentile   Education Status Is patient currently in school?: No Current Grade: None  Highest grade of school patient has completed: None  Name of school: None  Contact person: None   Risk to self Suicidal Ideation: No Suicidal Intent: No Is patient at risk for suicide?: No Suicidal Plan?: No Access to Means: No What has been your use of drugs/alcohol within the last 12 months?: None  Previous Attempts/Gestures: No How many times?: 0 Other Self Harm Risks: None  Triggers for Past Attempts: None known Intentional Self Injurious Behavior: None Family Suicide History: No Recent stressful life event(s): Other (Comment) (Chronic Mental Health) Persecutory voices/beliefs?: No Depression: Yes Depression Symptoms: Feeling angry/irritable Substance abuse history and/or treatment for substance abuse?: No Suicide prevention information given to non-admitted patients: Not applicable  Risk to Others Homicidal Ideation: No Thoughts of Harm to Others: No Current Homicidal Intent: No Current Homicidal Plan: No Access to Homicidal Means: No Identified Victim: None  History of harm to others?: No Assessment of Violence: None Noted Violent Behavior Description: None  Does patient have access to weapons?: No Criminal Charges Pending?: No Does patient have a court date: No  Psychosis Hallucinations: None noted Delusions: None noted  Mental Status Report Appear/Hygiene: Disheveled Eye Contact: Fair Motor Activity: Unremarkable Speech: Aggressive;Loud;Tangential Level of Consciousness: Alert Anxiety Level: None Thought Processes: Flight of Ideas;Tangential Judgement: Impaired Orientation: Person;Place;Situation Obsessive Compulsive Thoughts/Behaviors:  None  Cognitive Functioning Concentration: Decreased Memory: Recent Intact;Remote Intact IQ: Average Insight: Poor Impulse Control: Poor Appetite: Fair Weight Loss: 0 Weight Gain: 0 Sleep: No Change Total Hours of Sleep: 5 Vegetative Symptoms: None  ADLScreening [facility] Assessment Services) Patient's cognitive ability adequate to safely complete daily activities?: Yes Patient able to express need for assistance with ADLs?: Yes Independently performs ADLs?: Yes (appropriate for developmental age)  Prior Inpatient Therapy Prior Inpatient Therapy: Yes Prior Therapy Dates: 2008 Prior Therapy Facilty/Provider(s): [facility], [place], [place]  Reason for Treatment: Mental Illness   Prior Outpatient Therapy Prior Outpatient Therapy: Yes Prior Therapy Dates: Current  Prior Therapy Facilty/Provider(s): [facility]  Reason for Treatment: Med Mgt/Therapy   ADL Screening (condition at time of admission) Patient's cognitive ability adequate to safely complete daily activities?: Yes Is the patient deaf or have difficulty hearing?: No Does the patient have difficulty seeing, even when wearing glasses/contacts?: No Does the patient have difficulty concentrating, remembering, or making decisions?: Yes Patient able to express need for assistance with ADLs?: Yes Does the patient have difficulty dressing or bathing?: No Independently performs ADLs?: Yes (appropriate for developmental age) Does the patient have difficulty walking or climbing stairs?: No Weakness of Legs: None Weakness of Arms/Hands: None  Home Assistive Devices/Equipment Home Assistive  Devices/Equipment: None  Therapy Consults (therapy consults require a physician order) PT Evaluation Needed: No OT Evalulation Needed: No SLP Evaluation Needed: No Abuse/Neglect Assessment (Assessment to be complete while patient is alone) Physical Abuse: Denies Verbal Abuse: Denies Sexual Abuse: Denies Exploitation of  patient/patient's resources: Denies Self-Neglect: Denies Values / Beliefs Cultural Requests During Hospitalization: None Spiritual Requests During Hospitalization: None Consults Spiritual Care Consult Needed: No Social Work Consult Needed: No Regulatory affairs officer (For Healthcare) Advance Directive: Patient does not have advance directive;Patient would not like information Pre-existing out of facility DNR order (yellow form or pink MOST form): No Nutrition Screen- [facility] Patient's home diet: Regular  Additional Information 1:1 In Past 12 Months?: No CIRT Risk: No Elopement Risk: No Does patient have medical clearance?: Yes     Disposition:  Disposition Initial Assessment Completed for this Encounter: Yes Disposition of Patient: Referred to (AM psych eval for final disposition ) Patient referred to: Other (Comment) (AM psych eval for final disposition )  Girtha Rm 01/24/2014 10:21 PM

## 2014-01-24 NOTE — ED Notes (Signed)
Pt BIB [facility] handcuffed, under IVC.  IVC states: She si a danger to harm herself and/or others.  Her doctor (Dr. Maylene Roes, [facility]) recommended she be committed for evaluation and possible placement in [place] facility.  She has locked herself out with a pot on the stove.  She has not been taking her meds.  Lithium and abilify.  She has been committed several times this year.

## 2014-01-24 NOTE — ED Notes (Signed)
Impossible to get information from patient.  Pt yells back the questions you ask and repeatedly yells "DAMN IT!"
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
## 2014-01-25 ENCOUNTER — Encounter (HOSPITAL_COMMUNITY): Payer: Self-pay | Admitting: Registered Nurse

## 2014-01-25 ENCOUNTER — Emergency Department (HOSPITAL_COMMUNITY): Payer: Medicare Other

## 2014-01-25 DIAGNOSIS — F4324 Adjustment disorder with disturbance of conduct: Secondary | ICD-10-CM | POA: Diagnosis present

## 2014-01-25 LAB — BASIC METABOLIC PANEL
BUN: 18 mg/dL (ref 6–23)
CHLORIDE: 106 meq/L (ref 96–112)
CO2: 24 meq/L (ref 19–32)
Calcium: 9.4 mg/dL (ref 8.4–10.5)
Creatinine, Ser: 1.2 mg/dL — ABNORMAL HIGH (ref 0.50–1.10)
GFR calc Af Amer: 51 mL/min — ABNORMAL LOW (ref >90)
GFR calc non Af Amer: 44 mL/min — ABNORMAL LOW (ref >90)
GLUCOSE: 96 mg/dL (ref 70–99)
Potassium: 4.5 mEq/L (ref 3.7–5.3)
Sodium: 139 mEq/L (ref 137–147)

## 2014-01-25 LAB — RAPID URINE DRUG SCREEN, HOSP PERFORMED
Amphetamines: NOT DETECTED
BENZODIAZEPINES: NOT DETECTED
Barbiturates: NOT DETECTED
Cocaine: NOT DETECTED
Opiates: NOT DETECTED
Tetrahydrocannabinol: NOT DETECTED

## 2014-01-25 LAB — URINE MICROSCOPIC-ADD ON

## 2014-01-25 LAB — URINALYSIS, ROUTINE W REFLEX MICROSCOPIC
Bilirubin Urine: NEGATIVE
GLUCOSE, UA: NEGATIVE mg/dL
Hgb urine dipstick: NEGATIVE
KETONES UR: NEGATIVE mg/dL
Nitrite: NEGATIVE
Protein, ur: NEGATIVE mg/dL
Specific Gravity, Urine: 1.015 (ref 1.005–1.030)
Urobilinogen, UA: 1 mg/dL (ref 0.0–1.0)
pH: 5.5 (ref 5.0–8.0)

## 2014-01-25 LAB — PROTIME-INR
INR: 1.44 (ref 0.00–1.49)
Prothrombin Time: 17.2 seconds — ABNORMAL HIGH (ref 11.6–15.2)

## 2014-01-25 MED ORDER — LORAZEPAM 2 MG/ML IJ SOLN
2.0000 mg | Freq: Once | INTRAMUSCULAR | Status: AC
  Administered 2014-01-25: 2 mg via INTRAMUSCULAR
  Filled 2014-01-25: qty 1

## 2014-01-25 MED ORDER — HALOPERIDOL LACTATE 5 MG/ML IJ SOLN
5.0000 mg | Freq: Once | INTRAMUSCULAR | Status: AC
  Administered 2014-01-25: 5 mg via INTRAMUSCULAR
  Filled 2014-01-25: qty 1

## 2014-01-25 MED ORDER — ACETAMINOPHEN 325 MG PO TABS
325.0000 mg | ORAL_TABLET | Freq: Once | ORAL | Status: DC
  Filled 2014-01-25: qty 1

## 2014-01-25 MED ORDER — WARFARIN - PHARMACIST DOSING INPATIENT: Freq: Every day | Status: DC

## 2014-01-25 MED ORDER — LORAZEPAM 2 MG/ML IJ SOLN
INTRAMUSCULAR | Status: AC
  Administered 2014-01-25: 1 mg
  Filled 2014-01-25: qty 1

## 2014-01-25 NOTE — Discharge Instructions (Signed)
Adjustment Disorder Most changes in life can cause stress. Getting used to changes may take a few months or longer. If feelings of stress, hopelessness, or worry continue, you may have an adjustment disorder. This stress-related mental health problem may affect your feelings, thinking and how you act. It occurs in both sexes and happens at any age. SYMPTOMS  Some of the following problems may be seen and vary from person to person:  Sadness or depression.  Loss of enjoyment.  Thoughts of suicide.  Fighting.  Avoiding family and friends.  Poor school performance.  Hopelessness, sense of loss.  Trouble sleeping.  Vandalism.  Worry, weight loss or gain.  Crying spells.  Anxiety  Reckless driving.  Skipping school.  Poor work Systems analyst.  Nervousness.  Ignoring bills.  Poor attitude. DIAGNOSIS  Your caregiver will ask what has happened in your life and do a physical exam. They will make a diagnosis of an adjustment disorder when they are sure another problem or medical illness causing your feelings does not exist. TREATMENT  When problems caused by stress interfere with you daily life or last longer than a few months, you may need counseling for an adjustment disorder. Early treatment may diminish problems and help you to better cope with the stressful events in your life. Sometimes medication is necessary. Individual counseling and or support groups can be very helpful. PROGNOSIS  Adjustment disorders usually last less than 3 to 6 months. The condition may persist if there is long lasting stress. This could include health problems, relationship problems, or job difficulties where you can not easily escape from what is causing the problem. PREVENTION  Even the most mentally healthy, highly functioning people can suffer from an adjustment disorder given a significant blow from a life-changing event. There is no way to prevent pain and loss. Most people need help from time  to time. You are not alone. SEEK MEDICAL CARE IF:  Your feelings or symptoms listed above do not improve or worsen. Document Released: 04/21/2006 Document Revised: 11/09/2011 Document Reviewed: 07/13/2007 [facility] Patient Information 2014 [place], [place].  Confusion Confusion is the inability to think with your usual speed or clarity. Confusion may come on quickly or slowly over time. How quickly the confusion comes on depends on the cause. Confusion can be due to any number of causes. CAUSES   Concussion, head injury, or head trauma.  Seizures.  Stroke.  Fever.  Senility.  Heightened emotional states like rage or terror.  Mental illness in which the person loses the ability to determine what is real and what is not (hallucinations).  Infections.  Toxic effects from alcohol, drugs, or prescription medicines.  Dehydration and an imbalance of salts in the body (electrolytes).  Lack of sleep.  Low blood sugar (diabetes).  Low levels of oxygen (for example from chronic lung disorders).  Drug interactions or other medication side effects.  Nutritional deficiencies, especially niacin, thiamine, vitamin C, or vitamin B.  Sudden drop in body temperature (hypothermia).  Illness in the elderly. Constipation can result in confusion. An elderly person who is hospitalized may become confused due to change in daily routine. SYMPTOMS  People often describe their thinking as cloudy or unclear when they are confused. Confusion can also include feeling disoriented. That means you are unaware of where or who you are. You may also not know what the date or time is. If confused, you may also have difficulty paying attention, remembering and making decisions. Some people also act aggressively when they  are confused.  DIAGNOSIS  The medical evaluation of confusion may include:  Blood and urine tests.  X-rays.  Brain and nervous system tests.  Analyzing your brain waves  (electroencphalogram or EEG).  A special X-ray (MRI) of your head or other special studies. Your physician will ask questions such as:  Do you get days and nights mixed up?  Are you awake during regular sleep times?  Do you have trouble recognizing people?  Do you know where you are?  Do you know the date and time?  Does the confusion come and go?  Is the confusion quickly getting worse?  Has there been a recent illness?  Has there been a recent head injury?  Are you diabetic?  Do you have a lung disorder?  What medication are you taking?  Have you taken drugs or alcohol? TREATMENT  An admission to the hospital may not be needed, but a confused person should not be left alone. Stay with a family member or friend until the confusion clears. Avoid alcohol, pain relievers or sedative drugs until you have fully recovered. Do not drive until your caregiver says it is okay. HOME CARE INSTRUCTIONS What family and friends can do:  To find out if someone is confused ask him or her their name, age, and the date. If the person is unsure or answers incorrectly, he or she is confused.  Always introduce yourself, no matter how well the person knows you.  Often remind the person of his or her location.  Place a calendar and clock near the confused person.  Talk about current events and plans for the day.  Try to keep the environment calm, quiet and peaceful.  Make sure the patient keeps follow up appointments with their physician. PREVENTION  Ways to prevent confusion:  Avoid alcohol.  Eat a balanced diet.  Get enough sleep.  Do not become isolated. Spend time with other people and make plans for your days.  Keep careful watch on your blood sugar levels if you are diabetic. SEEK IMMEDIATE MEDICAL CARE IF:   You develop severe headaches, repeated vomiting, seizures, blackouts or slurred speech.  There is increasing confusion, weakness, numbness, restlessness or  personality changes.  You develop a loss of balance, have marked dizziness, feel uncoordinated or fall.  You have delusions, hallucinations or develop severe anxiety.  Your family members think you need to be rechecked. Document Released: 09/24/2004 Document Revised: 11/09/2011 Document Reviewed: 05/22/2008 [facility] Patient Information 2014 [place], [place].

## 2014-01-25 NOTE — Progress Notes (Signed)
CSW spoke with the patient's daughter to inform her of the placement confirmed with [facility].  CSW provided the daughter with contact information and reassured her transportation will not til the morning.     [name], MSW, [place], 01/25/2014 Evening Clinical Social Worker [phone]

## 2014-01-25 NOTE — ED Notes (Signed)
Charge rn spoke to [facility], pt to be evaluated by psych this morning.

## 2014-01-25 NOTE — ED Notes (Signed)
Nurse has been notified of low O2 stats.

## 2014-01-25 NOTE — ED Notes (Addendum)
Patient able to eat lunch, ambulate without walker. Walked to secure hold bed 35 with NT in purple scrubs. No issues noted.

## 2014-01-25 NOTE — Progress Notes (Signed)
ANTICOAGULATION CONSULT NOTE - Initial Consult  Pharmacy Consult for warfarin Indication: DVT  No Known Allergies  Patient Measurements:   Heparin Dosing Weight:   Vital Signs: Temp: 98 F (36.7 C) (05/27 2147) Temp src: Oral (05/27 2147) BP: 98/58 mmHg (05/28 0006) Pulse Rate: 68 (05/28 0006)  Labs:  Recent Labs  01/24/14 1845 01/24/14 2253  HGB 14.2  --   HCT 43.0  --   PLT 235  --   LABPROT  --  17.3*  INR  --  1.45  CREATININE 1.53*  --     The CrCl is unknown because both a height and weight (above a minimum accepted value) are required for this calculation.   Medical History: Past Medical History  Diagnosis Date  . Dyspnea   . Pulmonary embolism   . Breast cyst     right  . Hypothyroidism   . Renal cyst   . Leukocytopenia   . Monoclonal gammopathy   . Osteoarthritis   . Schizoaffective disorder   . Wears dentures   . Pulmonary hypertension     Medications:   (Not in a hospital admission) Scheduled:  . B-complex with vitamin C  1 tablet Oral Daily  . bosentan  125 mg Oral BID  . calcium-vitamin D  1 tablet Oral BID  . levothyroxine  112 mcg Oral QAC breakfast  . lithium carbonate  150 mg Oral QHS  . multivitamin with minerals  1 tablet Oral q morning - 10a  . spironolactone  50 mg Oral Daily  . Tadalafil (PAH)  40 mg Oral Daily  . temazepam  15 mg Oral QHS  . vitamin C  500 mg Oral Daily  . warfarin  7.5 mg Oral q1800  . Warfarin - Pharmacist Dosing Inpatient   Does not apply q1800    Assessment: Patient on warfarin VTE treatment.  INR <2 on admit.  Spoke with Dr. Jeneen Rinks, wished for pharmacy to dose.  He was aware that at this time patient was not providing medication history.  Goal of Therapy:  INR 2-3    Plan:  Start with Coumadin 7.5 mg tonight and then daily at 1800 Check PT/INR daily. Provide Coumadin education.   Laneah Luft [employer]. 01/25/2014,12:10 AM

## 2014-01-25 NOTE — Progress Notes (Signed)
Psychiatrist spoke with outpatient psychiatrist, who had spoken with [facility] and has bed for patient. Pt is pending CXR, UA, and EKG for review and possible bed for today. CSW updated RN who will be updating patient. Patient to be placed under IVC again.   Noreene Larsson [phone]  ED CSW 01/25/2014 1233pm

## 2014-01-25 NOTE — Consult Note (Signed)
[facility] Face-to-Face Psychiatry Consult   Reason for Consult:  Adjustment disorder Referring Physician:  EDP  Michaela Horton is an 73 y.o. female. Total Time spent with patient: 45 minutes  Assessment: AXIS I:  Adjustment Disorder with Disturbance of Conduct AXIS II:  Deferred AXIS III:   Past Medical History  Diagnosis Date  . Dyspnea   . Pulmonary embolism   . Breast cyst     right  . Hypothyroidism   . Renal cyst   . Leukocytopenia   . Monoclonal gammopathy   . Osteoarthritis   . Schizoaffective disorder   . Wears dentures   . Pulmonary hypertension    AXIS IV:  other psychosocial or environmental problems AXIS V:  61-70 mild symptoms  Plan:  No evidence of imminent risk to self or others at present.   Patient does not meet criteria for psychiatric inpatient admission. Supportive therapy provided about ongoing stressors. Discussed crisis plan, support from social network, calling 911, coming to the Emergency Department, and calling Suicide Hotline.  Subjective:   Michaela Horton is a 73 y.o. female patient.  HPI:  Patient presents to [facility] via IVC by her daughter.  Also noted that Dr. Rosaland Lao wanted inpatient related to patient being belligerent during inpatient visit.  Patient states "You need to try and get my daughter to get some help; she the one need to be on some medication; I'm fine.  Yes I have a nurse to come help me; Ms Enid Derry; but my daughter is worrying the shit out of me. I'm here cause she decided that I needed to be here; she trying to make me look like I'm incompetent." During interview patient used multiple curse words while talking.  Patient states that she has always been a cursor.  Appears that using curse words is a daily thing for patient.  Patient is aware of where she is, name, date of birth, age...  Patient denies depression, psychosis, and paranoia.  Patient states that her only problem her daughter which is similar to the last presentation of patient  being seen in the ED.  Patient was calm and cooperative during interview and answered question appropriately.  Patient laughing and joking with others  Called and left message with Dr. Casimiro Needle that patient has no criteria for inpatient treatment. We are unable to force patient to take her medications, patient has the daughter and a nurse that comes into home to assist patient.  Patient would benefit more with inpatient assisted living facility which family can work on from home.    Spoke with Dr. Tomi Bamberger related to patients Labs.  Dr. Tomi Bamberger will repeat ISTAT-8 and will also put in medical referrals needed for patient to follow up after discharge.   HPI Elements:   Location:  Adjustment disorder. Quality:  forgetfulness. Severity:  forgetfulness. Duration:  several weeks. Review of Systems  Constitutional: Negative for chills.  Gastrointestinal: Negative for nausea and vomiting.  Musculoskeletal: Positive for joint pain. Negative for falls.  Neurological: Negative for headaches.  Psychiatric/Behavioral: Negative for depression, suicidal ideas, hallucinations and substance abuse. The patient is nervous/anxious. The patient does not have insomnia.     Past Psychiatric History: Past Medical History  Diagnosis Date  . Dyspnea   . Pulmonary embolism   . Breast cyst     right  . Hypothyroidism   . Renal cyst   . Leukocytopenia   . Monoclonal gammopathy   . Osteoarthritis   . Schizoaffective disorder   . Wears dentures   .  Pulmonary hypertension     reports that she has never smoked. She has never used smokeless tobacco. She reports that she does not drink alcohol or use illicit drugs. Family History  Problem Relation Age of Onset  . Cancer Father     PROSTATE  . Atrial fibrillation Mother   . Alzheimer's disease Mother    Family History Substance Abuse: No Family Supports: Yes, List: (Daughter ) Living Arrangements: Alone Can pt return to current living arrangement?:  No Abuse/Neglect [facility]) Physical Abuse: Denies Verbal Abuse: Denies Sexual Abuse: Denies Allergies:  No Known Allergies  ACT Assessment Complete:  Yes:    Educational Status    Risk to Self: Risk to self Suicidal Ideation: No Suicidal Intent: No Is patient at risk for suicide?: No Suicidal Plan?: No Access to Means: No What has been your use of drugs/alcohol within the last 12 months?: None  Previous Attempts/Gestures: No How many times?: 0 Other Self Harm Risks: None  Triggers for Past Attempts: None known Intentional Self Injurious Behavior: None Family Suicide History: No Recent stressful life event(s): Other (Comment) (Chronic Mental Health) Persecutory voices/beliefs?: No Depression: Yes Depression Symptoms: Feeling angry/irritable Substance abuse history and/or treatment for substance abuse?: No Suicide prevention information given to non-admitted patients: Not applicable  Risk to Others: Risk to Others Homicidal Ideation: No Thoughts of Harm to Others: No Current Homicidal Intent: No Current Homicidal Plan: No Access to Homicidal Means: No Identified Victim: None  History of harm to others?: No Assessment of Violence: None Noted Violent Behavior Description: None  Does patient have access to weapons?: No Criminal Charges Pending?: No Does patient have a court date: No  Abuse: Abuse/Neglect Assessment (Assessment to be complete while patient is alone) Physical Abuse: Denies Verbal Abuse: Denies Sexual Abuse: Denies Exploitation of patient/patient's resources: Denies Self-Neglect: Denies  Prior Inpatient Therapy: Prior Inpatient Therapy Prior Inpatient Therapy: Yes Prior Therapy Dates: 2008 Prior Therapy Facilty/Provider(s): [facility], [place], [place]  Reason for Treatment: Mental Illness   Prior Outpatient Therapy: Prior Outpatient Therapy Prior Outpatient Therapy: Yes Prior Therapy Dates: Current  Prior Therapy Facilty/Provider(s): [facility]   Reason for Treatment: Med Mgt/Therapy   Additional Information: Additional Information 1:1 In Past 12 Months?: No CIRT Risk: No Elopement Risk: No Does patient have medical clearance?: Yes    Objective: Blood pressure 129/87, pulse 79, temperature 97.9 F (36.6 C), temperature source Oral, resp. rate 20, SpO2 96.00%.There is no weight on file to calculate BMI. Results for orders placed during the hospital encounter of 01/24/14 (from the past 72 hour(s))  CBC WITH DIFFERENTIAL     Status: None   Collection Time    01/24/14  6:45 PM      Result Value Ref Range   WBC 4.8  4.0 - 10.5 K/uL   RBC 4.60  3.87 - 5.11 MIL/uL   Hemoglobin 14.2  12.0 - 15.0 g/dL   HCT 43.0  36.0 - 46.0 %   MCV 93.5  78.0 - 100.0 fL   MCH 30.9  26.0 - 34.0 pg   MCHC 33.0  30.0 - 36.0 g/dL   RDW 14.2  11.5 - 15.5 %   Platelets 235  150 - 400 K/uL   Neutrophils Relative % 51  43 - 77 %   Neutro Abs 2.4  1.7 - 7.7 K/uL   Lymphocytes Relative 40  12 - 46 %   Lymphs Abs 1.9  0.7 - 4.0 K/uL   Monocytes Relative 6  3 -  12 %   Monocytes Absolute 0.3  0.1 - 1.0 K/uL   Eosinophils Relative 2  0 - 5 %   Eosinophils Absolute 0.1  0.0 - 0.7 K/uL   Basophils Relative 1  0 - 1 %   Basophils Absolute 0.1  0.0 - 0.1 K/uL  COMPREHENSIVE METABOLIC PANEL     Status: Abnormal   Collection Time    01/24/14  6:45 PM      Result Value Ref Range   Sodium 138  137 - 147 mEq/L   Potassium 4.1  3.7 - 5.3 mEq/L   Chloride 103  96 - 112 mEq/L   CO2 21  19 - 32 mEq/L   Glucose, Bld 133 (*) 70 - 99 mg/dL   BUN 20  6 - 23 mg/dL   Creatinine, Ser 5.64 (*) 0.50 - 1.10 mg/dL   Calcium 33.2  8.4 - 95.1 mg/dL   Total Protein 8.4 (*) 6.0 - 8.3 g/dL   Albumin 3.8  3.5 - 5.2 g/dL   AST 23  0 - 37 U/L   ALT 21  0 - 35 U/L   Alkaline Phosphatase 54  39 - 117 U/L   Total Bilirubin 0.7  0.3 - 1.2 mg/dL   GFR calc non Af Amer 33 (*) >90 mL/min   GFR calc Af Amer 38 (*) >90 mL/min   Comment: (NOTE)     The eGFR has been calculated  using the CKD EPI equation.     This calculation has not been validated in all clinical situations.     eGFR's persistently <90 mL/min signify possible Chronic Kidney     Disease.  LITHIUM LEVEL     Status: Abnormal   Collection Time    01/24/14  6:45 PM      Result Value Ref Range   Lithium Lvl <0.25 (*) 0.80 - 1.40 mEq/L  ACETAMINOPHEN LEVEL     Status: None   Collection Time    01/24/14  6:45 PM      Result Value Ref Range   Acetaminophen (Tylenol), Serum <15.0  10 - 30 ug/mL   Comment:            THERAPEUTIC CONCENTRATIONS VARY     SIGNIFICANTLY. A RANGE OF 10-30     ug/mL MAY BE AN EFFECTIVE     CONCENTRATION FOR MANY PATIENTS.     HOWEVER, SOME ARE BEST TREATED     AT CONCENTRATIONS OUTSIDE THIS     RANGE.     ACETAMINOPHEN CONCENTRATIONS     >150 ug/mL AT 4 HOURS AFTER     INGESTION AND >50 ug/mL AT 12     HOURS AFTER INGESTION ARE     OFTEN ASSOCIATED WITH TOXIC     REACTIONS.  ETHANOL     Status: None   Collection Time    01/24/14  6:45 PM      Result Value Ref Range   Alcohol, Ethyl (B) <11  0 - 11 mg/dL   Comment:            LOWEST DETECTABLE LIMIT FOR     SERUM ALCOHOL IS 11 mg/dL     FOR MEDICAL PURPOSES ONLY  SALICYLATE LEVEL     Status: Abnormal   Collection Time    01/24/14  6:45 PM      Result Value Ref Range   Salicylate Lvl <2.0 (*) 2.8 - 20.0 mg/dL  PROTIME-INR     Status: Abnormal   Collection Time  01/24/14 10:53 PM      Result Value Ref Range   Prothrombin Time 17.3 (*) 11.6 - 15.2 seconds   INR 1.45  0.00 - 1.49  URINE RAPID DRUG SCREEN (HOSP PERFORMED)     Status: None   Collection Time    01/25/14  4:52 AM      Result Value Ref Range   Opiates NONE DETECTED  NONE DETECTED   Cocaine NONE DETECTED  NONE DETECTED   Benzodiazepines NONE DETECTED  NONE DETECTED   Amphetamines NONE DETECTED  NONE DETECTED   Tetrahydrocannabinol NONE DETECTED  NONE DETECTED   Barbiturates NONE DETECTED  NONE DETECTED   Comment:            DRUG SCREEN FOR  MEDICAL PURPOSES     ONLY.  IF CONFIRMATION IS NEEDED     FOR ANY PURPOSE, NOTIFY LAB     WITHIN 5 DAYS.                LOWEST DETECTABLE LIMITS     FOR URINE DRUG SCREEN     Drug Class       Cutoff (ng/mL)     Amphetamine      1000     Barbiturate      200     Benzodiazepine   620     Tricyclics       355     Opiates          300     Cocaine          300     THC              50  PROTIME-INR     Status: Abnormal   Collection Time    01/25/14  6:20 AM      Result Value Ref Range   Prothrombin Time 17.2 (*) 11.6 - 15.2 seconds   INR 1.44  0.00 - 1.49   Labs are reviewed no critical values.  Current Facility-Administered Medications  Medication Dose Route Frequency Provider Last Rate Last Dose  . B-complex with vitamin C tablet 1 tablet  1 tablet Oral Daily Tanna Furry, MD   1 tablet at 01/25/14 1145  . bosentan (TRACLEER) tablet 125 mg  125 mg Oral BID Tanna Furry, MD      . calcium-vitamin D (OSCAL WITH D) 500-200 MG-UNIT per tablet 1 tablet  1 tablet Oral BID Tanna Furry, MD   1 tablet at 01/25/14 1013  . furosemide (LASIX) tablet 20 mg  20 mg Oral Daily PRN Tanna Furry, MD      . levothyroxine (SYNTHROID, LEVOTHROID) tablet 112 mcg  112 mcg Oral QAC breakfast Tanna Furry, MD   112 mcg at 01/25/14 0809  . lithium carbonate capsule 150 mg  150 mg Oral QHS Tanna Furry, MD      . LORazepam (ATIVAN) tablet 1 mg  1 mg Oral BID PRN Tanna Furry, MD      . multivitamin with minerals tablet 1 tablet  1 tablet Oral q morning - 10a Tanna Furry, MD   1 tablet at 01/25/14 1013  . spironolactone (ALDACTONE) tablet 50 mg  50 mg Oral Daily Tanna Furry, MD      . Tadalafil (PAH) TABS 40 mg  40 mg Oral Daily Tanna Furry, MD      . temazepam (RESTORIL) capsule 15 mg  15 mg Oral QHS Tanna Furry, MD      . vitamin C (ASCORBIC ACID) tablet  500 mg  500 mg Oral Daily Tanna Furry, MD   500 mg at 01/25/14 1145  . warfarin (COUMADIN) tablet 7.5 mg  7.5 mg Oral q1800 Tanna Furry, MD      . Warfarin - Pharmacist Dosing  Inpatient   Does not apply q1800 Tanna Furry, MD       Current Outpatient Prescriptions  Medication Sig Dispense Refill  . ABILIFY 5 MG tablet Take 1 tablet by mouth daily.      . B Complex-C (B-COMPLEX WITH VITAMIN C) tablet Take 1 tablet by mouth daily.       Marland Kitchen bosentan (TRACLEER) 125 MG tablet Take 125 mg by mouth 2 (two) times daily.      . calcium carbonate (OS-CAL) 600 MG TABS tablet Take 600 mg by mouth 2 (two) times daily with a meal.      . furosemide (LASIX) 20 MG tablet Take 20 mg by mouth daily as needed for fluid.       Marland Kitchen levothyroxine (SYNTHROID, LEVOTHROID) 112 MCG tablet Take 112 mcg by mouth daily before breakfast.       . lithium carbonate 150 MG capsule Take 1 capsule by mouth at bedtime.      Marland Kitchen LORazepam (ATIVAN) 1 MG tablet Take 1 tablet by mouth 2 (two) times daily as needed for anxiety or sleep.       . Multiple Vitamin (MULTIVITAMIN WITH MINERALS) TABS tablet Take 1 tablet by mouth every morning.      Marland Kitchen PRESCRIPTION MEDICATION See admin instructions. T-DAP vaccine was given in pharmacy on 5/18      . spironolactone (ALDACTONE) 50 MG tablet Take 50 mg by mouth daily.       . Tadalafil, PAH, (ADCIRCA) 20 MG TABS Take 40 mg by mouth at bedtime.      . temazepam (RESTORIL) 15 MG capsule Take 30-45 mg by mouth at bedtime.       . vitamin C (ASCORBIC ACID) 500 MG tablet Take 500 mg by mouth daily.       Marland Kitchen warfarin (COUMADIN) 7.5 MG tablet Take 7.5 mg by mouth daily.        Psychiatric Specialty Exam:     Blood pressure 129/87, pulse 79, temperature 97.9 F (36.6 C), temperature source Oral, resp. rate 20, SpO2 96.00%.There is no weight on file to calculate BMI.  General Appearance: Casual and Fairly Groomed  Eye Contact::  Good  Speech:  Clear and Coherent and Normal Rate  Volume:  Normal  Mood:  Anxious and Happy, smiling.  "I feel good"  Affect:  Congruent  Thought Process:  Circumstantial  Orientation:  Full (Time, Place, and Person)  Thought Content:  Rumination   Suicidal Thoughts:  No  Homicidal Thoughts:  No  Memory:  Immediate;   Poor Recent;   Poor Remote;   Poor  Judgement:  Impaired  Insight:  Present  Psychomotor Activity:  Normal  Concentration:  Fair  Recall:  Poor  Fund of Knowledge:Good  Language: Good  Akathisia:  No  Handed:  Right  AIMS (if indicated):     Assets:  Communication Skills Desire for Improvement Housing  Sleep:      Musculoskeletal: Strength & Muscle Tone: within normal limits Gait & Station: normal Patient leans: N/A  Treatment Plan Summary: Follow up with Dr. Casimiro Needle and medical doctor  Disposition:  Discharge home to follow up with outpatient providers  Debborah Alonge, FNP-BC 01/25/2014 11:49 AM

## 2014-01-25 NOTE — Consult Note (Signed)
Face to face evaluation and I agree with this note 

## 2014-01-25 NOTE — ED Notes (Signed)
Psyche at bedside

## 2014-01-25 NOTE — ED Notes (Signed)
Per daughter, patient's psychiatrist is trying to get her a ed at [facility]

## 2014-01-25 NOTE — ED Notes (Signed)
Pt. Alert and active. Pt denies SI/HI/AVH. Pt. Verbalized having thoughts of hurting her children without intend. Patient appears anxious. Patient refused to stay in her room, pacing and yelling "get me out of this place now". Pt. continiously banging on the nurses's station window and exit door. Pt. Refused verbal redirection. Pt. Escorted to her room by staff. Pt continues to pace, yell, cursing and threatening. Will continue to monitor.

## 2014-01-25 NOTE — [facility] Suicide Risk Assessment (Cosign Needed)
Suicide Risk Assessment  Discharge Assessment     Demographic Factors:  female  Total Time spent with patient: 15 minutes  Psychiatric Specialty Exam:     Blood pressure 129/75, pulse 66, temperature 97.9 F (36.6 C), temperature source Oral, resp. rate 16, SpO2 93.00%.There is no weight on file to calculate BMI.  General Appearance: Casual and Fairly Groomed  Eye Contact::  Good  Speech:  Clear and Coherent and Normal Rate  Volume:  Normal  Mood:  Anxious and Happy, smiling. "I feel good"  Affect:  Congruent  Thought Process:  Circumstantial  Orientation:  Full (Time, Place, and Person)  Thought Content:  Rumination  Suicidal Thoughts:  No  Homicidal Thoughts:  No  Memory:  Immediate;   Poor Recent;   Poor Remote;   Poor  Judgement:  Impaired  Insight:  Present  Psychomotor Activity:  Normal  Concentration:  Fair  Recall:  Poor  Fund of Knowledge:Good  Language: Good  Akathisia:  No  Handed:  Right  AIMS (if indicated):     Assets:  Communication Skills Desire for Improvement Housing  Sleep:       Musculoskeletal: Strength & Muscle Tone: within normal limits Gait & Station: normal Patient leans: N/A   Mental Status Per Nursing Assessment::   On Admission:     Current Mental Status by Physician: Patient deneis suicidal/homicidal ideation, psychosis, and paranoia  Loss Factors: NA  Historical Factors: NA  Risk Reduction Factors:   Positive social support  Continued Clinical Symptoms:  Cognitive changes (forgetfulness)  Cognitive Features That Contribute To Risk:  Loss of executive function    Suicide Risk:  Minimal: No identifiable suicidal ideation.  Patients presenting with no risk factors but with morbid ruminations; may be classified as minimal risk based on the severity of the depressive symptoms  Discharge Diagnoses:   AXIS I:  Adjustment Disorder with Disturbance of Conduct AXIS II:  Deferred AXIS III:   Past Medical History   Diagnosis Date  . Dyspnea   . Pulmonary embolism   . Breast cyst     right  . Hypothyroidism   . Renal cyst   . Leukocytopenia   . Monoclonal gammopathy   . Osteoarthritis   . Schizoaffective disorder   . Wears dentures   . Pulmonary hypertension    AXIS IV:  other psychosocial or environmental problems and problems with primary support group AXIS V:  61-70 mild symptoms  Plan Of Care/Follow-up recommendations:  Activity:  Resume usual activity Diet:  Resume usual diet  Is patient on multiple antipsychotic therapies at discharge:  No   Has Patient had three or more failed trials of antipsychotic monotherapy by history:  No  Recommended Plan for Multiple Antipsychotic Therapies: NA   Addendum:  Dr. Casimiro Needle called and stated that he would really like for patient to go to [facility] inpatient for treatment based on what he has been seen.  States that he is aware that patient is able to "pull it together rather well but based on her action here at my office during visits and at home the recommendation is inpatient."  Dr. Casimiro Needle also states that he has spoken to a Dr. Ronnald Ramp at [facility] and the patient has been accepted pending medical clearance and a bed is being held for her.    Haroldine Redler, FNP-BC 01/25/2014, 12:36 PM

## 2014-01-25 NOTE — ED Notes (Signed)
Patient at nurses station demanding to leave and use a phone. Patient banging fists against nurses's station door.   Encouragement offered. Escorted to x-ray with MHT and security.

## 2014-01-25 NOTE — ED Notes (Signed)
Pt. Awake, pacing the unit. Appears anxious and agitated. Refused to stay in her bedroom. Will continue to monitor patient.

## 2014-01-25 NOTE — ED Notes (Signed)
I had talked to the psych PA earlier and she was going to be discharged, they were asking if her labs were okay for discharge. She did have a mild increase in her basline renal insufficiency, so a BMET was repeated which shows she has gone back down to her baseline. However, she has now been accepted at [facility] and is going to be transferred there.    Results for orders placed during the hospital encounter of 01/24/14  CBC WITH DIFFERENTIAL      Result Value Ref Range   WBC 4.8  4.0 - 10.5 K/uL   RBC 4.60  3.87 - 5.11 MIL/uL   Hemoglobin 14.2  12.0 - 15.0 g/dL   HCT 43.0  36.0 - 46.0 %   MCV 93.5  78.0 - 100.0 fL   MCH 30.9  26.0 - 34.0 pg   MCHC 33.0  30.0 - 36.0 g/dL   RDW 14.2  11.5 - 15.5 %   Platelets 235  150 - 400 K/uL   Neutrophils Relative % 51  43 - 77 %   Neutro Abs 2.4  1.7 - 7.7 K/uL   Lymphocytes Relative 40  12 - 46 %   Lymphs Abs 1.9  0.7 - 4.0 K/uL   Monocytes Relative 6  3 - 12 %   Monocytes Absolute 0.3  0.1 - 1.0 K/uL   Eosinophils Relative 2  0 - 5 %   Eosinophils Absolute 0.1  0.0 - 0.7 K/uL   Basophils Relative 1  0 - 1 %   Basophils Absolute 0.1  0.0 - 0.1 K/uL  COMPREHENSIVE METABOLIC PANEL      Result Value Ref Range   Sodium 138  137 - 147 mEq/L   Potassium 4.1  3.7 - 5.3 mEq/L   Chloride 103  96 - 112 mEq/L   CO2 21  19 - 32 mEq/L   Glucose, Bld 133 (*) 70 - 99 mg/dL   BUN 20  6 - 23 mg/dL   Creatinine, Ser 1.53 (*) 0.50 - 1.10 mg/dL   Calcium 10.0  8.4 - 10.5 mg/dL   Total Protein 8.4 (*) 6.0 - 8.3 g/dL   Albumin 3.8  3.5 - 5.2 g/dL   AST 23  0 - 37 U/L   ALT 21  0 - 35 U/L   Alkaline Phosphatase 54  39 - 117 U/L   Total Bilirubin 0.7  0.3 - 1.2 mg/dL   GFR calc non Af Amer 33 (*) >90 mL/min   GFR calc Af Amer 38 (*) >90 mL/min  LITHIUM LEVEL      Result Value Ref Range   Lithium Lvl <0.25 (*) 0.80 - 1.40 mEq/L  URINE RAPID DRUG SCREEN (HOSP PERFORMED)      Result Value Ref Range   Opiates NONE DETECTED  NONE DETECTED   Cocaine NONE  DETECTED  NONE DETECTED   Benzodiazepines NONE DETECTED  NONE DETECTED   Amphetamines NONE DETECTED  NONE DETECTED   Tetrahydrocannabinol NONE DETECTED  NONE DETECTED   Barbiturates NONE DETECTED  NONE DETECTED  ACETAMINOPHEN LEVEL      Result Value Ref Range   Acetaminophen (Tylenol), Serum <15.0  10 - 30 ug/mL  ETHANOL      Result Value Ref Range   Alcohol, Ethyl (B) <11  0 - 11 mg/dL  SALICYLATE LEVEL      Result Value Ref Range   Salicylate Lvl <6.9 (*) 2.8 - 20.0 mg/dL  PROTIME-INR  Result Value Ref Range   Prothrombin Time 17.3 (*) 11.6 - 15.2 seconds   INR 1.45  0.00 - 1.49  PROTIME-INR      Result Value Ref Range   Prothrombin Time 17.2 (*) 11.6 - 15.2 seconds   INR 1.44  0.00 - 9.93  BASIC METABOLIC PANEL      Result Value Ref Range   Sodium 139  137 - 147 mEq/L   Potassium 4.5  3.7 - 5.3 mEq/L   Chloride 106  96 - 112 mEq/L   CO2 24  19 - 32 mEq/L   Glucose, Bld 96  70 - 99 mg/dL   BUN 18  6 - 23 mg/dL   Creatinine, Ser 1.20 (*) 0.50 - 1.10 mg/dL   Calcium 9.4  8.4 - 10.5 mg/dL   GFR calc non Af Amer 44 (*) >90 mL/min   GFR calc Af Amer 51 (*) >90 mL/min     Rolland Porter, MD, Abram Sander      Janice Norrie, MD 01/25/14 1315

## 2014-01-25 NOTE — ED Notes (Signed)
Call from social work: plan is to re-IVC patient and there is now a room reserved in [place]. Patient's daughter aware of plan. Patient is A&O x4, calm and ambulatory. Report called to [facility], Therapist, sports. Per Agricultural consultant, patient is to be moved to secure hold ED.

## 2014-01-25 NOTE — BH Assessment (Signed)
[place] Assessment Progress Note      Pt was evaluated by Dr Donnelly Angelica, ED psychiatrist.  She was cleared for discharge home.  Her IVC was rescinded by Dr Lovena Le and the notice of commitment change was faxed to the magistrate and the clerk of court for [facility]

## 2014-01-25 NOTE — ED Notes (Signed)
Attempted calling social work regarding transfer to [facility]. Confidential voicemail left.

## 2014-01-25 NOTE — ED Notes (Signed)
Patient has been agitated, labile, verbally aggressive. Tangential. Continues to try to leave, using call bell to try and get "911 to take me home". Keeps asking for her son to pick her up. Complaining of left hip pain. Patient spoke briefly with her daughter and grandson. Was heard cursing and telling them to come get her.  Patient safety maintained, Q 15 checks continue.

## 2014-01-25 NOTE — ED Notes (Signed)
Patient escorted to room 34. Remained upset. Encouragement offered.   Patient in bed. Resting quietly.  Q 15 safety checks in place.

## 2014-01-25 NOTE — BH Assessment (Addendum)
[place] Assessment Progress Note     Pt was accepted to [facility].  Prior to discharge, Dr Lovena Le spoke with the patient's outpatient psychiatrist, Norma Fredrickson, who reported he had already arranged a bed at [facility] pending medical clearance.  This Probation officer confirmed this information with Shirlee Limerick and appropriate paperwork was faxed.  Pt accepted to the service of Dr Ronnald Ramp at [facility] [facility].  Called Sgt Pascall to arrange transport.  Left message at 16:40.  IVC reinitiated by Dr Lovena Le and Findings and Custody have been served.

## 2014-03-23 ENCOUNTER — Encounter: Payer: Self-pay | Admitting: Internal Medicine

## 2014-03-23 ENCOUNTER — Ambulatory Visit (INDEPENDENT_AMBULATORY_CARE_PROVIDER_SITE_OTHER): Payer: Medicare Other | Admitting: Internal Medicine

## 2014-03-23 VITALS — BP 138/86 | HR 85 | Temp 97.7°F | Ht 61.5 in | Wt 140.0 lb

## 2014-03-23 DIAGNOSIS — J9611 Chronic respiratory failure with hypoxia: Secondary | ICD-10-CM

## 2014-03-23 DIAGNOSIS — I2789 Other specified pulmonary heart diseases: Secondary | ICD-10-CM

## 2014-03-23 DIAGNOSIS — I2724 Chronic thromboembolic pulmonary hypertension: Secondary | ICD-10-CM

## 2014-03-23 DIAGNOSIS — I2699 Other pulmonary embolism without acute cor pulmonale: Secondary | ICD-10-CM

## 2014-03-23 DIAGNOSIS — R0902 Hypoxemia: Secondary | ICD-10-CM

## 2014-03-23 DIAGNOSIS — J9610 Chronic respiratory failure, unspecified whether with hypoxia or hypercapnia: Secondary | ICD-10-CM

## 2014-03-23 NOTE — Assessment & Plan Note (Addendum)
-   PE Dx 05/2010 VQ  11/05/10  Mod to high prob PE > referred to [facility] 12/19/2010  And started on 02 24 h/day  - Venous dopplers neg 08/19/2010  - Echocardiogram 11/28/10:  Severely elevated RV sys pressure with dilated R Ht est at 99 with nl LV  All rx except 02 per [facility]

## 2014-03-23 NOTE — Progress Notes (Signed)
Subjective:    Patient ID: Michaela Horton, female    DOB: 1940/09/17    MRN: [id number]     Brief patient profile:  30 yobf never smoker dx'd with PE by CT angiogram 06/24/2010 and referred by Dr Michaela Horton to pulmonary clinic for eval of persistent doe since then with Dx of [place].   History of Present Illness  November 04, 2010 1st pulmonary office eval cc doe x 8 month no better since dx and rx of PE. doe x 50 ft  no assoc cough, mild leg swelling.  desat walking, rec v/q and repeat echo >  Mod to high prob/ severe PAH  12/19/10 ov/Michaela Horton:  Cc no change doe, returns for PFT's (minimal airflow obst with   dlco =51%).   rec You still appear to have blood blots in your lungs 6 months after you started coumadin so you need to stay on this Referred to [facility] clinic > trachleer started       09/24/2011 f/u ov/Michaela Horton cc Unchanged doe- here to recertify for o2 prn with exertion. Sats when arrived 87%ra but then improved to 93 at rest At rest not typically using any 02 but  automatically at bedtime and   On 02 2 lpm can shop at [place] s decline in ability and thinking about exercise program. No cough or leg swelling rec 02 should be worn automatically at bedtime and for any more activity than walking across a room at home  At 2lpm When really exerting yourself use 4lpm   05/18/2013 f/u ov/Michaela Horton re: 02 recert Chief Complaint  Patient presents with  . Follow-up    Pt states here to see about getting qualified for POC. Pt states that her breathing is doing well. She c/o wheezing and cough for the past 2 months. Cough is non prod.   2 lpm at rest 5 lpm with activity and can still do aisles at HT x 2 then has to rest due to sob/ fatigue No leg swelling or am ha rec 2lpm  24/7 but increase to 4lpm with any activity   10/02/2013 f/u ov/Michaela Horton re: chronic resp failure/ 02 dep secondary to [place]  Patient presents with  . Follow-up    Pt c/o increased SOB for the past 6 wks. She also  c/o fatigue. Sometimes forgets to use o2, but seems okay when she does.    Not using 02 as rec ? 4lpm when she thinks to put  it on rather than 2/4 as rec previously, denies getting any written instructions from [facility] clinic  rec Please see patient coordinator before you leave today  to schedule ono 2lpm - otherwise keep you 02 on 4lpm 24/7 All pulmonary follow up at [facility] - return here as needed    03/23/2014 f/u ov/Michaela Horton re: on 2lpm "24/7" Chief Complaint  Patient presents with  . Follow-up    Pt states that her breathing is doing well. She denies any new co's today.   when walks outside doesn't always use 02  About half the time at rest doesn't [name] it  it but when she does it's  2lpm  Every night at hs 2lpm and never increased to 4lpm or repeat ono 4lpm as rec   No obvious day to day or daytime variabilty or assoc chronic cough or cp or chest tightness, subjective wheeze overt sinus or hb symptoms. No unusual exp hx or h/o childhood pna/ asthma or knowledge of premature birth.  Sleeping ok  without nocturnal  or early am exacerbation  of respiratory  c/o's or need for noct saba. Also denies any obvious fluctuation of symptoms with weather or environmental changes or other aggravating or alleviating factors except as outlined above   Current Medications, Allergies, Complete Past Medical History, Past Surgical History, Family History, and Social History were reviewed in [employer] record.  ROS  The following are not active complaints unless bolded sore throat, dysphagia, dental problems, itching, sneezing,  nasal congestion or excess/ purulent secretions, ear ache,   fever, chills, sweats, unintended wt loss, pleuritic or exertional cp, hemoptysis,  orthopnea pnd or leg swelling, presyncope, palpitations, heartburn, abdominal pain, anorexia, nausea, vomiting, diarrhea  or change in bowel or urinary habits, change in stools or urine, dysuria,hematuria,  rash, arthralgias,  visual complaints, headache, numbness weakness or ataxia or problems with walking or coordination,  change in mood/affect or memory.                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 Nl external ear canals without cough reflex  NECK : without JVD/Nodes/TM/ nl carotid upstrokes bilaterally  LUNGS: no acc muscle use, clear to A and P bilaterally without cough on insp or exp maneuvers  CV: RRR no s3 or murmur , definite increase in P2, no edema  ABD: soft and nontender with nl excursion in the supine position. No bruits or organomegaly, bowel sounds nl  MS: warm without deformities, calf tenderness, cyanosis or clubbing  SKIN: warm and dry without lesions     pCXR 01/25/14 1. Mild left basilar atelectasis. Stable elevation left  hemidiaphragm.  2. Mild cardiomegaly with prominent central pulmonary arteries.  Pulmonary hypertension cannot be excluded. No evidence of congestive  heart  failure.   Assessment & Plan:

## 2014-03-23 NOTE — Patient Instructions (Addendum)
Please see patient coordinator before you leave today  to schedule 4lpm humidified at bedtime  and repeat ONO on 4lpm   Ok to use 2lpm at rest but should use 4lpm with all activities - this is for your heart  All followup except for 02 issues through [facility]

## 2014-03-23 NOTE — Assessment & Plan Note (Addendum)
-   09/24/2011   Walked 2lp x one lap @ 185 stopped due to desat to 88% and no change on 4lpm - RA sat 72% at rest, normalized on 2lpm at rest,  Required 4lpm to walk 185 ft - 10/02/2013   Walked 4lpm pulsed  x one lap @ 185 stopped due to  Sob and desat - ono 2lpm ordered 10/17/2013 > desat x 306 m > rec recheck on 4lpm ordered 03/23/2014  - 03/23/2014   Walked 2lpm x one lap @ 185 stopped due to  desat on 4lpm, ok at rest 2lpm   I had an extended discussion with the patient today lasting 15 to 20 minutes of a 25 minute visit on the following issues:  All we can offer her from this clinic is to make sure she wears the 02 24/7 at approp levels to maintain sats > 90% at all times>> all other rx per [facility]

## 2014-04-07 ENCOUNTER — Emergency Department (HOSPITAL_BASED_OUTPATIENT_CLINIC_OR_DEPARTMENT_OTHER)
Admission: EM | Admit: 2014-04-07 | Discharge: 2014-04-08 | Disposition: A | Discharge status: Home / Self Care | Payer: Medicare Other | Attending: Emergency Medicine | Admitting: Emergency Medicine

## 2014-04-07 ENCOUNTER — Encounter (HOSPITAL_BASED_OUTPATIENT_CLINIC_OR_DEPARTMENT_OTHER): Payer: Self-pay | Admitting: Emergency Medicine

## 2014-04-07 ENCOUNTER — Emergency Department (HOSPITAL_BASED_OUTPATIENT_CLINIC_OR_DEPARTMENT_OTHER): Payer: Medicare Other

## 2014-04-07 DIAGNOSIS — Z79899 Other long term (current) drug therapy: Secondary | ICD-10-CM | POA: Diagnosis not present

## 2014-04-07 DIAGNOSIS — Z8739 Personal history of other diseases of the musculoskeletal system and connective tissue: Secondary | ICD-10-CM | POA: Diagnosis not present

## 2014-04-07 DIAGNOSIS — Z86711 Personal history of pulmonary embolism: Secondary | ICD-10-CM | POA: Diagnosis not present

## 2014-04-07 DIAGNOSIS — Z87448 Personal history of other diseases of urinary system: Secondary | ICD-10-CM | POA: Diagnosis not present

## 2014-04-07 DIAGNOSIS — Z7901 Long term (current) use of anticoagulants: Secondary | ICD-10-CM | POA: Insufficient documentation

## 2014-04-07 DIAGNOSIS — Z8742 Personal history of other diseases of the female genital tract: Secondary | ICD-10-CM | POA: Diagnosis not present

## 2014-04-07 DIAGNOSIS — I2789 Other specified pulmonary heart diseases: Secondary | ICD-10-CM | POA: Diagnosis not present

## 2014-04-07 DIAGNOSIS — Z8669 Personal history of other diseases of the nervous system and sense organs: Secondary | ICD-10-CM | POA: Diagnosis not present

## 2014-04-07 DIAGNOSIS — E039 Hypothyroidism, unspecified: Secondary | ICD-10-CM | POA: Diagnosis not present

## 2014-04-07 DIAGNOSIS — Z8639 Personal history of other endocrine, nutritional and metabolic disease: Secondary | ICD-10-CM | POA: Insufficient documentation

## 2014-04-07 DIAGNOSIS — Z862 Personal history of diseases of the blood and blood-forming organs and certain disorders involving the immune mechanism: Secondary | ICD-10-CM | POA: Diagnosis not present

## 2014-04-07 DIAGNOSIS — R042 Hemoptysis: Secondary | ICD-10-CM

## 2014-04-07 LAB — CBC WITH DIFFERENTIAL/PLATELET
Basophils Absolute: 0 10*3/uL (ref 0.0–0.1)
Basophils Relative: 1 % (ref 0–1)
EOS ABS: 0.1 10*3/uL (ref 0.0–0.7)
Eosinophils Relative: 3 % (ref 0–5)
HCT: 39 % (ref 36.0–46.0)
HEMOGLOBIN: 12.9 g/dL (ref 12.0–15.0)
LYMPHS ABS: 2.1 10*3/uL (ref 0.7–4.0)
LYMPHS PCT: 49 % — AB (ref 12–46)
MCH: 31.5 pg (ref 26.0–34.0)
MCHC: 33.1 g/dL (ref 30.0–36.0)
MCV: 95.4 fL (ref 78.0–100.0)
Monocytes Absolute: 0.4 10*3/uL (ref 0.1–1.0)
Monocytes Relative: 10 % (ref 3–12)
NEUTROS ABS: 1.6 10*3/uL — AB (ref 1.7–7.7)
NEUTROS PCT: 38 % — AB (ref 43–77)
PLATELETS: 179 10*3/uL (ref 150–400)
RBC: 4.09 MIL/uL (ref 3.87–5.11)
RDW: 14.9 % (ref 11.5–15.5)
WBC: 4.3 10*3/uL (ref 4.0–10.5)

## 2014-04-07 LAB — BASIC METABOLIC PANEL
Anion gap: 13 (ref 5–15)
BUN: 23 mg/dL (ref 6–23)
CO2: 19 meq/L (ref 19–32)
Calcium: 9.1 mg/dL (ref 8.4–10.5)
Chloride: 103 mEq/L (ref 96–112)
Creatinine, Ser: 1.2 mg/dL — ABNORMAL HIGH (ref 0.50–1.10)
GFR calc Af Amer: 51 mL/min — ABNORMAL LOW (ref >90)
GFR, EST NON AFRICAN AMERICAN: 44 mL/min — AB (ref >90)
Glucose, Bld: 97 mg/dL (ref 70–99)
Potassium: 4.2 mEq/L (ref 3.7–5.3)
SODIUM: 135 meq/L — AB (ref 137–147)

## 2014-04-07 LAB — PROTIME-INR
INR: 1.97 — ABNORMAL HIGH (ref 0.00–1.49)
PROTHROMBIN TIME: 22.4 s — AB (ref 11.6–15.2)

## 2014-04-07 NOTE — ED Notes (Signed)
Pt reports that as she was singing she began to cough and there was some blood in it.

## 2014-04-07 NOTE — ED Provider Notes (Signed)
CSN: [id number]     Arrival date & time 04/07/14  2000 History  This chart was scribed for Michaela Fines, MD by Starleen Arms, ED Scribe. This patient was seen in room MH09/MH09 and the patient's care was started at 11:02 PM.    Chief Complaint  Patient presents with  . Hemoptysis    The history is provided by the patient. No language interpreter was used.    HPI Comments: Michaela Horton is a 73 y.o. female with a history of pulmonary HTN who presents to the Emergency Department complaining of 1 episode of hemoptysis that occurred at 6:30 this evening.  The patient states that she was shopping when she coughed up enough blood to "cover the tip of her shoe".  The patient states that the sputum consisted only of blood and no mucus.  The patient states that she several subsequent episodes of blood-streaked sputum that have resolved.. The patient reports use of at-home oxygen and an inhaler, but is unsure of the type.  Patient has associated SOB normal to baseline.     Pulmonologist: Dr. Melvyn Novas Velora Heckler)  Past Medical History  Diagnosis Date  . Dyspnea   . Pulmonary embolism   . Breast cyst     right  . Hypothyroidism   . Renal cyst   . Leukocytopenia   . Monoclonal gammopathy   . Osteoarthritis   . Schizoaffective disorder   . Wears dentures   . Pulmonary hypertension    Past Surgical History  Procedure Laterality Date  . Tubal ligation  1970  . Tubal ligation    . Breast surgery      cyst   Family History  Problem Relation Age of Onset  . Cancer Father     PROSTATE  . Atrial fibrillation Mother   . Alzheimer's disease Mother    History  Substance Use Topics  . Smoking status: Never Smoker   . Smokeless tobacco: Never Used  . Alcohol Use: No   OB History   Grav Para Term Preterm Abortions TAB SAB Ect Mult Living                 Review of Systems  A complete 10 system review of systems was obtained and all systems are negative except as noted in the HPI and PMH.     Allergies  Review of patient's allergies indicates no known allergies.  Home Medications   Prior to Admission medications   Medication Sig Start Date End Date Taking? Authorizing Provider  B Complex-C (B-COMPLEX WITH VITAMIN C) tablet Take 1 tablet by mouth daily.     Historical Provider, MD  bosentan (TRACLEER) 125 MG tablet Take 125 mg by mouth at bedtime.     Historical Provider, MD  calcium carbonate (OS-CAL) 600 MG TABS tablet Take 600 mg by mouth 2 (two) times daily with a meal.    Historical Provider, MD  furosemide (LASIX) 20 MG tablet Take 20 mg by mouth daily.     Historical Provider, MD  levothyroxine (SYNTHROID, LEVOTHROID) 112 MCG tablet Take 112 mcg by mouth daily before breakfast.  10/09/10   Historical Provider, MD  LORazepam (ATIVAN) 1 MG tablet Take 1 tablet by mouth 2 (two) times daily as needed for anxiety or sleep.  10/13/13   Historical Provider, MD  Multiple Vitamin (MULTIVITAMIN WITH MINERALS) TABS tablet Take 1 tablet by mouth every morning.    Historical Provider, MD  spironolactone (ALDACTONE) 50 MG tablet Take 50 mg by mouth daily.  04/17/13 04/17/14  Historical Provider, MD  temazepam (RESTORIL) 15 MG capsule Take 30-45 mg by mouth at bedtime.     Historical Provider, MD  vitamin C (ASCORBIC ACID) 500 MG tablet Take 500 mg by mouth daily.     Historical Provider, MD  warfarin (COUMADIN) 7.5 MG tablet Take 7.5 mg by mouth daily.    Historical Provider, MD   BP 115/77  Pulse 82  Temp(Src) 97.6 F (36.4 C) (Oral)  Resp 18  Ht 5' 1.5" (1.562 m)  Wt 136 lb (61.689 kg)  BMI 25.28 kg/m2  SpO2 91% Physical Exam  Nursing note and vitals reviewed.   General: Well-developed, well-nourished female in no acute distress; appearance consistent with age of record HENT: normocephalic; atraumatic;  Eyes: pupils equal, round and reactive to light; extraocular muscles intact; arcus senilus bilaterally Neck: supple Heart: regular rate and rhythm; no murmurs Lungs: clear  to auscultation bilaterally Abdomen: soft; nondistended; nontender; no masses or hepatosplenomegaly; bowel sounds present Extremities: No deformity; full range of motion; pulses normal; trace edema of lower legs - right greater than left Neurologic: Awake, alert and oriented; motor function intact in all extremities and symmetric; no facial droop Skin: Warm and dry Psychiatric: Normal mood and affect   ED Course  Procedures (including critical care time)  DIAGNOSTIC STUDIES: Oxygen Saturation is 93% on 2 L/min, adequate by my interpretation.    COORDINATION OF CARE:  11:13 PM Discussed plan to order labs and imaging.  Patient acknowledges and agrees with plan.    MDM   Nursing notes and vitals signs, including pulse oximetry, reviewed.  Summary of this visit's results, reviewed by myself:  Labs:  Results for orders placed during the hospital encounter of 04/07/14 (from the past 24 hour(s))  CBC WITH DIFFERENTIAL     Status: Abnormal   Collection Time    04/07/14 11:25 PM      Result Value Ref Range   WBC 4.3  4.0 - 10.5 K/uL   RBC 4.09  3.87 - 5.11 MIL/uL   Hemoglobin 12.9  12.0 - 15.0 g/dL   HCT 39.0  36.0 - 46.0 %   MCV 95.4  78.0 - 100.0 fL   MCH 31.5  26.0 - 34.0 pg   MCHC 33.1  30.0 - 36.0 g/dL   RDW 14.9  11.5 - 15.5 %   Platelets 179  150 - 400 K/uL   Neutrophils Relative % 38 (*) 43 - 77 %   Neutro Abs 1.6 (*) 1.7 - 7.7 K/uL   Lymphocytes Relative 49 (*) 12 - 46 %   Lymphs Abs 2.1  0.7 - 4.0 K/uL   Monocytes Relative 10  3 - 12 %   Monocytes Absolute 0.4  0.1 - 1.0 K/uL   Eosinophils Relative 3  0 - 5 %   Eosinophils Absolute 0.1  0.0 - 0.7 K/uL   Basophils Relative 1  0 - 1 %   Basophils Absolute 0.0  0.0 - 0.1 K/uL  BASIC METABOLIC PANEL     Status: Abnormal   Collection Time    04/07/14 11:25 PM      Result Value Ref Range   Sodium 135 (*) 137 - 147 mEq/L   Potassium 4.2  3.7 - 5.3 mEq/L   Chloride 103  96 - 112 mEq/L   CO2 19  19 - 32 mEq/L    Glucose, Bld 97  70 - 99 mg/dL   BUN 23  6 - 23 mg/dL  Creatinine, Ser 1.20 (*) 0.50 - 1.10 mg/dL   Calcium 9.1  8.4 - 10.5 mg/dL   GFR calc non Af Amer 44 (*) >90 mL/min   GFR calc Af Amer 51 (*) >90 mL/min   Anion gap 13  5 - 15  PROTIME-INR     Status: Abnormal   Collection Time    04/07/14 11:25 PM      Result Value Ref Range   Prothrombin Time 22.4 (*) 11.6 - 15.2 seconds   INR 1.97 (*) 0.00 - 1.49    Imaging Studies: Dg Chest 2 View  04/07/2014   CLINICAL DATA:  hemoptysis  EXAM: CHEST  2 VIEW  COMPARISON:  Prior radiograph from 01/25/2014  FINDINGS: Mild cardiomegaly is stable. Mediastinal silhouette within normal limits.  Elevation of the left hemidiaphragm with left basilar atelectasis is similar from prior. No airspace consolidation, pleural effusion, or pulmonary edema is identified. There is no pneumothorax.  No acute osseous abnormality identified.  IMPRESSION: 1. Stable appearance of the chest with elevation left hemidiaphragm way in mild left basilar atelectasis. 2. No acute cardiopulmonary abnormality identified.   Electronically Signed   By: Jeannine Boga M.D.   On: 04/07/2014 23:48   12:51 AM No further hemoptysis while in the ED. Her Coumadin is just slightly subtherapeutic and she and her son, who is a physician, were advised of this. She has an appointment this week with her pulmonologist Dr. Melvyn Novas.  I personally performed the services described in this documentation, which was scribed in my presence. The recorded information has been reviewed and is accurate.   Michaela Fines, MD 04/08/14 [phone]

## 2014-04-08 NOTE — Discharge Instructions (Signed)
Hemoptysis  Hemoptysis, which means coughing up blood, can be a sign of a minor problem or a serious medical condition. The blood that is coughed up may come from the lungs and airways. Coughed-up blood can also come from bleeding that occurs outside the lungs and airways. Blood can drain into the windpipe during a severe nosebleed or when blood is vomited from the stomach. Because hemoptysis can be a sign of something serious, a medical evaluation is required. For some people with hemoptysis, no definite cause is ever identified.  CAUSES   The most common cause of hemoptysis is bronchitis. Some other common causes include:    A ruptured blood vessel caused by coughing or an infection.    A medical condition that causes damage to the large air passageways (bronchiectasis).    A blood clot in the lungs (pulmonary embolism).    Pneumonia.    Tuberculosis.    Breathing in a small foreign object.    Cancer.  For some people with hemoptysis, no definite cause is ever identified.   HOME CARE INSTRUCTIONS   Only take over-the-counter or prescription medicines as directed by your caregiver. Do not use cough suppressants unless your caregiver approves.   If your caregiver prescribes antibiotic medicines, take them as directed. Finish them even if you start to feel better.   Do not smoke. Also avoid secondhand smoke.   Follow up with your caregiver as directed.  SEEK IMMEDIATE MEDICAL CARE IF:    You cough up bloody mucus for longer than a week.   You have a blood-producing cough that is severe or getting worse.   You have a blood-producing cough thatcomes and goes over time.   You develop problems with your breathing.    You vomit blood.   You develop bloody or black-colored stools.   You have chest pain.    You develop night sweats.   You feel faint or pass out.    You have a fever or persistent symptoms for more than 2-3 days.   You have a fever and your symptoms suddenly get worse.  MAKE  SURE YOU:   Understand these instructions.   Will watch your condition.   Will get help right away if you are not doing well or get worse.  Document Released: 10/26/2001 Document Revised: 08/03/2012 Document Reviewed: 06/03/2012  [employer] Patient Information 2015 [employer]. This information is not intended to replace advice given to you by your health care provider. Make sure you discuss any questions you have with your health care provider.

## 2014-04-17 ENCOUNTER — Other Ambulatory Visit (INDEPENDENT_AMBULATORY_CARE_PROVIDER_SITE_OTHER): Payer: Medicare Other

## 2014-04-17 ENCOUNTER — Encounter: Payer: Self-pay | Admitting: Internal Medicine

## 2014-04-17 ENCOUNTER — Telehealth: Payer: Self-pay | Admitting: Internal Medicine

## 2014-04-17 ENCOUNTER — Ambulatory Visit (INDEPENDENT_AMBULATORY_CARE_PROVIDER_SITE_OTHER): Payer: Medicare Other | Admitting: Internal Medicine

## 2014-04-17 VITALS — BP 122/80 | HR 77 | Ht 61.5 in | Wt 136.0 lb

## 2014-04-17 DIAGNOSIS — I2724 Chronic thromboembolic pulmonary hypertension: Secondary | ICD-10-CM

## 2014-04-17 DIAGNOSIS — J9611 Chronic respiratory failure with hypoxia: Secondary | ICD-10-CM

## 2014-04-17 DIAGNOSIS — R0902 Hypoxemia: Secondary | ICD-10-CM

## 2014-04-17 DIAGNOSIS — I2699 Other pulmonary embolism without acute cor pulmonale: Secondary | ICD-10-CM

## 2014-04-17 DIAGNOSIS — J9610 Chronic respiratory failure, unspecified whether with hypoxia or hypercapnia: Secondary | ICD-10-CM

## 2014-04-17 DIAGNOSIS — I2789 Other specified pulmonary heart diseases: Secondary | ICD-10-CM

## 2014-04-17 LAB — PROTIME-INR
INR: 2.7 ratio — AB (ref 0.8–1.0)
Prothrombin Time: 29.2 s — ABNORMAL HIGH (ref 9.6–13.1)

## 2014-04-17 NOTE — Progress Notes (Signed)
Subjective:    Patient ID: Michaela Horton, female    DOB: 27-Jul-1941    MRN: [id number]     Brief patient profile:  22 yobf never smoker dx'd with PE by CT angiogram 06/24/2010 and referred by Dr Drema Dallas to pulmonary clinic for eval of persistent doe since then with Dx of [place].   History of Present Illness  November 04, 2010 1st pulmonary office eval cc doe x 8 month no better since dx and rx of PE. doe x 50 ft  no assoc cough, mild leg swelling.  desat walking, rec v/q and repeat echo >  Mod to high prob/ severe PAH  12/19/10 ov/Michaela Horton:  Cc no change doe, returns for PFT's (minimal airflow obst with   dlco =51%).   rec You still appear to have blood blots in your lungs 6 months after you started coumadin so you need to stay on this Referred to [facility]/ [facility] clinic > trachleer started       09/24/2011 f/u ov/Michaela Horton cc Unchanged doe- here to recertify for o2 prn with exertion. Sats when arrived 87%ra but then improved to 93 at rest At rest not typically using any 02 but  automatically at bedtime and   On 02 2 lpm can shop at [place] s decline in ability and thinking about exercise program. No cough or leg swelling rec 02 should be worn automatically at bedtime and for any more activity than walking across a room at home  At 2lpm When really exerting yourself use 4lpm   05/18/2013 f/u ov/Michaela Horton re: 02 recert Chief Complaint  Patient presents with  . Follow-up    Pt states here to see about getting qualified for POC. Pt states that her breathing is doing well. She c/o wheezing and cough for the past 2 months. Cough is non prod.   2 lpm at rest 5 lpm with activity and can still do aisles at HT x 2 then has to rest due to sob/ fatigue No leg swelling or am ha rec 2lpm  24/7 but increase to 4lpm with any activity   10/02/2013 f/u ov/Michaela Horton re: chronic resp failure/ 02 dep secondary to [place]  Patient presents with  . Follow-up    Pt c/o increased SOB for the past 6 wks. She also  c/o fatigue. Sometimes forgets to use o2, but seems okay when she does.    Not using 02 as rec ? 4lpm when she thinks to put  it on rather than 2/4 as rec previously, denies getting any written instructions from [facility] clinic  rec Please see patient coordinator before you leave today  to schedule ono 2lpm - otherwise keep you 02 on 4lpm 24/7 All pulmonary follow up at [facility] - return here as needed    03/23/2014 f/u ov/Michaela Horton re: on 2lpm "24/7" Chief Complaint  Patient presents with  . Follow-up    Pt states that her breathing is doing well. She denies any new co's today.   when walks outside doesn't always use 02  About half the time at rest doesn't [name] it  it but when she does it's  2lpm  Every night at hs 2lpm and never increased to 4lpm or repeat ono 4lpm as rec  rec Please see patient coordinator before you leave today  to schedule 4lpm humidified at bedtime  and repeat ONO on 4lpm  Ok to use 2lpm at rest but should use 4lpm with all activities - this is for your heart  04/17/2014 f/u ov/Michaela Horton re: TEPAH./ chronic resp failure  Chief Complaint  Patient presents with  . Follow-up    Pt wanting to leave [facility] and switch to [place] wanting to know if Dr Melvyn Novas can continue to follow her if she cannot be referred back out.   did not wear 02 night prior to OV, sometimes walks s 02      No obvious day to day or daytime variabilty or assoc chronic cough or cp or chest tightness, subjective wheeze overt sinus or hb symptoms. No unusual exp hx or h/o childhood pna/ asthma or knowledge of premature birth.  Sleeping ok without nocturnal  or early am exacerbation  of respiratory  c/o's or need for noct saba. Also denies any obvious fluctuation of symptoms with weather or environmental changes or other aggravating or alleviating factors except as outlined above   Current Medications, Allergies, Complete Past Medical History, Past Surgical History, Family History, and Social History were  reviewed in [employer] record.  ROS  The following are not active complaints unless bolded sore throat, dysphagia, dental problems, itching, sneezing,  nasal congestion or excess/ purulent secretions, ear ache,   fever, chills, sweats, unintended wt loss, pleuritic or exertional cp, hemoptysis,  orthopnea pnd or leg swelling, presyncope, palpitations, heartburn, abdominal pain, anorexia, nausea, vomiting, diarrhea  or change in bowel or urinary habits, change in stools or urine, dysuria,hematuria,  rash, arthralgias, visual complaints, headache, numbness weakness or ataxia or problems with walking or coordination,  change in mood/affect or memory.                Past Medical History:  DYSPNEA (ICD-786.05)      - PFT's  FEV1  1.42 (69%) ratio 65 with DLCO 51%  PULMONARY EMBOLISM, HX OF (ICD-V12.51)  - Dx 05/2010  - Venous dopplers neg 08/19/2010  - Echocardiogram 11/28/10:  Severely elevated RV sys pressure with dilated R Ht est at 99 with nl LV -VQ  11/05/10  Mod to high prob PE BREAST CYST, RIGHT (ICD-610.0)  EFFUSION, PLEURAL (ICD-511.9)  RENAL CYST, RIGHT (ICD-593.2)  HYPOTHYROIDISM (ICD-244.9)  LEUKOCYTOPENIA UNSPECIFIED (ICD-288.50)  MONOCLONAL GAMMOPATHY (ICD-273.1)  OSTEOARTHRITIS (ICD-715.90)  SCHIZOAFFECTIVE DISORDER (ICD-295.70)    Past Surgical History:  Tubal ligation 1970   Social History:  Divorced  Children  Never smoker  No ETOH  Retired from working at a nursery                  Objective:   Physical Exam  amb bf nad unusual affect,  Very evasive with responses, confused with details of care     wt 177 November 05, 2010  >  173 12/19/2010 > 09/24/2011  179 > 161 05/18/2013 > 10/02/2013  156 > 03/23/2014 140 > 04/17/2014  136   HEENT: nl dentition, turbinates, and orophanx. Nl external ear canals without cough reflex  NECK : without JVD/Nodes/TM/ nl carotid upstrokes bilaterally  LUNGS: no acc muscle use, clear to A and P bilaterally  without cough on insp or exp maneuvers  CV: RRR no s3 or murmur , definite increase in P2, no edema  ABD: soft and nontender with nl excursion in the supine position. No bruits or organomegaly, bowel sounds nl  MS: warm without deformities, calf tenderness, cyanosis or clubbing  SKIN: warm and dry without lesions     cxr 04/07/14 1. Stable appearance of the chest with elevation left hemidiaphragm  way in mild left basilar atelectasis.  2. No  acute cardiopulmonary abnormality identified.    Assessment & Plan:

## 2014-04-17 NOTE — Telephone Encounter (Signed)
[facility], office is closed. Lab faxed to Dr. Drema Dallas.  WCB in morning.

## 2014-04-17 NOTE — Telephone Encounter (Signed)
Phone number for [facility]: [phone].

## 2014-04-17 NOTE — Telephone Encounter (Signed)
Called [employer], spoke with [place].  Pt had INR checked with their office yesterday - results 3.9.  Per [place], Dr. Earle Gell would like to know if MW would order an INR today on this pt while she is here for appt with him to save pt a trip back to their office.  [place] states they will handle results if we could just order lab and call and fax results to them once available.  Fax # [phone].  Dr. Melvyn Novas, pls advise if you are ok with doing this.  Pt's appt with you is today at 10:45 am.  Thank you.

## 2014-04-17 NOTE — Patient Instructions (Addendum)
Please see patient coordinator before you leave today  to schedule referral to [facility] re Pulmonary hypertension and results of your overnight 02 and make sure your oxygen is humidified.  For now leave 02 on 4lpm at bedtime. 2lpm at rest, 4lpm when walking   Please remember to go to the lab   department downstairs for your tests - we will call you with the results when they are available.  Pulmonary follow up is just for your oxygen issues as needed

## 2014-04-17 NOTE — Telephone Encounter (Signed)
Done and sent results to triage

## 2014-04-18 NOTE — Assessment & Plan Note (Addendum)
-   PE Dx 05/2010 VQ  11/05/10  Mod to high prob PE > referred to [facility] 12/19/2010  And started on 02 24 h/day  - Venous dopplers neg 08/19/2010  - Echocardiogram 11/28/10:  Severely elevated RV sys pressure with dilated R Ht est at 99 with nl LV - requested transfer to [facility] 04/17/14   Turned down for thrombectomy at [place] already and very intolerant to meds attempted to date per pt > main rx from my perspective is adequate 02   Anticoagulation per Dr Drema Dallas

## 2014-04-18 NOTE — Assessment & Plan Note (Signed)
-   09/24/2011   Walked 2lp x one lap @ 185 stopped due to desat to 88% and no change on 4lpm - RA sat 72% at rest, normalized on 2lpm at rest,  Required 4lpm to walk 185 ft - 10/02/2013   Walked 4lpm pulsed  x one lap @ 185 stopped due to  Sob and desat - ono 2lpm ordered 10/17/2013 > desat x 306 m > rec recheck on 4lpm ordered 03/23/2014  - 03/23/2014   Walked 2lpm x one lap @ 185 stopped due to  desat on 4lpm, ok at rest 2lpm  - 04/17/2014  Walked 4lpm poc x 1 lap @ 185 ft each stopped due to desat to 86%, sob vs 93% pulsed at rest  I had an extended discussion with the patient today lasting 15 to 20 minutes of a 25 minute visit on the following issues: 1) 02 is the main treatment at this point she can tolerate and is not ideally adjusted with exercise but she is not willing to even use the POC as instructed on a consistent basis and she likes this better than any other portable system so I'd rather her use this consistently than another system less often. 2) all f/u per [facility] and here prn to review 02 needs

## 2014-04-20 NOTE — Telephone Encounter (Signed)
ATC yesterday morning and was on hold x 15 minutes. Results were faxed. WCB.[place] Bing, CMA

## 2014-04-23 NOTE — Telephone Encounter (Signed)
Called [facility].  Spoke with Levada Dy. [place] is not available at this moment.  She will ask [place] to return call.  Will await call to ensure Dr. Drema Dallas has received the results.

## 2014-04-23 NOTE — Telephone Encounter (Signed)
Spoke with the [place]  Results have already been received  Nothing needed

## 2014-05-23 ENCOUNTER — Other Ambulatory Visit: Payer: Self-pay

## 2014-05-23 DIAGNOSIS — Z1231 Encounter for screening mammogram for malignant neoplasm of breast: Secondary | ICD-10-CM

## 2014-06-03 ENCOUNTER — Emergency Department (HOSPITAL_COMMUNITY): Payer: Medicare Other

## 2014-06-03 ENCOUNTER — Emergency Department (HOSPITAL_COMMUNITY)
Admission: EM | Admit: 2014-06-03 | Discharge: 2014-06-03 | Disposition: A | Discharge status: Home / Self Care | Payer: Medicare Other | Attending: Emergency Medicine | Admitting: Emergency Medicine

## 2014-06-03 ENCOUNTER — Encounter (HOSPITAL_COMMUNITY): Payer: Self-pay | Admitting: Emergency Medicine

## 2014-06-03 DIAGNOSIS — E039 Hypothyroidism, unspecified: Secondary | ICD-10-CM | POA: Diagnosis not present

## 2014-06-03 DIAGNOSIS — M25561 Pain in right knee: Secondary | ICD-10-CM | POA: Diagnosis not present

## 2014-06-03 DIAGNOSIS — Z7901 Long term (current) use of anticoagulants: Secondary | ICD-10-CM | POA: Insufficient documentation

## 2014-06-03 DIAGNOSIS — Q6100 Congenital renal cyst, unspecified: Secondary | ICD-10-CM | POA: Diagnosis not present

## 2014-06-03 DIAGNOSIS — Z98811 Dental restoration status: Secondary | ICD-10-CM | POA: Insufficient documentation

## 2014-06-03 DIAGNOSIS — Z8739 Personal history of other diseases of the musculoskeletal system and connective tissue: Secondary | ICD-10-CM | POA: Diagnosis not present

## 2014-06-03 DIAGNOSIS — Z79899 Other long term (current) drug therapy: Secondary | ICD-10-CM | POA: Insufficient documentation

## 2014-06-03 DIAGNOSIS — I270 Primary pulmonary hypertension: Secondary | ICD-10-CM | POA: Insufficient documentation

## 2014-06-03 DIAGNOSIS — Z862 Personal history of diseases of the blood and blood-forming organs and certain disorders involving the immune mechanism: Secondary | ICD-10-CM | POA: Diagnosis not present

## 2014-06-03 DIAGNOSIS — Z86711 Personal history of pulmonary embolism: Secondary | ICD-10-CM | POA: Diagnosis not present

## 2014-06-03 LAB — PROTIME-INR
INR: 2.14 — ABNORMAL HIGH (ref 0.00–1.49)
PROTHROMBIN TIME: 23.9 s — AB (ref 11.6–15.2)

## 2014-06-03 MED ORDER — TRAMADOL HCL 50 MG PO TABS
50.0000 mg | ORAL_TABLET | Freq: Once | ORAL | Status: AC
  Administered 2014-06-03: 50 mg via ORAL
  Filled 2014-06-03: qty 1

## 2014-06-03 MED ORDER — TRAMADOL HCL 50 MG PO TABS: 50.0000 mg | ORAL_TABLET | Freq: Four times a day (QID) | ORAL | Status: DC | PRN

## 2014-06-03 NOTE — ED Notes (Signed)
Per EMS: right knee pain, had shot last week, worked for a day but does no longer works.  Hx of dementia.    From home, lives alone.

## 2014-06-03 NOTE — Discharge Instructions (Signed)
Please follow the directions provided.  Be sure to follow-up with your primary care provider to ensure you are getting better.  You may take the Ultram as directed for pain.  Don't hesitate to return for any new , worsening or concerning symptoms.    SEEK IMMEDIATE MEDICAL CARE IF:  Your knee joint feels hot to the touch and you have a high fever.

## 2014-06-03 NOTE — ED Provider Notes (Signed)
CSN: [id number]     Arrival date & time 06/03/14  1645 History   First MD Initiated Contact with Patient 06/03/14 1646     Chief Complaint  Patient presents with  . Knee Pain   (Consider location/radiation/quality/duration/timing/severity/associated sxs/prior Treatment) HPI Michaela Horton is a 73 year old female presenting from EMS with reports of knee pain times several months worse today. She denies any new injury but states that her knee is still bothering her. EMS reports that the patient's affect and behavior is bizarre.  [name] reports pt has his of pulmonary hypertension and would like her lungs evaluated also.  The daughter reports this behavior(singing and occasionally yelling out) is the pt's baseline. The pt denies shortness of breath, chest pain, abd pain, back pain or nausea and vomiting. Her only complaint is her knee pain.  Past Medical History  Diagnosis Date  . Dyspnea   . Pulmonary embolism   . Breast cyst     right  . Hypothyroidism   . Renal cyst   . Leukocytopenia   . Monoclonal gammopathy   . Osteoarthritis   . Schizoaffective disorder   . Wears dentures   . Pulmonary hypertension    Past Surgical History  Procedure Laterality Date  . Tubal ligation  1970  . Tubal ligation    . Breast surgery      cyst   Family History  Problem Relation Age of Onset  . Cancer Father     PROSTATE  . Atrial fibrillation Mother   . Alzheimer's disease Mother    History  Substance Use Topics  . Smoking status: Never Smoker   . Smokeless tobacco: Never Used  . Alcohol Use: No   OB History   Grav Para Term Preterm Abortions TAB SAB Ect Mult Living                 Review of Systems  Unable to perform ROS: Psychiatric disorder    Allergies  Review of patient's allergies indicates no known allergies.  Home Medications   Prior to Admission medications   Medication Sig Start Date End Date Taking? Authorizing Provider  B Complex-C (B-COMPLEX WITH VITAMIN  C) tablet Take 1 tablet by mouth daily.     Historical Provider, MD  bosentan (TRACLEER) 125 MG tablet Take 125 mg by mouth at bedtime.     Historical Provider, MD  calcium carbonate (OS-CAL) 600 MG TABS tablet Take 600 mg by mouth 2 (two) times daily with a meal.    Historical Provider, MD  Dextromethorphan HBr (ROBITUSSIN MAXIMUM STRENGTH PO) Take by mouth as needed.    Historical Provider, MD  furosemide (LASIX) 20 MG tablet Take 20 mg by mouth daily.     Historical Provider, MD  Ginkgo Biloba 120 MG CAPS Take 1 capsule by mouth daily.    Historical Provider, MD  Korean Panax Ginseng 100 MG CAPS Take 1 tablet by mouth daily.    Historical Provider, MD  levothyroxine (SYNTHROID, LEVOTHROID) 112 MCG tablet Take 112 mcg by mouth daily before breakfast.  10/09/10   Historical Provider, MD  LORazepam (ATIVAN) 1 MG tablet Take 1 tablet by mouth 2 (two) times daily as needed for anxiety or sleep.  10/13/13   Historical Provider, MD  Multiple Vitamin (MULTIVITAMIN WITH MINERALS) TABS tablet Take 1 tablet by mouth every morning.    Historical Provider, MD  Multiple Vitamins-Minerals ([employer] DAILY ENERGY) TABS Take by mouth daily.    Historical Provider, MD  temazepam (RESTORIL) 15  MG capsule Take 30-45 mg by mouth at bedtime.     Historical Provider, MD  Turmeric 500 MG CAPS Take 1 tablet by mouth daily.    Historical Provider, MD  vitamin C (ASCORBIC ACID) 500 MG tablet Take 500 mg by mouth daily.     Historical Provider, MD  warfarin (COUMADIN) 7.5 MG tablet Take 7.5 mg by mouth daily.    Historical Provider, MD   BP 110/64  Pulse 82  Temp(Src) 98.4 F (36.9 C) (Oral)  Ht 5\' 1"  (1.549 m)  Wt 135 lb (61.236 kg)  BMI 25.52 kg/m2  SpO2 91% Physical Exam  Nursing note and vitals reviewed. Constitutional: She appears well-developed and well-nourished. No distress.  HENT:  Head: Normocephalic and atraumatic.  Mouth/Throat: Oropharynx is clear and moist. No oropharyngeal exudate.  Eyes: Conjunctivae are  normal.  Neck: Neck supple. No thyromegaly present.  Cardiovascular: Normal rate, regular rhythm and intact distal pulses.   Pulmonary/Chest: Effort normal. No respiratory distress. She has decreased breath sounds in the right lower field and the left lower field. She has no wheezes. She has no rhonchi. She has no rales. She exhibits no tenderness.  Abdominal: Soft. There is no tenderness.  Musculoskeletal: She exhibits tenderness.       Right knee: She exhibits swelling. She exhibits normal range of motion, no deformity, no LCL laxity, normal patellar mobility and no MCL laxity. Tenderness found.  Lymphadenopathy:    She has no cervical adenopathy.  Neurological: She is alert. She has normal strength. No cranial nerve deficit or sensory deficit. Coordination normal. GCS eye subscore is 4. GCS verbal subscore is 5. GCS motor subscore is 6.  Skin: Skin is warm and dry. No rash noted. She is not diaphoretic.  Psychiatric:  Pt's mood and affect varies from pleasant and making religious references and answering questions by singing to angry and frustrated and cursing about her daughter and people at her church.  She denies thoughts of hurting herself or others.    ED Course  Procedures (including critical care time) Labs Review Labs Reviewed  PROTIME-INR - Abnormal; Notable for the following:    Prothrombin Time 23.9 (*)    INR 2.14 (*)    All other components within normal limits   Imaging Review DG Knee Complete 4 Views Right (Final result)  Result time: 06/03/14 19:23:54    Final result by Rad Results In Interface (06/03/14 19:23:54)    Narrative:   CLINICAL DATA: Right knee pain, had injection of knee last week  EXAM: RIGHT KNEE - COMPLETE 4+ VIEW  COMPARISON: 03/30/2014  FINDINGS: There is no fracture or dislocation. There is mild osteophyte formation in the lateral compartment. The medial compartment shows moderate narrowing and osteophyte formation. There is a  moderate joint effusion, similar to prior study with moderate narrowing and mild osteophyte formation of the patellofemoral compartment.  IMPRESSION: Arthritic change with joint effusion.   Electronically Signed By: Skipper Cliche M.D. On: 06/03/2014 19:23             DG Chest 2 View (Final result)  Result time: 06/03/14 19:27:26    Final result by Rad Results In Interface (06/03/14 19:27:26)    Narrative:   CLINICAL DATA: Chest pain.  EXAM: CHEST 2 VIEW  COMPARISON: 04/07/2014  FINDINGS: Lungs are somewhat hypoinflated with stable elevation of the left hemidiaphragm. There is no focal consolidation or effusion. There is mild stable cardiomegaly. Remainder the exam is unchanged.  IMPRESSION: No active cardiopulmonary disease.  Mild stable cardiomegaly.     EKG Interpretation None      MDM   Final diagnoses:  Right knee pain   73 yo female presents with report of knee pain, also odd behavior with psych history.  When pt's daughter arrived, reports pt is at her baseline mental and behavioral status, but pt has history of pulmonary hypertension and pulmonary embolism and she would like lungs evaluated.  Knee xray, chest xray, PT-INR ordered. Ultram for pain.  INR therapeutic, CXR negative for acute abnormality. Patient knee x-ray negative for obvious fracture or dislocation or effusion. Pain managed in ED. Pt in no acute distress and vital signs stable. Discharge instructions include Ultram prescription for pain and referral to orthopedics if symptoms persist and instructions to follow-up with PCP. Pt and daughter agreeable with above plan. Return precautions provided.   Filed Vitals:   06/03/14 1843 06/03/14 1845 06/03/14 2030 06/03/14 2048  BP: 116/91 115/73 102/76 144/83  Pulse:  80 79 86  Temp:    98.1 F (36.7 C)  TempSrc:    Oral  Resp:  16  24  Height:      Weight:      SpO2:  100% 92% 95%   Meds given in ED:  Medications  traMADol (ULTRAM)  tablet 50 mg (50 mg Oral Given 06/03/14 1842)    Discharge Medication List as of 06/03/2014  8:44 PM    START taking these medications   Details  traMADol (ULTRAM) 50 MG tablet Take 1 tablet (50 mg total) by mouth every 6 (six) hours as needed., Starting 06/03/2014, Until Discontinued, Print         Britt Bottom, NP 06/07/14 2234

## 2014-06-11 ENCOUNTER — Ambulatory Visit
Admission: RE | Admit: 2014-06-11 | Discharge: 2014-06-11 | Disposition: A | Discharge status: Home / Self Care | Payer: Medicare Other | Source: Ambulatory Visit

## 2014-06-11 DIAGNOSIS — Z1231 Encounter for screening mammogram for malignant neoplasm of breast: Secondary | ICD-10-CM

## 2014-06-11 NOTE — ED Provider Notes (Signed)
Medical screening examination/treatment/procedure(s) were conducted as a shared visit with non-physician practitioner(s) and myself.  I personally evaluated the patient during the encounter.   EKG Interpretation None      Pt presents with knee pain, similar to prior episodes.  Does not look concerning for septic joint.  Pt also has hx of schizoaffective d/o and has bizarre behavior in ED, but no overtly psychotic, is alert and oriented.  Family who is with pt says that this is her baseline behavior.  Malvin Johns, MD 06/11/14 1014

## 2014-07-02 ENCOUNTER — Telehealth (HOSPITAL_COMMUNITY): Payer: Self-pay

## 2014-07-02 NOTE — Telephone Encounter (Signed)
I have called and left a message with [place]icia to inquire about participation in Pulmonary Rehab.

## 2014-07-07 ENCOUNTER — Encounter (HOSPITAL_COMMUNITY): Payer: Self-pay | Admitting: Emergency Medicine

## 2014-07-07 ENCOUNTER — Emergency Department (HOSPITAL_COMMUNITY): Payer: Medicare Other

## 2014-07-07 ENCOUNTER — Emergency Department (HOSPITAL_COMMUNITY)
Admission: EM | Admit: 2014-07-07 | Discharge: 2014-07-08 | Disposition: A | Discharge status: Home / Self Care | Payer: Medicare Other | Attending: Emergency Medicine | Admitting: Emergency Medicine

## 2014-07-07 DIAGNOSIS — Z79899 Other long term (current) drug therapy: Secondary | ICD-10-CM | POA: Diagnosis not present

## 2014-07-07 DIAGNOSIS — Q6100 Congenital renal cyst, unspecified: Secondary | ICD-10-CM | POA: Diagnosis not present

## 2014-07-07 DIAGNOSIS — R4781 Slurred speech: Secondary | ICD-10-CM | POA: Insufficient documentation

## 2014-07-07 DIAGNOSIS — Z7901 Long term (current) use of anticoagulants: Secondary | ICD-10-CM | POA: Diagnosis not present

## 2014-07-07 DIAGNOSIS — I270 Primary pulmonary hypertension: Secondary | ICD-10-CM | POA: Insufficient documentation

## 2014-07-07 DIAGNOSIS — R042 Hemoptysis: Secondary | ICD-10-CM | POA: Diagnosis present

## 2014-07-07 DIAGNOSIS — R05 Cough: Secondary | ICD-10-CM | POA: Diagnosis not present

## 2014-07-07 DIAGNOSIS — E039 Hypothyroidism, unspecified: Secondary | ICD-10-CM | POA: Insufficient documentation

## 2014-07-07 DIAGNOSIS — R0989 Other specified symptoms and signs involving the circulatory and respiratory systems: Secondary | ICD-10-CM | POA: Diagnosis not present

## 2014-07-07 DIAGNOSIS — Z8742 Personal history of other diseases of the female genital tract: Secondary | ICD-10-CM | POA: Diagnosis not present

## 2014-07-07 DIAGNOSIS — Z8739 Personal history of other diseases of the musculoskeletal system and connective tissue: Secondary | ICD-10-CM | POA: Diagnosis not present

## 2014-07-07 DIAGNOSIS — Z98811 Dental restoration status: Secondary | ICD-10-CM | POA: Insufficient documentation

## 2014-07-07 DIAGNOSIS — R0602 Shortness of breath: Secondary | ICD-10-CM | POA: Diagnosis not present

## 2014-07-07 DIAGNOSIS — D72819 Decreased white blood cell count, unspecified: Secondary | ICD-10-CM | POA: Insufficient documentation

## 2014-07-07 DIAGNOSIS — Z8659 Personal history of other mental and behavioral disorders: Secondary | ICD-10-CM | POA: Diagnosis not present

## 2014-07-07 DIAGNOSIS — Z86711 Personal history of pulmonary embolism: Secondary | ICD-10-CM | POA: Diagnosis not present

## 2014-07-07 DIAGNOSIS — R059 Cough, unspecified: Secondary | ICD-10-CM

## 2014-07-07 LAB — APTT: APTT: 32 s (ref 24–37)

## 2014-07-07 LAB — CBC
HCT: 39.8 % (ref 36.0–46.0)
HEMOGLOBIN: 13.2 g/dL (ref 12.0–15.0)
MCH: 31.7 pg (ref 26.0–34.0)
MCHC: 33.2 g/dL (ref 30.0–36.0)
MCV: 95.4 fL (ref 78.0–100.0)
Platelets: 229 10*3/uL (ref 150–400)
RBC: 4.17 MIL/uL (ref 3.87–5.11)
RDW: 15.2 % (ref 11.5–15.5)
WBC: 5.6 10*3/uL (ref 4.0–10.5)

## 2014-07-07 LAB — BLOOD GAS, ARTERIAL
Acid-base deficit: 0.5 mmol/L (ref 0.0–2.0)
BICARBONATE: 23.4 meq/L (ref 20.0–24.0)
DRAWN BY: 28340
FIO2: 1 %
O2 SAT: 92.8 %
Patient temperature: 98.6
TCO2: 20.7 mmol/L (ref 0–100)
pCO2 arterial: 38.1 mmHg (ref 35.0–45.0)
pH, Arterial: 7.405 (ref 7.350–7.450)
pO2, Arterial: 70.9 mmHg — ABNORMAL LOW (ref 80.0–100.0)

## 2014-07-07 LAB — BASIC METABOLIC PANEL
Anion gap: 13 (ref 5–15)
BUN: 17 mg/dL (ref 6–23)
CHLORIDE: 106 meq/L (ref 96–112)
CO2: 22 mEq/L (ref 19–32)
Calcium: 8.9 mg/dL (ref 8.4–10.5)
Creatinine, Ser: 1.09 mg/dL (ref 0.50–1.10)
GFR calc Af Amer: 58 mL/min — ABNORMAL LOW (ref >90)
GFR calc non Af Amer: 50 mL/min — ABNORMAL LOW (ref >90)
GLUCOSE: 117 mg/dL — AB (ref 70–99)
POTASSIUM: 3.6 meq/L — AB (ref 3.7–5.3)
Sodium: 141 mEq/L (ref 137–147)

## 2014-07-07 LAB — PROTIME-INR
INR: 2.66 — AB (ref 0.00–1.49)
Prothrombin Time: 28.5 seconds — ABNORMAL HIGH (ref 11.6–15.2)

## 2014-07-07 LAB — D-DIMER, QUANTITATIVE: D-Dimer, Quant: 0.53 ug/mL-FEU — ABNORMAL HIGH (ref 0.00–0.48)

## 2014-07-07 LAB — ETHANOL: Alcohol, Ethyl (B): <11 mg/dL (ref 0–11)

## 2014-07-07 MED ORDER — IPRATROPIUM-ALBUTEROL 0.5-2.5 (3) MG/3ML IN SOLN
3.0000 mL | Freq: Once | RESPIRATORY_TRACT | Status: AC
  Administered 2014-07-07: 3 mL via RESPIRATORY_TRACT
  Filled 2014-07-07: qty 3

## 2014-07-07 NOTE — ED Notes (Signed)
Patient transported to X-ray 

## 2014-07-07 NOTE — ED Notes (Signed)
Pt arrived tot he ED via EMS from [place] where she had hemoptysis.  Pt has a hx of pulmonary Embolism.  Pt at the time for EMS was coherent and lucid but as time has progressed pt's behavior has become erratic and disorganized.  Pt was told not to drink alcohol with her medication but by pt's account has had one [employer] cocktail at [employer].  Pt also has a cough which is producing blood and blood clots.  An attempt to call her daughter was unsuccessful as she gave the phone number to an older gentleman.

## 2014-07-07 NOTE — ED Provider Notes (Addendum)
CSN: [id number]     Arrival date & time 07/07/14  2052 History   First MD Initiated Contact with Patient 07/07/14 2142     Chief Complaint  Patient presents with  . Hemoptysis  . Altered Mental Status    HPI The patient presents to the emergency room with complaints of shortness of breath and hemoptysis. She was eating at a restaurant this evening when she suddenly started coughing and became short of breath.  She denies choking on the food at all. Patient noticed that she was coughing up blood. She denies any trouble with chest pain she has not had any trouble with abdominal pain. She has noticed some swelling in her feet.   Past Medical History  Diagnosis Date  . Dyspnea   . Pulmonary embolism   . Breast cyst     right  . Hypothyroidism   . Renal cyst   . Leukocytopenia   . Monoclonal gammopathy   . Osteoarthritis   . Schizoaffective disorder   . Wears dentures   . Pulmonary hypertension    Past Surgical History  Procedure Laterality Date  . Tubal ligation  1970  . Tubal ligation    . Breast surgery      cyst   Family History  Problem Relation Age of Onset  . Cancer Father     PROSTATE  . Atrial fibrillation Mother   . Alzheimer's disease Mother    History  Substance Use Topics  . Smoking status: Never Smoker   . Smokeless tobacco: Never Used  . Alcohol Use: No   OB History    No data available     Review of Systems  All other systems reviewed and are negative.     Allergies  Review of patient's allergies indicates no known allergies.  Home Medications   Prior to Admission medications   Medication Sig Start Date End Date Taking? Authorizing Provider  B Complex-C (B-COMPLEX WITH VITAMIN C) tablet Take 3 tablets by mouth daily.    Yes Historical Provider, MD  cholecalciferol (VITAMIN D) 1000 UNITS tablet Take 1,000 Units by mouth daily.   Yes Historical Provider, MD  divalproex (DEPAKOTE ER) 500 MG 24 hr tablet Take 500 mg by mouth daily.   Yes  Historical Provider, MD  levothyroxine (SYNTHROID, LEVOTHROID) 112 MCG tablet Take 112 mcg by mouth daily before breakfast.   Yes Historical Provider, MD  LORazepam (ATIVAN) 1 MG tablet Take 1 tablet by mouth 2 (two) times daily as needed for anxiety or sleep.  10/13/13  Yes Historical Provider, MD  Multiple Vitamin (MULTIVITAMIN WITH MINERALS) TABS tablet Take 1 tablet by mouth every morning.   Yes Historical Provider, MD  spironolactone (ALDACTONE) 50 MG tablet Take 25 mg by mouth daily.    Yes Historical Provider, MD  temazepam (RESTORIL) 15 MG capsule Take 15 mg by mouth at bedtime.    Yes Historical Provider, MD  traMADol (ULTRAM) 50 MG tablet Take 1 tablet (50 mg total) by mouth every 6 (six) hours as needed. 06/03/14  Yes Britt Bottom, NP  vitamin C (ASCORBIC ACID) 500 MG tablet Take 500 mg by mouth daily.    Yes Historical Provider, MD  warfarin (COUMADIN) 5 MG tablet Take 5 mg by mouth daily at 6 PM.   Yes Historical Provider, MD  bosentan (TRACLEER) 125 MG tablet Take 62.5 mg by mouth 2 (two) times daily.     Historical Provider, MD  Dextromethorphan HBr (ROBITUSSIN MAXIMUM STRENGTH PO) Take 10 mLs  by mouth as needed (for cold).     Historical Provider, MD  furosemide (LASIX) 20 MG tablet Take 20 mg by mouth daily.     Historical Provider, MD  Multiple Vitamins-Minerals ([employer] DAILY ENERGY) TABS Take by mouth daily.    Historical Provider, MD   BP 104/66 mmHg  Pulse 94  Temp(Src) 98.7 F (37.1 C) (Oral)  Resp 20  SpO2 93% Physical Exam  HENT:  Head: Normocephalic and atraumatic.  Right Ear: External ear normal.  Left Ear: External ear normal.  Eyes: Conjunctivae are normal. Right eye exhibits no discharge. Left eye exhibits no discharge. No scleral icterus.  Neck: Neck supple. No tracheal deviation present.  Cardiovascular: Normal rate, regular rhythm and intact distal pulses.   Pulmonary/Chest: Effort normal. No stridor. No respiratory distress. She has no wheezes. She has  rhonchi. She has no rales.  Frequent coughing  Abdominal: Soft. Bowel sounds are normal. She exhibits no distension. There is no tenderness. There is no rebound and no guarding.  Musculoskeletal: She exhibits no edema or tenderness.  Neurological: She is alert. She has normal strength. No cranial nerve deficit (no facial droop, extraocular movements intact, no slurred speech) or sensory deficit. She exhibits normal muscle tone. She displays no seizure activity. Coordination normal.  Skin: Skin is warm and dry. No rash noted. She is not diaphoretic.  Psychiatric: She has a normal mood and affect. Her mood appears not anxious. Her speech is tangential. Her speech is not rapid and/or pressured. Thought content is not delusional. She does not exhibit a depressed mood.  Nursing note and vitals reviewed.   ED Course  Procedures (including critical care time) Labs Review Labs Reviewed  BASIC METABOLIC PANEL - Abnormal; Notable for the following:    Potassium 3.6 (*)    Glucose, Bld 117 (*)    GFR calc non Af Amer 50 (*)    GFR calc Af Amer 58 (*)    All other components within normal limits  D-DIMER, QUANTITATIVE - Abnormal; Notable for the following:    D-Dimer, Quant 0.53 (*)    All other components within normal limits  PROTIME-INR - Abnormal; Notable for the following:    Prothrombin Time 28.5 (*)    INR 2.66 (*)    All other components within normal limits  BLOOD GAS, ARTERIAL - Abnormal; Notable for the following:    pO2, Arterial 70.9 (*)    All other components within normal limits  CBC  APTT  ETHANOL  PRO B NATRIURETIC PEPTIDE    Imaging Review Dg Chest 2 View  07/07/2014   CLINICAL DATA:  Initial evaluation for hemoptysis, shortness of breath.  EXAM: CHEST  2 VIEW  COMPARISON:  Prior radiograph from 06/03/2014  FINDINGS: Patient is rotated to the left. Allowing for rotation, cardiomegaly is stable. Mediastinal silhouette within normal limits.  Elevation left hemidiaphragm  stable from prior. There is associate left basilar atelectasis. No focal infiltrate, pulmonary edema, or pleural effusion. No pneumothorax.  No acute osseous abnormality.  IMPRESSION: 1. Elevation of the left hemidiaphragm with associated left basilar atelectasis, similar to prior. 2. No other acute cardiopulmonary abnormality. 3. Stable cardiomegaly without pulmonary edema.   Electronically Signed   By: Jeannine Boga M.D.   On: 07/07/2014 22:31     EKG Interpretation   Date/Time:  Saturday July 07 2014 21:13:43 EST Ventricular Rate:  86 PR Interval:  163 QRS Duration: 100 QT Interval:  404 QTC Calculation: 483 R Axis:   141  Text Interpretation:  Sinus rhythm Probable left atrial enlargement Right  axis deviation RSR' in V1 or V2, probably normal variant Nonspecific T  abnormalities, lateral leads inferior and anterior t wave changes resolved  since last tracing Confirmed by Kamora Vossler  MD-J, Donnica Jarnagin ([id number]) on 07/07/2014  9:47:51 PM     1204am  Pt is not coughing.  She is breathing more easily.  I reviewed old records.  She does have history of chronic dyspnea with her prior PE.  Plan on CT scan to evaluate for recurrent PE. MDM   Final diagnoses:  Shortness of breath    Pt with hemoptysis and dyspnea.  No hemoptysis noted while coughing in the ED.  Plan on CT scan of chest and reassess.  Will turn over case to Dr Wilburn Mylar, MD 07/08/14 0005  Dorie Rank, MD 07/08/14 [phone]

## 2014-07-07 NOTE — ED Notes (Signed)
Dr.Knapp at bedside  

## 2014-07-07 NOTE — ED Notes (Signed)
Bed: WA16 Expected date:  Expected time:  Means of arrival:  Comments: EMS 

## 2014-07-08 LAB — PRO B NATRIURETIC PEPTIDE: Pro B Natriuretic peptide (BNP): 5315 pg/mL — ABNORMAL HIGH (ref 0–125)

## 2014-07-08 LAB — VALPROIC ACID LEVEL: Valproic Acid Lvl: 10.8 ug/mL — ABNORMAL LOW (ref 50.0–100.0)

## 2014-07-08 MED ORDER — IOHEXOL 350 MG/ML SOLN
100.0000 mL | Freq: Once | INTRAVENOUS | Status: AC | PRN
  Administered 2014-07-08: 100 mL via INTRAVENOUS

## 2014-07-08 MED ORDER — ALBUTEROL SULFATE HFA 108 (90 BASE) MCG/ACT IN AERS: 1.0000 | INHALATION_SPRAY | Freq: Four times a day (QID) | RESPIRATORY_TRACT | Status: AC | PRN

## 2014-07-08 MED ORDER — AMOXICILLIN 500 MG PO CAPS: 500.0000 mg | ORAL_CAPSULE | Freq: Three times a day (TID) | ORAL | Status: DC

## 2014-07-08 NOTE — ED Notes (Signed)
Patient called her daughter several times without success.  Patient said, "Just call the $12 cab to take me to my car."

## 2014-07-08 NOTE — ED Notes (Addendum)
Patient transported to CT 

## 2014-07-08 NOTE — ED Notes (Signed)
Cab company notified.

## 2014-07-08 NOTE — ED Provider Notes (Addendum)
No CP.  States was wheezing no hemoptysis.  CT negative for PE and hemorrhage and PNA.  Now 97%.  AO3 at this time.  Will treat for bronchitis.  Strict return precautions for SOB and CP given.  Patient verbalizes understanding will follow up Monday with PMD.  Will treat for COPD flair with amoxicillin and albuterol  Rilan Eiland K Raiven Belizaire-Rasch, MD 07/08/14 0213  Carlisle Beers, MD 07/08/14 [phone]

## 2014-07-08 NOTE — ED Notes (Signed)
Back in room from CT

## 2014-07-13 ENCOUNTER — Telehealth: Payer: Self-pay | Admitting: Hematology

## 2014-07-13 ENCOUNTER — Other Ambulatory Visit: Payer: Self-pay

## 2014-07-13 DIAGNOSIS — D472 Monoclonal gammopathy: Secondary | ICD-10-CM

## 2014-07-13 NOTE — Telephone Encounter (Signed)
LM to confirm 07/17/14 appt d.t

## 2014-07-16 ENCOUNTER — Other Ambulatory Visit: Payer: Self-pay

## 2014-07-16 ENCOUNTER — Ambulatory Visit: Payer: Self-pay

## 2014-07-17 ENCOUNTER — Other Ambulatory Visit: Payer: Self-pay | Admitting: Nurse Practitioner

## 2014-07-17 ENCOUNTER — Telehealth: Payer: Self-pay | Admitting: Hematology

## 2014-07-17 ENCOUNTER — Other Ambulatory Visit (HOSPITAL_BASED_OUTPATIENT_CLINIC_OR_DEPARTMENT_OTHER): Payer: Medicare Other

## 2014-07-17 ENCOUNTER — Ambulatory Visit (HOSPITAL_BASED_OUTPATIENT_CLINIC_OR_DEPARTMENT_OTHER): Payer: Medicare Other | Admitting: Nurse Practitioner

## 2014-07-17 ENCOUNTER — Encounter: Payer: Medicare Other | Admitting: Nurse Practitioner

## 2014-07-17 DIAGNOSIS — R410 Disorientation, unspecified: Secondary | ICD-10-CM

## 2014-07-17 DIAGNOSIS — D472 Monoclonal gammopathy: Secondary | ICD-10-CM

## 2014-07-17 DIAGNOSIS — F259 Schizoaffective disorder, unspecified: Secondary | ICD-10-CM

## 2014-07-17 DIAGNOSIS — J9611 Chronic respiratory failure with hypoxia: Secondary | ICD-10-CM

## 2014-07-17 DIAGNOSIS — J9610 Chronic respiratory failure, unspecified whether with hypoxia or hypercapnia: Secondary | ICD-10-CM

## 2014-07-17 LAB — CBC WITH DIFFERENTIAL/PLATELET
BASO%: 0.5 % (ref 0.0–2.0)
Basophils Absolute: 0 10*3/uL (ref 0.0–0.1)
EOS ABS: 0.1 10*3/uL (ref 0.0–0.5)
EOS%: 1.4 % (ref 0.0–7.0)
HCT: 45.3 % (ref 34.8–46.6)
HGB: 14.6 g/dL (ref 11.6–15.9)
LYMPH%: 39.9 % (ref 14.0–49.7)
MCH: 30.8 pg (ref 25.1–34.0)
MCHC: 32.2 g/dL (ref 31.5–36.0)
MCV: 95.6 fL (ref 79.5–101.0)
MONO#: 0.3 10*3/uL (ref 0.1–0.9)
MONO%: 7.2 % (ref 0.0–14.0)
NEUT%: 51 % (ref 38.4–76.8)
NEUTROS ABS: 2.1 10*3/uL (ref 1.5–6.5)
PLATELETS: 279 10*3/uL (ref 145–400)
RBC: 4.74 10*6/uL (ref 3.70–5.45)
RDW: 15.5 % — ABNORMAL HIGH (ref 11.2–14.5)
WBC: 4.2 10*3/uL (ref 3.9–10.3)
lymph#: 1.7 10*3/uL (ref 0.9–3.3)

## 2014-07-17 LAB — COMPREHENSIVE METABOLIC PANEL (CC13)
ALK PHOS: 43 U/L (ref 40–150)
ALT: 30 U/L (ref 0–55)
AST: 28 U/L (ref 5–34)
Albumin: 3.7 g/dL (ref 3.5–5.0)
Anion Gap: 6 mEq/L (ref 3–11)
BUN: 20.4 mg/dL (ref 7.0–26.0)
CO2: 24 mEq/L (ref 22–29)
Calcium: 9.4 mg/dL (ref 8.4–10.4)
Chloride: 110 mEq/L — ABNORMAL HIGH (ref 98–109)
Creatinine: 1.2 mg/dL — ABNORMAL HIGH (ref 0.6–1.1)
Glucose: 94 mg/dl (ref 70–140)
Potassium: 4 mEq/L (ref 3.5–5.1)
SODIUM: 140 meq/L (ref 136–145)
TOTAL PROTEIN: 8.1 g/dL (ref 6.4–8.3)
Total Bilirubin: 0.76 mg/dL (ref 0.20–1.20)

## 2014-07-17 LAB — URINALYSIS, MICROSCOPIC - CHCC
BLOOD: NEGATIVE
Bilirubin (Urine): NEGATIVE
Glucose: NEGATIVE mg/dL
Ketones: NEGATIVE mg/dL
Leukocyte Esterase: NEGATIVE
NITRITE: NEGATIVE
PH: 5 (ref 4.6–8.0)
PROTEIN: NEGATIVE mg/dL
RBC / HPF: NEGATIVE (ref 0–2)
Specific Gravity, Urine: 1.015 (ref 1.003–1.035)
Urobilinogen, UR: 0.2 mg/dL (ref 0.2–1)

## 2014-07-17 NOTE — Telephone Encounter (Signed)
s.w. pt and advised on NOV appt....ok and aware

## 2014-07-18 ENCOUNTER — Emergency Department (HOSPITAL_COMMUNITY): Payer: Medicare Other

## 2014-07-18 ENCOUNTER — Other Ambulatory Visit: Payer: Self-pay

## 2014-07-18 ENCOUNTER — Ambulatory Visit (HOSPITAL_BASED_OUTPATIENT_CLINIC_OR_DEPARTMENT_OTHER): Payer: Medicare Other | Admitting: Nurse Practitioner

## 2014-07-18 ENCOUNTER — Inpatient Hospital Stay (HOSPITAL_COMMUNITY)
Admission: EM | Admit: 2014-07-18 | Discharge: 2014-07-23 | DRG: 885 | Disposition: A | Discharge status: Other Acute Inpatient Hospital | Payer: Medicare Other | Attending: Internal Medicine | Admitting: Internal Medicine

## 2014-07-18 ENCOUNTER — Encounter: Payer: Self-pay | Admitting: Nurse Practitioner

## 2014-07-18 ENCOUNTER — Encounter (HOSPITAL_COMMUNITY): Payer: Self-pay | Admitting: Emergency Medicine

## 2014-07-18 VITALS — BP 124/91 | HR 102 | Temp 98.0°F | Resp 16

## 2014-07-18 DIAGNOSIS — N183 Chronic kidney disease, stage 3 (moderate): Secondary | ICD-10-CM | POA: Diagnosis present

## 2014-07-18 DIAGNOSIS — J9610 Chronic respiratory failure, unspecified whether with hypoxia or hypercapnia: Secondary | ICD-10-CM

## 2014-07-18 DIAGNOSIS — Z86718 Personal history of other venous thrombosis and embolism: Secondary | ICD-10-CM

## 2014-07-18 DIAGNOSIS — Z9981 Dependence on supplemental oxygen: Secondary | ICD-10-CM

## 2014-07-18 DIAGNOSIS — J9621 Acute and chronic respiratory failure with hypoxia: Secondary | ICD-10-CM | POA: Diagnosis present

## 2014-07-18 DIAGNOSIS — I2782 Chronic pulmonary embolism: Secondary | ICD-10-CM | POA: Diagnosis present

## 2014-07-18 DIAGNOSIS — Z820 Family history of epilepsy and other diseases of the nervous system: Secondary | ICD-10-CM

## 2014-07-18 DIAGNOSIS — F22 Delusional disorders: Secondary | ICD-10-CM | POA: Diagnosis not present

## 2014-07-18 DIAGNOSIS — D472 Monoclonal gammopathy: Secondary | ICD-10-CM

## 2014-07-18 DIAGNOSIS — R4182 Altered mental status, unspecified: Secondary | ICD-10-CM | POA: Diagnosis not present

## 2014-07-18 DIAGNOSIS — F319 Bipolar disorder, unspecified: Secondary | ICD-10-CM | POA: Diagnosis present

## 2014-07-18 DIAGNOSIS — Z8042 Family history of malignant neoplasm of prostate: Secondary | ICD-10-CM

## 2014-07-18 DIAGNOSIS — J9611 Chronic respiratory failure with hypoxia: Secondary | ICD-10-CM | POA: Diagnosis present

## 2014-07-18 DIAGNOSIS — R0600 Dyspnea, unspecified: Secondary | ICD-10-CM

## 2014-07-18 DIAGNOSIS — Z9114 Patient's other noncompliance with medication regimen: Secondary | ICD-10-CM | POA: Diagnosis present

## 2014-07-18 DIAGNOSIS — Z7901 Long term (current) use of anticoagulants: Secondary | ICD-10-CM

## 2014-07-18 DIAGNOSIS — M1990 Unspecified osteoarthritis, unspecified site: Secondary | ICD-10-CM | POA: Diagnosis present

## 2014-07-18 DIAGNOSIS — E039 Hypothyroidism, unspecified: Secondary | ICD-10-CM | POA: Diagnosis present

## 2014-07-18 DIAGNOSIS — I2699 Other pulmonary embolism without acute cor pulmonale: Secondary | ICD-10-CM

## 2014-07-18 DIAGNOSIS — F259 Schizoaffective disorder, unspecified: Secondary | ICD-10-CM | POA: Diagnosis present

## 2014-07-18 DIAGNOSIS — Z79891 Long term (current) use of opiate analgesic: Secondary | ICD-10-CM

## 2014-07-18 DIAGNOSIS — G9340 Encephalopathy, unspecified: Secondary | ICD-10-CM | POA: Diagnosis present

## 2014-07-18 DIAGNOSIS — R0902 Hypoxemia: Secondary | ICD-10-CM

## 2014-07-18 DIAGNOSIS — I2724 Chronic thromboembolic pulmonary hypertension: Secondary | ICD-10-CM | POA: Diagnosis present

## 2014-07-18 DIAGNOSIS — I272 Other secondary pulmonary hypertension: Secondary | ICD-10-CM | POA: Diagnosis present

## 2014-07-18 DIAGNOSIS — I5032 Chronic diastolic (congestive) heart failure: Secondary | ICD-10-CM | POA: Diagnosis present

## 2014-07-18 DIAGNOSIS — Z79899 Other long term (current) drug therapy: Secondary | ICD-10-CM

## 2014-07-18 LAB — URINALYSIS, ROUTINE W REFLEX MICROSCOPIC
Bilirubin Urine: NEGATIVE
GLUCOSE, UA: NEGATIVE mg/dL
Hgb urine dipstick: NEGATIVE
KETONES UR: NEGATIVE mg/dL
LEUKOCYTES UA: NEGATIVE
Nitrite: NEGATIVE
PH: 5.5 (ref 5.0–8.0)
Protein, ur: NEGATIVE mg/dL
SPECIFIC GRAVITY, URINE: 1.005 (ref 1.005–1.030)
Urobilinogen, UA: 0.2 mg/dL (ref 0.0–1.0)

## 2014-07-18 LAB — CBC WITH DIFFERENTIAL/PLATELET
Basophils Absolute: 0 10*3/uL (ref 0.0–0.1)
Basophils Relative: 1 % (ref 0–1)
EOS ABS: 0.1 10*3/uL (ref 0.0–0.7)
Eosinophils Relative: 1 % (ref 0–5)
HCT: 43.9 % (ref 36.0–46.0)
Hemoglobin: 14.6 g/dL (ref 12.0–15.0)
LYMPHS PCT: 47 % — AB (ref 12–46)
Lymphs Abs: 2 10*3/uL (ref 0.7–4.0)
MCH: 31.8 pg (ref 26.0–34.0)
MCHC: 33.3 g/dL (ref 30.0–36.0)
MCV: 95.6 fL (ref 78.0–100.0)
MONOS PCT: 6 % (ref 3–12)
Monocytes Absolute: 0.2 10*3/uL (ref 0.1–1.0)
Neutro Abs: 1.8 10*3/uL (ref 1.7–7.7)
Neutrophils Relative %: 45 % (ref 43–77)
Platelets: 279 10*3/uL (ref 150–400)
RBC: 4.59 MIL/uL (ref 3.87–5.11)
RDW: 15.2 % (ref 11.5–15.5)
WBC: 4.1 10*3/uL (ref 4.0–10.5)

## 2014-07-18 LAB — BASIC METABOLIC PANEL
Anion gap: 10 (ref 5–15)
BUN: 22 mg/dL (ref 6–23)
CO2: 23 mEq/L (ref 19–32)
Calcium: 9.6 mg/dL (ref 8.4–10.5)
Chloride: 103 mEq/L (ref 96–112)
Creatinine, Ser: 1.13 mg/dL — ABNORMAL HIGH (ref 0.50–1.10)
GFR calc Af Amer: 55 mL/min — ABNORMAL LOW (ref >90)
GFR calc non Af Amer: 48 mL/min — ABNORMAL LOW (ref >90)
GLUCOSE: 90 mg/dL (ref 70–99)
POTASSIUM: 3.7 meq/L (ref 3.7–5.3)
Sodium: 136 mEq/L — ABNORMAL LOW (ref 137–147)

## 2014-07-18 LAB — BLOOD GAS, ARTERIAL
Acid-base deficit: 1.2 mmol/L (ref 0.0–2.0)
BICARBONATE: 22.9 meq/L (ref 20.0–24.0)
DRAWN BY: 103701
O2 Content: 3 L/min
O2 Saturation: 86.2 %
PCO2 ART: 38.6 mmHg (ref 35.0–45.0)
PO2 ART: 55.2 mmHg — AB (ref 80.0–100.0)
Patient temperature: 98.6
TCO2: 20.4 mmol/L (ref 0–100)
pH, Arterial: 7.391 (ref 7.350–7.450)

## 2014-07-18 LAB — PROTIME-INR
INR: 2.29 — ABNORMAL HIGH (ref 0.00–1.49)
PROTHROMBIN TIME: 25.4 s — AB (ref 11.6–15.2)

## 2014-07-18 LAB — PRO B NATRIURETIC PEPTIDE: PRO B NATRI PEPTIDE: 6961 pg/mL — AB (ref 0–125)

## 2014-07-18 LAB — VALPROIC ACID LEVEL

## 2014-07-18 LAB — TROPONIN I: Troponin I: <0.3 ng/mL (ref <0.30)

## 2014-07-18 LAB — URINE CULTURE

## 2014-07-18 MED ORDER — B COMPLEX-C PO TABS
1.0000 | ORAL_TABLET | Freq: Every day | ORAL | Status: DC
  Administered 2014-07-21 – 2014-07-23 (×3): 1 via ORAL
  Filled 2014-07-18 (×5): qty 1

## 2014-07-18 MED ORDER — LEVOTHYROXINE SODIUM 112 MCG PO TABS
112.0000 ug | ORAL_TABLET | Freq: Every day | ORAL | Status: DC
  Administered 2014-07-19 – 2014-07-20 (×2): 112 ug via ORAL
  Filled 2014-07-18 (×3): qty 1

## 2014-07-18 MED ORDER — DIVALPROEX SODIUM ER 500 MG PO TB24
500.0000 mg | ORAL_TABLET | Freq: Every day | ORAL | Status: DC
  Filled 2014-07-18 (×2): qty 1

## 2014-07-18 MED ORDER — VITAMIN C 500 MG PO TABS
500.0000 mg | ORAL_TABLET | Freq: Every day | ORAL | Status: DC
Start: 2014-07-19 — End: 2014-07-23
  Administered 2014-07-21 – 2014-07-23 (×3): 500 mg via ORAL
  Filled 2014-07-18 (×5): qty 1

## 2014-07-18 MED ORDER — ADULT MULTIVITAMIN W/MINERALS CH
1.0000 | ORAL_TABLET | Freq: Every morning | ORAL | Status: DC
  Administered 2014-07-21 – 2014-07-23 (×3): 1 via ORAL
  Filled 2014-07-18 (×5): qty 1

## 2014-07-18 MED ORDER — SODIUM CHLORIDE 0.9 % IJ SOLN
3.0000 mL | Freq: Two times a day (BID) | INTRAMUSCULAR | Status: DC
  Administered 2014-07-18: 3 mL via INTRAVENOUS

## 2014-07-18 MED ORDER — SODIUM CHLORIDE 0.9 % IJ SOLN: 3.0000 mL | INTRAMUSCULAR | Status: DC | PRN

## 2014-07-18 MED ORDER — ALBUTEROL SULFATE (2.5 MG/3ML) 0.083% IN NEBU: 3.0000 mL | INHALATION_SOLUTION | Freq: Four times a day (QID) | RESPIRATORY_TRACT | Status: DC | PRN

## 2014-07-18 MED ORDER — BOSENTAN 62.5 MG PO TABS: 62.5000 mg | ORAL_TABLET | Freq: Two times a day (BID) | ORAL | Status: DC

## 2014-07-18 MED ORDER — SODIUM CHLORIDE 0.9 % IV SOLN: 250.0000 mL | INTRAVENOUS | Status: DC | PRN
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
## 2014-07-18 NOTE — Progress Notes (Signed)
ANTICOAGULATION CONSULT NOTE - Initial Consult  Pharmacy Consult for Warfarin Indication: History of PE  No Known Allergies  Patient Measurements: Height: 5\' 1"  (154.9 cm) Weight: 130 lb 11.2 oz (59.285 kg) IBW/kg (Calculated) : 47.8  Vital Signs: Temp: 98.2 F (36.8 C) (11/18 1602) Temp Source: Oral (11/18 1602) BP: 114/84 mmHg (11/18 1602) Pulse Rate: 81 (11/18 1602)  Labs:  Recent Labs  07/17/14 1011 07/17/14 1011 07/18/14 1246 07/18/14 1248  HGB 14.6  --  14.6  --   HCT 45.3  --  43.9  --   PLT 279  --  279  --   LABPROT  --   --   --  25.4*  INR  --   --   --  2.29*  CREATININE  --  1.2* 1.13*  --   TROPONINI  --   --  <0.30  --     Estimated Creatinine Clearance: 37.8 mL/min (by C-G formula based on Cr of 1.13).   Medical History: Past Medical History  Diagnosis Date  . Dyspnea   . Pulmonary embolism   . Breast cyst     right  . Hypothyroidism   . Renal cyst   . Leukocytopenia   . Monoclonal gammopathy   . Osteoarthritis   . Schizoaffective disorder   . Wears dentures   . Pulmonary hypertension     Medications:  Scheduled:  . B-complex with vitamin C  1 tablet Oral Daily  . bosentan  62.5 mg Oral BID  . divalproex  500 mg Oral Daily  . [START ON 07/19/2014] levothyroxine  112 mcg Oral QAC breakfast  . [START ON 07/19/2014] multivitamin with minerals  1 tablet Oral q morning - 10a  . sodium chloride  3 mL Intravenous Q12H  . vitamin C  500 mg Oral Daily   Infusions:   PRN: sodium chloride, albuterol, sodium chloride  Assessment: 73 yo female with pulmonary hypertension presents with acute encephalopathy. On chronic warfarin for history of PTA.   Dose reported as 5mg  daily - had dose this AM prior to admit  INR therapeutic 2.29  CBC wnl  No bleeding currently reported  No drug interactions present  Heart healthy diet ordered  Goal of Therapy:  INR 2-3   Plan:   No warfarin tonight as patient has already taken her dose  today  Daily PT/INR  Peggyann Juba, PharmD, BCPS Pager: [phone] 07/18/2014,4:05 PM

## 2014-07-18 NOTE — Progress Notes (Signed)
Asencion Gowda, pt's daughter and POA has come in to visit the pt. Advance Directives brought in by daughter and copy made by this RN and placed in chart. Daughter [name] also took all of pt's belongings at this time, including jewelry, clothing, cane, medications, purse. Medication reconciliation also updated, as well as contact information for family with the daughter at this time. [name] states that the best way to contact her is through her husband Dorothy Spark.

## 2014-07-18 NOTE — ED Notes (Signed)
Called respiratory for arterial blood gas

## 2014-07-18 NOTE — ED Notes (Signed)
MD at bedside with ultrasound

## 2014-07-18 NOTE — Progress Notes (Signed)
SYMPTOM MANAGEMENT CLINIC   HPI: Michaela Horton 73 y.o. female diagnosed with monoclonal gammopathy.  Here today for six-month followup.  Patient presented to the [facility] today approximately 4 hours early for her six-month followup appointment.  She states she's been doing fairly well within this past 6 months.  She states she lives alone; and is very active on a daily basis.  She states that she continues to drive herself; and is fairly independent.  Patient has a history of chronic respiratory failure, pulmonary embolism, and pulmonary hypertension in the past.  Patient presented to the [facility] today with her home O2 tank; but was not wearing any O2 via nasal cannula on exam today.  Patient denied any shortness of breath; and did not appear in any respiratory distress on exam.  Patient was very unclear if she was actually taking the Coumadin on a daily basis as previously directed.  Patient has a history of chronic psychiatric issues.  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  She reports that she has been hospitalized in psychiatric facilities several times in the past.  Patient states she stopped taking lithium several months ago.  Patient was causing some disruption in the [facility] waiting room initially; and talking and yelling to several different people.  She was at times making very inappropriate comments in the presence of other patients.  However, patient did continue very agreeable; and was able to follow all commands.  HPI  CURRENT THERAPY: No active treatment plan for patient.   ROS  Past Medical History  Diagnosis Date  . Dyspnea   . Pulmonary embolism   . Breast cyst     right  . Hypothyroidism   . Renal cyst   . Leukocytopenia   . Monoclonal gammopathy   . Osteoarthritis   . Schizoaffective disorder   . Wears dentures   .  Pulmonary hypertension     Past Surgical History  Procedure Laterality Date  . Tubal ligation  1970  . Tubal ligation    . Breast surgery      cyst    has Hypothyroidism; MONOCLONAL GAMMOPATHY; LEUKOCYTOPENIA UNSPECIFIED; Schizoaffective disorder, unspecified type; RENAL CYST, RIGHT; BREAST CYST, RIGHT; OSTEOARTHRITIS; Chronic thromboembolic pulmonary hypertension; Chronic respiratory failure with hypoxia; Subdural hematoma, post-traumatic; Acute kidney injury; Bipolar disorder; Altered mental state; Drug overdose; Aggressive behavior, adult; Adjustment disorder with mixed disturbance of emotions and conduct; Adjustment disorder with disturbance of conduct; Hypoxia; and Acute encephalopathy on her problem list.     has No Known Allergies.    Medication List       This list is accurate as of: 07/17/14 11:59 PM.  Always use your most recent med list.               albuterol 108 (90 BASE) MCG/ACT inhaler  Commonly known as:  PROVENTIL HFA;VENTOLIN HFA  Inhale 1-2 puffs into the lungs every 6 (six) hours as needed for wheezing or shortness of breath.     amoxicillin 500 MG capsule  Commonly known as:  AMOXIL  Take 1 capsule (500 mg total) by mouth 3 (three) times daily.     B-complex with vitamin C tablet  Take 1 tablet by mouth daily.     bosentan 125 MG tablet  Commonly known as:  TRACLEER  Take 62.5 mg by mouth 2 (two) times daily.     cholecalciferol 1000 UNITS  tablet  Commonly known as:  VITAMIN D  Take 1,000 Units by mouth daily.     [employer] DAILY ENERGY Tabs  Take by mouth daily.     divalproex 500 MG 24 hr tablet  Commonly known as:  DEPAKOTE ER  Take 500 mg by mouth daily.     furosemide 20 MG tablet  Commonly known as:  LASIX  Take 20 mg by mouth daily.     levothyroxine 112 MCG tablet  Commonly known as:  SYNTHROID, LEVOTHROID  Take 112 mcg by mouth daily before breakfast.     LORazepam 1 MG tablet  Commonly known as:  ATIVAN  Take 1 tablet by mouth 2  (two) times daily as needed for anxiety or sleep.     multivitamin with minerals Tabs tablet  Take 1 tablet by mouth every morning.     ROBITUSSIN MAXIMUM STRENGTH PO  Take 10 mLs by mouth as needed (for cold).     spironolactone 50 MG tablet  Commonly known as:  ALDACTONE  Take 25 mg by mouth daily.     temazepam 15 MG capsule  Commonly known as:  RESTORIL  Take 15 mg by mouth at bedtime.     traMADol 50 MG tablet  Commonly known as:  ULTRAM  Take 1 tablet (50 mg total) by mouth every 6 (six) hours as needed.     vitamin C 500 MG tablet  Commonly known as:  ASCORBIC ACID  Take 500 mg by mouth daily.     warfarin 5 MG tablet  Commonly known as:  COUMADIN  Take 5 mg by mouth daily at 6 PM.         PHYSICAL EXAMINATION  Vitals: BP: 128/88, HR 83, temp 98.0 sat 94 % on room air.   Physical Exam  Constitutional: She is well-developed, well-nourished, and in no distress.  HENT:  Head: Normocephalic and atraumatic.  Mouth/Throat: Oropharynx is clear and moist. No oropharyngeal exudate.  Eyes: Conjunctivae and EOM are normal. Pupils are equal, round, and reactive to light. Right eye exhibits no discharge. Left eye exhibits no discharge. No scleral icterus.  Neck: Normal range of motion. Neck supple. No JVD present. No tracheal deviation present. No thyromegaly present.  Cardiovascular: Normal rate, regular rhythm, normal heart sounds and intact distal pulses.   Pulmonary/Chest: Effort normal and breath sounds normal. No respiratory distress. She has no wheezes. She has no rales.  Abdominal: Soft. Bowel sounds are normal. She exhibits no distension. There is no tenderness. There is no rebound.  Musculoskeletal: Normal range of motion. She exhibits no edema or tenderness.  Lymphadenopathy:    She has no cervical adenopathy.  Neurological: She has normal reflexes. Gait normal.  Patient was able to follow simple commands and answers questions appropriately; but continually  fluctuated between loud, aggressive comments and some anger in tears.  Skin: Skin is warm and dry. No rash noted. No erythema.  Psychiatric:  See previous notes regarding patient's neurological/psych status.  Nursing note and vitals reviewed.   LABORATORY DATA:. Appointment on 07/17/2014  Component Date Value Ref Range Status  . WBC 07/17/2014 4.2  3.9 - 10.3 10e3/uL Final  . NEUT# 07/17/2014 2.1  1.5 - 6.5 10e3/uL Final  . HGB 07/17/2014 14.6  11.6 - 15.9 g/dL Final  . HCT 07/17/2014 45.3  34.8 - 46.6 % Final  . Platelets 07/17/2014 279  145 - 400 10e3/uL Final  . MCV 07/17/2014 95.6  79.5 - 101.0 fL Final  .  MCH 07/17/2014 30.8  25.1 - 34.0 pg Final  . MCHC 07/17/2014 32.2  31.5 - 36.0 g/dL Final  . RBC 07/17/2014 4.74  3.70 - 5.45 10e6/uL Final  . RDW 07/17/2014 15.5* 11.2 - 14.5 % Final  . lymph# 07/17/2014 1.7  0.9 - 3.3 10e3/uL Final  . MONO# 07/17/2014 0.3  0.1 - 0.9 10e3/uL Final  . Eosinophils Absolute 07/17/2014 0.1  0.0 - 0.5 10e3/uL Final  . Basophils Absolute 07/17/2014 0.0  0.0 - 0.1 10e3/uL Final  . NEUT% 07/17/2014 51.0  38.4 - 76.8 % Final  . LYMPH% 07/17/2014 39.9  14.0 - 49.7 % Final  . MONO% 07/17/2014 7.2  0.0 - 14.0 % Final  . EOS% 07/17/2014 1.4  0.0 - 7.0 % Final  . BASO% 07/17/2014 0.5  0.0 - 2.0 % Final  . Sodium 07/17/2014 140  136 - 145 mEq/L Final  . Potassium 07/17/2014 4.0  3.5 - 5.1 mEq/L Final  . Chloride 07/17/2014 110* 98 - 109 mEq/L Final  . CO2 07/17/2014 24  22 - 29 mEq/L Final  . Glucose 07/17/2014 94  70 - 140 mg/dl Final  . BUN 07/17/2014 20.4  7.0 - 26.0 mg/dL Final  . Creatinine 07/17/2014 1.2* 0.6 - 1.1 mg/dL Final  . Total Bilirubin 07/17/2014 0.76  0.20 - 1.20 mg/dL Final  . Alkaline Phosphatase 07/17/2014 43  40 - 150 U/L Final  . AST 07/17/2014 28  5 - 34 U/L Final  . ALT 07/17/2014 30  0 - 55 U/L Final  . Total Protein 07/17/2014 8.1  6.4 - 8.3 g/dL Final  . Albumin 07/17/2014 3.7  3.5 - 5.0 g/dL Final  . Calcium 07/17/2014  9.4  8.4 - 10.4 mg/dL Final  . Anion Gap 07/17/2014 6  3 - 11 mEq/L Final  . Glucose 07/17/2014 Negative  Negative mg/dL Final  . Bilirubin (Urine) 07/17/2014 Negative  Negative Final  . Ketones 07/17/2014 Negative  Negative mg/dL Final  . Specific Gravity, Urine 07/17/2014 1.015  1.003 - 1.035 Final  . Blood 07/17/2014 Negative  Negative Final  . pH 07/17/2014 5.0  4.6 - 8.0 Final  . Protein 07/17/2014 Negative  Negative- <30 mg/dL Final  . Urobilinogen, UR 07/17/2014 0.2  0.2 - 1 mg/dL Final  . Nitrite 07/17/2014 Negative  Negative Final  . Leukocyte Esterase 07/17/2014 Negative  Negative Final  . RBC / HPF 07/17/2014 Negative  0 - 2 Final  . WBC, UA 07/17/2014 0-2  0 - 2 Final  . Bacteria, UA 07/17/2014 Few  Negative- Trace Final  . Epithelial Cells 07/17/2014 Moderate  Negative- Few Final  . Urine Culture, Routine 07/17/2014 Culture, Urine   Final   Comment: Final - ===== COLONY COUNT: ===== NO GROWTH NO GROWTH      RADIOGRAPHIC STUDIES: Ct Head Wo Contrast  07/18/2014   CLINICAL DATA:  Altered mental status, bilateral foot pain/numbness  EXAM: CT HEAD WITHOUT CONTRAST  TECHNIQUE: Contiguous axial images were obtained from the base of the skull through the vertex without intravenous contrast.  COMPARISON:  MRI brain dated 10/16/2013  FINDINGS: No evidence of parenchymal hemorrhage or extra-axial fluid collection. No mass lesion, mass effect, or midline shift.  No CT evidence of acute infarction.  Subcortical white matter and periventricular small vessel ischemic changes. Mild intracranial atherosclerosis.  Age related volume loss.  No ventriculomegaly.  The visualized paranasal sinuses are essentially clear. The mastoid air cells are unopacified.  No evidence of calvarial fracture.  IMPRESSION: No evidence  of acute intracranial abnormality.  Mild small vessel ischemic changes.   Electronically Signed   By: Julian Hy M.D.   On: 07/18/2014 13:46   Dg Chest Port 1  View  07/18/2014   CLINICAL DATA:  Dyspnea, patient fell fell asleep on the way to the hospital  EXAM: PORTABLE CHEST - 1 VIEW  COMPARISON:  CT chest 07/08/2014  FINDINGS: There is elevation of the left diaphragm. There is left basilar atelectasis. There is no focal parenchymal opacity, pleural effusion, or pneumothorax. The heart and mediastinal contours are unremarkable.  The osseous structures are unremarkable.  IMPRESSION: No active disease.   Electronically Signed   By: Kathreen Devoid   On: 07/18/2014 13:16    ASSESSMENT/PLAN:    Monoclonal gammopathy: reviewed all of patient's labs with the patient in detail today.  Monoclonal gammopathy light chain lab results still pending.  Advised patient that would arrange for followup within this next week here at the [facility] for review of her outstanding lab results.  Schizoaffective disorder: patient reports that she has stopped taking lithium for the past few months.  She is actually unclear which of her psych meds she is asked to take it at present time.  She admits to missing or counseling her most recent psychiatric followup appointments.  Patient has been followed in the past per Dr. Casimiro Needle. Patient was advised to followup once again with her psychiatrist for further evaluation and treatment.  Chronic respiratory failure: patient has a history of chronic home O2 use.She states she alternates between 2 L to 4 L oxygen on a regular basis via nasal cannula. However, patient was not wearing any oxygen on exam today; and appeared with no acute respiratory distress whatsoever.  Patient did pull her O2 tank along behind her while in the [facility].  Patient stated that she carries her O2 tank with her "in case she needs it".    Patient stated understanding of all instructions; and was in agreement with this plan of care. The patient knows to call the clinic with any problems, questions or concerns.   Review/collaboration with Dr. Burr Medico regarding all  aspects of patient's visit today.   Total time spent with patient was 40 minutes;  with greater than 80 percent of that time spent in face to face counseling regarding her symptoms, and coordination of care and follow up.  Disclaimer: This note was dictated with voice recognition software. Similar sounding words can inadvertently be transcribed and may not be corrected upon review.   Drue Second, NP 07/18/2014

## 2014-07-18 NOTE — ED Notes (Signed)
Report given to [facility] RN

## 2014-07-18 NOTE — Progress Notes (Signed)
Spoke with the MD to inform them that pt is refusing all medications. Also it is clarified that this pt doesn't need telemetry monitoring at this time. Will continue to monitor.

## 2014-07-18 NOTE — ED Provider Notes (Signed)
CSN: [id number]     Arrival date & time 07/18/14  1202 History   First MD Initiated Contact with Patient 07/18/14 1213     Chief Complaint  Patient presents with  . Altered Mental Status     (Consider location/radiation/quality/duration/timing/severity/associated sxs/prior Treatment) HPI Comments: 73 year old female with schizoaffective disorder, chronic pulmonary hypertension, pulmonary embolism, chronic respiratory failure, home oxygen 4 L at night 2 L during the day, adjustment disorder, drug overdose presents from the cancer center with confusion and hypoxia. Patient went to an appointment that she thought she had however she did not have a scheduled appointment. This the second time she has done this. Patient says she took 2 doses of her Ativan prior to going the cancer center because she wanted a stronger effect for anxiety prior to her appointment. Patient arrived in the ER and auction tank was empty, oxygen 70s saturation.  Patient is a 73 y.o. female presenting with altered mental status. The history is provided by the patient and medical records.  Altered Mental Status Presenting symptoms: confusion   Associated symptoms: no abdominal pain, no fever, no headaches, no light-headedness, no rash and no vomiting     Past Medical History  Diagnosis Date  . Dyspnea   . Pulmonary embolism   . Breast cyst     right  . Hypothyroidism   . Renal cyst   . Leukocytopenia   . Monoclonal gammopathy   . Osteoarthritis   . Schizoaffective disorder   . Wears dentures   . Pulmonary hypertension    Past Surgical History  Procedure Laterality Date  . Tubal ligation  1970  . Tubal ligation    . Breast surgery      cyst   Family History  Problem Relation Age of Onset  . Cancer Father     PROSTATE  . Atrial fibrillation Mother   . Alzheimer's disease Mother    History  Substance Use Topics  . Smoking status: Never Smoker   . Smokeless tobacco: Never Used  . Alcohol Use: Yes      Comment: 07/18/14 -"I am drinking now"   OB History    No data available     Review of Systems  Constitutional: Positive for fatigue. Negative for fever and chills.  HENT: Negative for congestion.   Eyes: Negative for visual disturbance.  Respiratory: Positive for cough and shortness of breath.   Cardiovascular: Negative for chest pain and leg swelling.  Gastrointestinal: Negative for vomiting and abdominal pain.  Genitourinary: Negative for dysuria and flank pain.  Musculoskeletal: Negative for back pain, neck pain and neck stiffness.  Skin: Negative for rash.  Neurological: Negative for light-headedness and headaches.  Psychiatric/Behavioral: Positive for confusion.      Allergies  Review of patient's allergies indicates no known allergies.  Home Medications   Prior to Admission medications   Medication Sig Start Date End Date Taking? Authorizing Provider  albuterol (PROVENTIL HFA;VENTOLIN HFA) 108 (90 BASE) MCG/ACT inhaler Inhale 1-2 puffs into the lungs every 6 (six) hours as needed for wheezing or shortness of breath. 07/08/14  Yes April K Palumbo-Rasch, MD  B Complex-C (B-COMPLEX WITH VITAMIN C) tablet Take 1 tablet by mouth daily.    Yes Historical Provider, MD  bosentan (TRACLEER) 125 MG tablet Take 62.5 mg by mouth 2 (two) times daily.    Yes Historical Provider, MD  cholecalciferol (VITAMIN D) 1000 UNITS tablet Take 1,000 Units by mouth daily.   Yes Historical Provider, MD  Dextromethorphan HBr (ROBITUSSIN  MAXIMUM STRENGTH PO) Take 10 mLs by mouth as needed (for cold).    Yes Historical Provider, MD  furosemide (LASIX) 20 MG tablet Take 20 mg by mouth daily.    Yes Historical Provider, MD  levothyroxine (SYNTHROID, LEVOTHROID) 112 MCG tablet Take 112 mcg by mouth daily before breakfast.   Yes Historical Provider, MD  LORazepam (ATIVAN) 1 MG tablet Take 1 tablet by mouth 2 (two) times daily as needed for anxiety or sleep.  10/13/13  Yes Historical Provider, MD  Multiple  Vitamin (MULTIVITAMIN WITH MINERALS) TABS tablet Take 1 tablet by mouth every morning.   Yes Historical Provider, MD  temazepam (RESTORIL) 15 MG capsule Take 15 mg by mouth at bedtime.    Yes Historical Provider, MD  vitamin C (ASCORBIC ACID) 500 MG tablet Take 500 mg by mouth daily.    Yes Historical Provider, MD  warfarin (COUMADIN) 5 MG tablet Take 5 mg by mouth daily at 6 PM.   Yes Historical Provider, MD  amoxicillin (AMOXIL) 500 MG capsule Take 1 capsule (500 mg total) by mouth 3 (three) times daily. Patient not taking: Reported on 07/18/2014 07/08/14   April K Palumbo-Rasch, MD  divalproex (DEPAKOTE ER) 500 MG 24 hr tablet Take 500 mg by mouth daily.    Historical Provider, MD  traMADol (ULTRAM) 50 MG tablet Take 1 tablet (50 mg total) by mouth every 6 (six) hours as needed. 06/03/14   Britt Bottom, NP   BP 114/84 mmHg  Pulse 81  Temp(Src) 98.2 F (36.8 C) (Oral)  Resp 18  Ht 5\' 1"  (1.549 m)  Wt 130 lb 11.2 oz (59.285 kg)  BMI 24.71 kg/m2  SpO2 95% Physical Exam  Constitutional: She appears well-developed and well-nourished.  HENT:  Head: Normocephalic and atraumatic.  Eyes: Right eye exhibits no discharge. Left eye exhibits no discharge.  Neck: Normal range of motion. Neck supple. No tracheal deviation present.  Cardiovascular: Normal rate and regular rhythm.   Pulmonary/Chest: No respiratory distress (mild tachypnea). She has no wheezes. She has rales (few sparse right lung, decreased air movement bases bilateral).  Abdominal: Soft. She exhibits no distension. There is no tenderness. There is no guarding.  Musculoskeletal: She exhibits no edema.  Neurological: She is alert. GCS eye subscore is 4. GCS verbal subscore is 5. GCS motor subscore is 6.  Patient moving all extremities equal bilateral, no focal deficit, sensation grossly intact palpation, finger nose intact, patient alert and oriented however intermittently will start crying for no reason and joking the next minute.  No slurred speech appreciated. Pupils equal eczema the muscle function intact.  Skin: Skin is warm. No rash noted.  Psychiatric:  Patient intermittently joking and laughing followed by tears.   Nursing note and vitals reviewed.   ED Course  Procedures (including critical care time)   EMERGENCY DEPARTMENT [name] CARDIAC EXAM "Study: Limited Ultrasound of the heart and pericardium"  INDICATIONS:Dyspnea Multiple views of the heart and pericardium were obtained in real-time with a multi-frequency probe.  PERFORMED NT:[id number]  IMAGES ARCHIVED?: Yes  FINDINGS: No pericardial effusion, Decreased contractility, IVC dilated and Tamponade physiology absent  LIMITATIONS:  Body habitus  VIEWS USED: Subcostal 4 chamber, Parasternal long axis, Parasternal short axis, Apical 4 chamber  and Inferior Vena Cava  INTERPRETATION: Cardiac activity present, Pericardial effusioin absent, Cardiac tamponade absent and Probable elevated CVP  enlarged right heart  Emergency Ultrasound: Limited Thoracic Performed and interpreted by Dr Reather Converse Longitudinal view of anterior left and right lung fields in real-time  with linear probe. Indication: dyspnea Findings: pos lung sliding mild B lines Interpretation: no evidence of pneumothorax. Images electronically archived.      Labs Review Labs Reviewed  BASIC METABOLIC PANEL - Abnormal; Notable for the following:    Sodium 136 (*)    Creatinine, Ser 1.13 (*)    GFR calc non Af Amer 48 (*)    GFR calc Af Amer 55 (*)    All other components within normal limits  CBC WITH DIFFERENTIAL - Abnormal; Notable for the following:    Lymphocytes Relative 47 (*)    All other components within normal limits  BLOOD GAS, ARTERIAL - Abnormal; Notable for the following:    pO2, Arterial 55.2 (*)    All other components within normal limits  PRO B NATRIURETIC PEPTIDE - Abnormal; Notable for the following:    Pro B Natriuretic peptide (BNP) 6961.0 (*)    All other  components within normal limits  PROTIME-INR - Abnormal; Notable for the following:    Prothrombin Time 25.4 (*)    INR 2.29 (*)    All other components within normal limits  TROPONIN I  URINALYSIS, ROUTINE W REFLEX MICROSCOPIC  VALPROIC ACID LEVEL    Imaging Review Ct Head Wo Contrast  07/18/2014   CLINICAL DATA:  Altered mental status, bilateral foot pain/numbness  EXAM: CT HEAD WITHOUT CONTRAST  TECHNIQUE: Contiguous axial images were obtained from the base of the skull through the vertex without intravenous contrast.  COMPARISON:  MRI brain dated 10/16/2013  FINDINGS: No evidence of parenchymal hemorrhage or extra-axial fluid collection. No mass lesion, mass effect, or midline shift.  No CT evidence of acute infarction.  Subcortical white matter and periventricular small vessel ischemic changes. Mild intracranial atherosclerosis.  Age related volume loss.  No ventriculomegaly.  The visualized paranasal sinuses are essentially clear. The mastoid air cells are unopacified.  No evidence of calvarial fracture.  IMPRESSION: No evidence of acute intracranial abnormality.  Mild small vessel ischemic changes.   Electronically Signed   By: Julian Hy M.D.   On: 07/18/2014 13:46   Dg Chest Port 1 View  07/18/2014   CLINICAL DATA:  Dyspnea, patient fell fell asleep on the way to the hospital  EXAM: PORTABLE CHEST - 1 VIEW  COMPARISON:  CT chest 07/08/2014  FINDINGS: There is elevation of the left diaphragm. There is left basilar atelectasis. There is no focal parenchymal opacity, pleural effusion, or pneumothorax. The heart and mediastinal contours are unremarkable.  The osseous structures are unremarkable.  IMPRESSION: No active disease.   Electronically Signed   By: Kathreen Devoid   On: 07/18/2014 13:16     EKG Interpretation None     EKG reviewed heart rate 86 sinus, nonspecific ST changes, worsening T-wave inversion V1, normal QT, right axis deviation. MDM   Final diagnoses:  Dyspnea   Altered mental status, unspecified altered mental status type   Patient presents with confusion and hypoxia., complicated medical history cleaning blood clots and chronic lung disease and pulmonary hypertension. Bedside ultrasound did not show significant effusion pericardial however showed right-sided pleural effusion and significant enlargement of the right heart which is likely chronic for the patient given her history. Plan for blood work, CT head and possible CT angina chest for blood clot. Patient improved with oxygen nasal cannula 88% on 4 L.  The patients results and plan were reviewed and discussed.   Any x-rays performed were personally reviewed by myself.   Differential diagnosis were considered with  the presenting HPI.  Medications  sodium chloride 0.9 % injection 3 mL (not administered)  sodium chloride 0.9 % injection 3 mL (not administered)  0.9 %  sodium chloride infusion (not administered)  albuterol (PROVENTIL) (2.5 MG/3ML) 0.083% nebulizer solution 3-6 mL (not administered)  divalproex (DEPAKOTE ER) 24 hr tablet 500 mg (not administered)  levothyroxine (SYNTHROID, LEVOTHROID) tablet 112 mcg (not administered)  bosentan (TRACLEER) tablet 62.5 mg (not administered)  B-complex with vitamin C tablet 1 tablet (not administered)  multivitamin with minerals tablet 1 tablet (not administered)  vitamin C (ASCORBIC ACID) tablet 500 mg (not administered)    Filed Vitals:   07/18/14 1456 07/18/14 1500 07/18/14 1507 07/18/14 1602  BP:  112/83  114/84  Pulse: 78 78  81  Temp:    98.2 F (36.8 C)  TempSrc:    Oral  Resp: 23 22  18   Height:    5\' 1"  (1.549 m)  Weight:    130 lb 11.2 oz (59.285 kg)  SpO2: 92% 90% 89% 95%    Final diagnoses:  Dyspnea  Altered mental status, unspecified altered mental status type    Admission/ observation were discussed with the admitting physician, patient and/or family and they are comfortable with the plan.      Mariea Clonts,  MD 07/18/14 [id number]

## 2014-07-18 NOTE — ED Notes (Signed)
Report given to
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
## 2014-07-18 NOTE — H&P (Addendum)
[facility] History and Physical  [name] IDP:[id number] DOB: [birth year] DOA: 07/18/2014  Referring physician: Dr. Elnora Morrison PCP: Gerrit Heck, MD  Specialists: Dr. Norma Fredrickson, psychiatrist  Chief Complaint: Altered mental status  HPI: Michaela Horton is a 73 y.o. female  With a history of Mary embolism, hypothyroidism, schizoaffective disorder, bipolar disorder, pulmonary hypertension that presented to the emergency department with complaints of confusion. Supposedly patient had presented at the cancer center earlier today she had an appointment however she did not. Patient was then sent to the emergency department for altered mental status. Patient admitted to taking Ativan twice as she was somewhat anxious today. She also uses 4 L of oxygen at home. Upon arrival to the emergency department, patient was noted to have oxygen saturations at 75%. However her tank was noted to be empty. Once patient was placed on nasal cannula, her oxygen saturations did improve as well as her mentation. Patient was able to answer questions appropriately, however did make some strange, nonsensical comments. Emergency department physician, patient was tearful however then laughed. In the emergency department, CT of the head was negative for any acute abnormalities, chest x-ray as well as urinalysis were also negative for infection. TRH was asked to admit for observation.  Review of Systems:  Constitutional: Denies fever, chills, diaphoresis, appetite change and fatigue.  HEENT: Denies photophobia, eye pain, redness, hearing loss, ear pain, congestion, sore throat, rhinorrhea, sneezing, mouth sores, trouble swallowing, neck pain, neck stiffness and tinnitus.   Respiratory: complained of some shortness of breath earlier. Cardiovascular: Denies chest pain, palpitations and leg swelling.  Gastrointestinal: Denies nausea, vomiting, abdominal pain, diarrhea, constipation, blood in stool and  abdominal distention.  Genitourinary: Denies dysuria, urgency, frequency, hematuria, flank pain and difficulty urinating.  Musculoskeletal: Denies myalgias, back pain, joint swelling, arthralgias and gait problem.  Skin: Denies pallor, rash and wound.  Neurological: admits to some confusion earlier. Hematological: Denies adenopathy. Easy bruising, personal or family bleeding history  Psychiatric/Behavioral: Denies suicidal ideation, mood changes, confusion, nervousness, sleep disturbance and agitation  Past Medical History  Diagnosis Date  . Dyspnea   . Pulmonary embolism   . Breast cyst     right  . Hypothyroidism   . Renal cyst   . Leukocytopenia   . Monoclonal gammopathy   . Osteoarthritis   . Schizoaffective disorder   . Wears dentures   . Pulmonary hypertension    Past Surgical History  Procedure Laterality Date  . Tubal ligation  1970  . Tubal ligation    . Breast surgery      cyst   Social History:  reports that she has never smoked. She has never used smokeless tobacco. She reports that she does not drink alcohol or use illicit drugs. Supposedly lives at home alone.  No Known Allergies  Family History  Problem Relation Age of Onset  . Cancer Father     PROSTATE  . Atrial fibrillation Mother   . Alzheimer's disease Mother     Prior to Admission medications   Medication Sig Start Date End Date Taking? Authorizing Provider  albuterol (PROVENTIL HFA;VENTOLIN HFA) 108 (90 BASE) MCG/ACT inhaler Inhale 1-2 puffs into the lungs every 6 (six) hours as needed for wheezing or shortness of breath. 07/08/14  Yes April K Palumbo-Rasch, MD  B Complex-C (B-COMPLEX WITH VITAMIN C) tablet Take 1 tablet by mouth daily.    Yes Historical Provider, MD  bosentan (TRACLEER) 125 MG tablet Take 62.5 mg by mouth 2 (two) times daily.  Yes Historical Provider, MD  cholecalciferol (VITAMIN D) 1000 UNITS tablet Take 1,000 Units by mouth daily.   Yes Historical Provider, MD    Dextromethorphan HBr (ROBITUSSIN MAXIMUM STRENGTH PO) Take 10 mLs by mouth as needed (for cold).    Yes Historical Provider, MD  furosemide (LASIX) 20 MG tablet Take 20 mg by mouth daily.    Yes Historical Provider, MD  levothyroxine (SYNTHROID, LEVOTHROID) 112 MCG tablet Take 112 mcg by mouth daily before breakfast.   Yes Historical Provider, MD  LORazepam (ATIVAN) 1 MG tablet Take 1 tablet by mouth 2 (two) times daily as needed for anxiety or sleep.  10/13/13  Yes Historical Provider, MD  Multiple Vitamin (MULTIVITAMIN WITH MINERALS) TABS tablet Take 1 tablet by mouth every morning.   Yes Historical Provider, MD  temazepam (RESTORIL) 15 MG capsule Take 15 mg by mouth at bedtime.    Yes Historical Provider, MD  vitamin C (ASCORBIC ACID) 500 MG tablet Take 500 mg by mouth daily.    Yes Historical Provider, MD  warfarin (COUMADIN) 5 MG tablet Take 5 mg by mouth daily at 6 PM.   Yes Historical Provider, MD  amoxicillin (AMOXIL) 500 MG capsule Take 1 capsule (500 mg total) by mouth 3 (three) times daily. Patient not taking: Reported on 07/18/2014 07/08/14   April K Palumbo-Rasch, MD  divalproex (DEPAKOTE ER) 500 MG 24 hr tablet Take 500 mg by mouth daily.    Historical Provider, MD  traMADol (ULTRAM) 50 MG tablet Take 1 tablet (50 mg total) by mouth every 6 (six) hours as needed. 06/03/14   Britt Bottom, NP   Physical Exam: Filed Vitals:   07/18/14 1430  BP: 121/80  Pulse:   Temp:   Resp: 24     General: Well developed, well nourished, NAD, appears stated age  HEENT: NCAT, PERRLA, EOMI, Anicteic Sclera, mucous membranes moist.   Neck: Supple, no JVD, no masses  Cardiovascular: S1 S2 auscultated, no rubs, murmurs or gallops. Regular rate and rhythm.  Respiratory: Clear to auscultation bilaterally with equal chest rise  Abdomen: Soft, nontender, nondistended, + bowel sounds  Extremities: warm dry without cyanosis clubbing or edema  Neuro: AAOx3, cranial nerves grossly intact.  Strength 5/5 in patient's upper and lower extremities bilaterally, however does say some strange comments  Skin: Without rashes exudates or nodules  Psych: Normal affect and demeanor with intact judgement and insight  Labs on Admission:  Basic Metabolic Panel:  Recent Labs Lab 07/17/14 1011 07/18/14 1246  NA 140 136*  K 4.0 3.7  CL  --  103  CO2 24 23  GLUCOSE 94 90  BUN 20.4 22  CREATININE 1.2* 1.13*  CALCIUM 9.4 9.6   Liver Function Tests:  Recent Labs Lab 07/17/14 1011  AST 28  ALT 30  ALKPHOS 43  BILITOT 0.76  PROT 8.1  ALBUMIN 3.7   No results for input(s): LIPASE, AMYLASE in the last 168 hours. No results for input(s): AMMONIA in the last 168 hours. CBC:  Recent Labs Lab 07/17/14 1011 07/18/14 1246  WBC 4.2 4.1  NEUTROABS 2.1 1.8  HGB 14.6 14.6  HCT 45.3 43.9  MCV 95.6 95.6  PLT 279 279   Cardiac Enzymes:  Recent Labs Lab 07/18/14 1246  TROPONINI <0.30    BNP (last 3 results)  Recent Labs  10/27/13 1641 07/07/14 2158 07/18/14 1246  PROBNP 848.8* 5315.0* 6961.0*   CBG: No results for input(s): GLUCAP in the last 168 hours.  Radiological Exams on Admission:  Ct Head Wo Contrast  07/18/2014   CLINICAL DATA:  Altered mental status, bilateral foot pain/numbness  EXAM: CT HEAD WITHOUT CONTRAST  TECHNIQUE: Contiguous axial images were obtained from the base of the skull through the vertex without intravenous contrast.  COMPARISON:  MRI brain dated 10/16/2013  FINDINGS: No evidence of parenchymal hemorrhage or extra-axial fluid collection. No mass lesion, mass effect, or midline shift.  No CT evidence of acute infarction.  Subcortical white matter and periventricular small vessel ischemic changes. Mild intracranial atherosclerosis.  Age related volume loss.  No ventriculomegaly.  The visualized paranasal sinuses are essentially clear. The mastoid air cells are unopacified.  No evidence of calvarial fracture.  IMPRESSION: No evidence of acute  intracranial abnormality.  Mild small vessel ischemic changes.   Electronically Signed   By: Julian Hy M.D.   On: 07/18/2014 13:46   Dg Chest Port 1 View  07/18/2014   CLINICAL DATA:  Dyspnea, patient fell fell asleep on the way to the hospital  EXAM: PORTABLE CHEST - 1 VIEW  COMPARISON:  CT chest 07/08/2014  FINDINGS: There is elevation of the left diaphragm. There is left basilar atelectasis. There is no focal parenchymal opacity, pleural effusion, or pneumothorax. The heart and mediastinal contours are unremarkable.  The osseous structures are unremarkable.  IMPRESSION: No active disease.   Electronically Signed   By: Kathreen Devoid   On: 07/18/2014 13:16    EKG: Independently reviewed. Sinus rhythm, rate 86  Assessment/Plan  Acute encephalopathy -Patient will be admitted for observation -Likely multifactorial including double dose of Ativan versus polypharmacy versus hypoxia -Patient currently is awake and alert and oriented and able to answer questions and have conversation however does have some strange comments. -Attempted to reach patient's daughters as well as psychiatrist to obtain patient's baseline status, however pending call back. -Will continue to monitor patient and neuro checks. -CT head negative for acute intracranial abnormality. -UA and CXR negative for infection -will hold Restoril,  Ativan, tramadol  Acute on chronic hypoxic respiratory failure -Patient normally uses 4 L of oxygen at home -Upon arrival to the emergency department, patient's oxygen saturation was noted to be 75% -her oxygen tank was also found to be empty -Once patient was placed on nasal cannula, her mentation did improve as well as her hypoxia  -will consult case management for home oxygen needs  Chronic pulmonary hypertension -continue bosentan and lasix  Hypothyroidism -Continue Synthroid  History of pulmonary embolism -Continue Coumadin, pharmacy to dose  Bipolar/schizoaffective  disorder -Attempted to reach patient's psychiatrist as well as the [facility]&H for any information regarding patient's history or baseline, however unable to reach anyone. -Continue to monitor closely -Continue Depakote -Will obtain depakote level, on 07/07/2014 10.8  Chronic kidney disease, stage III -Creatinine appears to be at baseline.   DVT prophylaxis: Coumadin  Code Status: Full  Condition: Guarded  Family Communication: none at bedside.  Attempted to contact daughters and patient's psychiatrist, however, was unable to speak with someone.    Disposition Plan: Admitted for observation  Time spent: 65 minutes  Ossiel Marchio D.O. [facility] Pager [phone]  If 7PM-7AM, please contact night-coverage [email or web address] Password [email or web address] 07/18/2014, 2:57 PM

## 2014-07-18 NOTE — Progress Notes (Signed)
Spoke with pt's daughter, Asencion Gowda, that states she is the POA. She states that pt was just recently d/c from [place] facility and that they were able to help in regulating her medication. However since her discharge from there the pt has not been taking her medications correctly, "fired her doctor", and the family doesn't know what else to do with her. SW consult entered into the pt's chart to help with discharge planning.

## 2014-07-18 NOTE — BH Assessment (Addendum)
Assessment Note  Michaela Horton is an 73 y.o. female with hx of Schizoaffective disorder and Adjustment disorder. Per ED notes patient presenting to [facility] for a drug overdose. She presents from the cancer center with confusion and per ED notes hypoxia. Furthermore, patient went to an appointment that she thought she had however she did not have a scheduled appointment. This the second time she has done this in a short period of time. Patient says she took 2 doses of her Ativan prior to going the cancer center because she wanted a stronger effect for anxiety prior to her appointment. Per ED notes, patient arrived in the ER and auction tank was empty.  EMS reports that the patient's affect and behavior is bizarre.The daughter reported to ED staff this behavior(singing and occasionally yelling out) is the pt's baseline.   Writer met with patient at bedside to complete a TTS assessment. Patient denies SI, HI, and AVH's. Patient acknowledges that she is diagnosed with Schizoaffective Disorder. However sts that she had a psychotic episode yrs ago and this diagnoses has followed her throughout her life. Patient is cooperative, alert, and oriented to place, person, and situation. She reports that her daughter is POA. Patient does not feel as if she has a good support system at this time. Patient reports that she has been hospitalized 20x's in the past for mental health reasons. She is unable to provide information to explain why she was previously hospitalized. Per previous ED notes patient was sent to [facility] in the past for psychotic behaviors.   Patient admits to alcohol use. Sts that she drinks wine coolers. Patient does not provide details of use but admits that she does drink when taking her medications.   Patient's psychiatrist is Dr. Marchelle Gearing.    Axis I: Schizoaffective Disorder  Axis II: Deferred Axis III:  Past Medical History  Diagnosis Date  . Dyspnea   . Pulmonary embolism    . Breast cyst     right  . Hypothyroidism   . Renal cyst   . Leukocytopenia   . Monoclonal gammopathy   . Osteoarthritis   . Schizoaffective disorder   . Wears dentures   . Pulmonary hypertension    Axis IV: other psychosocial or environmental problems, problems related to social environment, problems with access to health care services and problems with primary support group Axis V: 31-40 impairment in reality testing  Past Medical History:  Past Medical History  Diagnosis Date  . Dyspnea   . Pulmonary embolism   . Breast cyst     right  . Hypothyroidism   . Renal cyst   . Leukocytopenia   . Monoclonal gammopathy   . Osteoarthritis   . Schizoaffective disorder   . Wears dentures   . Pulmonary hypertension     Past Surgical History  Procedure Laterality Date  . Tubal ligation  1970  . Tubal ligation    . Breast surgery      cyst    Family History:  Family History  Problem Relation Age of Onset  . Cancer Father     PROSTATE  . Atrial fibrillation Mother   . Alzheimer's disease Mother     Social History:  reports that she has never smoked. She has never used smokeless tobacco. She reports that she drinks alcohol. She reports that she does not use illicit drugs.  Additional Social History:  Alcohol / Drug Use Pain Medications: SEE MAR Prescriptions: SEE MAR Over the Counter: SEE MAR  History of alcohol / drug use?: Yes Substance #1 Name of Substance 1: Alcohol -"Wine Coolers" 1 - Age of First Use: unk 1 - Amount (size/oz): varies  1 - Frequency: varies; pt did not provide details  1 - Duration: on-going  1 - Last Use / Amount: 07/18/2014  CIWA: CIWA-Ar BP: 114/84 mmHg Pulse Rate: 81 COWS:    Allergies: No Known Allergies  Home Medications:  Medications Prior to Admission  Medication Sig Dispense Refill  . albuterol (PROVENTIL HFA;VENTOLIN HFA) 108 (90 BASE) MCG/ACT inhaler Inhale 1-2 puffs into the lungs every 6 (six) hours as needed for wheezing  or shortness of breath. 1 Inhaler 0  . B Complex-C (B-COMPLEX WITH VITAMIN C) tablet Take 1 tablet by mouth daily.     Marland Kitchen bosentan (TRACLEER) 125 MG tablet Take 62.5 mg by mouth 2 (two) times daily.     . cholecalciferol (VITAMIN D) 1000 UNITS tablet Take 1,000 Units by mouth daily.    Marland Kitchen Dextromethorphan HBr (ROBITUSSIN MAXIMUM STRENGTH PO) Take 10 mLs by mouth as needed (for cold).     . furosemide (LASIX) 20 MG tablet Take 20 mg by mouth daily.     Marland Kitchen levothyroxine (SYNTHROID, LEVOTHROID) 112 MCG tablet Take 112 mcg by mouth daily before breakfast.    . LORazepam (ATIVAN) 1 MG tablet Take 1 tablet by mouth 2 (two) times daily as needed for anxiety or sleep.     . Multiple Vitamin (MULTIVITAMIN WITH MINERALS) TABS tablet Take 1 tablet by mouth every morning.    . temazepam (RESTORIL) 15 MG capsule Take 15 mg by mouth at bedtime.     . vitamin C (ASCORBIC ACID) 500 MG tablet Take 500 mg by mouth daily.     Marland Kitchen warfarin (COUMADIN) 5 MG tablet Take 5 mg by mouth daily at 6 PM.    . amoxicillin (AMOXIL) 500 MG capsule Take 1 capsule (500 mg total) by mouth 3 (three) times daily. (Patient not taking: Reported on 07/18/2014) 21 capsule 0  . divalproex (DEPAKOTE ER) 500 MG 24 hr tablet Take 500 mg by mouth daily.    . traMADol (ULTRAM) 50 MG tablet Take 1 tablet (50 mg total) by mouth every 6 (six) hours as needed. 15 tablet 0    OB/GYN Status:  No LMP recorded. Patient is postmenopausal.  General Assessment Data Location of Assessment: [facility] ED Is this a Tele or Face-to-Face Assessment?: Face-to-Face Is this an Initial Assessment or a Re-assessment for this encounter?: Initial Assessment Living Arrangements: Alone Can pt return to current living arrangement?: Yes Admission Status: Voluntary Is patient capable of signing voluntary admission?: Yes Transfer from: Acute Hospital Referral Source: Self/Family/Friend     [facility] Crisis Care Plan Living Arrangements: Alone Name of Psychiatrist:  (No  psychiatrist ) Name of Therapist:  (No therapist )  Education Status Is patient currently in school?: No  Risk to self with the past 6 months Suicidal Ideation: No Suicidal Intent: No Is patient at risk for suicide?: No Suicidal Plan?: No Access to Means: No What has been your use of drugs/alcohol within the last 12 months?:  (n/a) Previous Attempts/Gestures: No How many times?:  (0) Other Self Harm Risks:  (none reported ) Triggers for Past Attempts: Other (Comment) Intentional Self Injurious Behavior: None (None reported ) Family Suicide History: Yes (Pt sts that daughter has a Bipolar Dx's) Recent stressful life event(s): Other (Comment) (relational conflict with children and lonliness) Persecutory voices/beliefs?: No Depression: Yes Depression Symptoms: Feeling worthless/self pity,  Feeling angry/irritable, Loss of interest in usual pleasures, Guilt, Fatigue, Isolating, Tearfulness, Insomnia, Despondent Substance abuse history and/or treatment for substance abuse?: No Suicide prevention information given to non-admitted patients: Not applicable  Risk to Others within the past 6 months Homicidal Ideation: No Thoughts of Harm to Others: No Current Homicidal Intent: No Current Homicidal Plan: No Access to Homicidal Means: No Identified Victim:  (n/a) History of harm to others?: No Assessment of Violence: None Noted Violent Behavior Description:  (patient is calm and cooperative ) Does patient have access to weapons?: No Criminal Charges Pending?: No Does patient have a court date: No  Psychosis Hallucinations: None noted Delusions: None noted     Cognitive Functioning Concentration: Decreased Memory: Recent Intact, Remote Intact IQ: Average Insight: Good Impulse Control: Fair Appetite: Fair Weight Loss:  (none reported ) Weight Gain:  (none reported ) Sleep: No Change Total Hours of Sleep:  (n/a) Vegetative Symptoms: Decreased grooming, Not bathing, Staying in  bed  ADLScreening [facility] Assessment Services) Patient's cognitive ability adequate to safely complete daily activities?: No Patient able to express need for assistance with ADLs?: Yes Independently performs ADLs?: Yes (appropriate for developmental age)  Prior Inpatient Therapy Prior Inpatient Therapy: Yes Prior Therapy Dates:  (pt reports 20 prior admissions; unable to recall dates) Prior Therapy Facilty/Provider(s):  (pt unable to recall) Reason for Treatment:  (Schizoaffective Disorder)  Prior Outpatient Therapy Prior Outpatient Therapy: Yes Prior Therapy Dates:  (current ) Prior Therapy Facilty/Provider(s):  (Dr. Kandis Fantasia [phone]) Reason for Treatment:  (n/a)  ADL Screening (condition at time of admission) Patient's cognitive ability adequate to safely complete daily activities?: No Is the patient deaf or have difficulty hearing?: No Does the patient have difficulty seeing, even when wearing glasses/contacts?: No Does the patient have difficulty concentrating, remembering, or making decisions?: No (questionable ) Patient able to express need for assistance with ADLs?: Yes Does the patient have difficulty dressing or bathing?: No Independently performs ADLs?: Yes (appropriate for developmental age) Does the patient have difficulty walking or climbing stairs?: No Weakness of Legs: None Weakness of Arms/Hands: None  Home Assistive Devices/Equipment Home Assistive Devices/Equipment: None  Therapy Consults (therapy consults require a physician order) PT Evaluation Needed: No OT Evalulation Needed: No SLP Evaluation Needed: No Abuse/Neglect Assessment (Assessment to be complete while patient is alone) Physical Abuse: Denies Verbal Abuse: Denies Sexual Abuse: Denies Exploitation of patient/patient's resources: Denies Self-Neglect: Denies Values / Beliefs Cultural Requests During Hospitalization: None Spiritual Requests During Hospitalization:  None Consults Spiritual Care Consult Needed: No Social Work Consult Needed: No Merchant navy officer (For Healthcare) Does patient have an advance directive?: No Would patient like information on creating an advanced directive?: No - patient declined information Type of Advance Directive: Healthcare Power of Attorney (Pt sts that her daughter is POA Careers information officer)) Does patient want to make changes to advanced directive?: No - Patient declined Copy of advanced directive(s) in chart?: No - copy requested Nutrition Screen- [facility] Patient's home diet: Regular Have you recently lost weight without trying?: Yes If yes, how much weight have you lost?: Patient is unsure ("a lot") Have you been eating poorly because of a decreased appetite?: Yes Malnutrition Screening Tool Score: 3  Additional Information 1:1 In Past 12 Months?: No CIRT Risk: No Elopement Risk: No Does patient have medical clearance?: Yes     Disposition:  Disposition Initial Assessment Completed for this Encounter: Yes Disposition of Patient: Other dispositions (Medical admit) Other disposition(s):  (Per EDP-Dr. Danae Chen patient admitted to the medical  floor)  On Site Evaluation by:   Reviewed with Physician:    Waldon [facility] 07/18/2014 4:07 PM

## 2014-07-18 NOTE — Progress Notes (Signed)
Clinical Social Work Department BRIEF PSYCHOSOCIAL ASSESSMENT 07/18/2014   Patient:  Michaela Horton, Michaela Horton     Account Number:  [id number]     Admit date:  07/18/2014  Clinical Social Worker:  Tilda Burrow, CLINICAL SOCIAL WORKER  Date/Time:  07/18/2014 03:46 PM  Referred by:    Date Referred:  07/18/2014 Referred for  Behavioral Health Issues  Psychosocial assessment   Other Referral:   Interview type:   Other interview type:    PSYCHOSOCIAL DATA Living Status:  ALONE Admitted from facility:   Level of care:   Primary support name:  None Primary support relationship to patient:  NONE Degree of support available:    CURRENT CONCERNS Current Concerns  Behavioral Health Issues  Post-Acute Placement   Other Concerns:    SOCIAL WORK ASSESSMENT / PLAN CSW spoke with Pt at bedside. There was no family at bedside. Pt informed CSW that she stays alone, and does not feels she has any support from family. However, she mentioned her daughter friend is a great support. Pt states she does have a POA, but may be changing it soon.when asked about medicine compliance; The Pt informed CSW that she does not like taking a lot of prescriptions and she thrown out 2 of her prescriptions.  The pt states she is compliant with doctor appointments. Once discharged the Pt wishes to return home.   Assessment/plan status:  Psychosocial Support/Ongoing Assessment of Needs Other assessment/ plan:   Information/referral to community resources:   Unidenified    PATIENT'S/FAMILY'S RESPONSE TO PLAN OF CARE: Pt appears to be actively psychotic. CSW will continue to access Pt needs.    Willette Brace [phone] ED CSW 07/18/2014 4:00 PM
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
## 2014-07-18 NOTE — ED Notes (Signed)
Pt refused to be catherized. Pt stated "that is something they do to slaves". Pt is agreed to attempt to urinate in bedpan.

## 2014-07-18 NOTE — ED Notes (Signed)
MD at bedside with this RN 

## 2014-07-19 DIAGNOSIS — I5032 Chronic diastolic (congestive) heart failure: Secondary | ICD-10-CM

## 2014-07-19 DIAGNOSIS — E038 Other specified hypothyroidism: Secondary | ICD-10-CM

## 2014-07-19 LAB — BASIC METABOLIC PANEL
ANION GAP: 9 (ref 5–15)
BUN: 18 mg/dL (ref 6–23)
CALCIUM: 8.9 mg/dL (ref 8.4–10.5)
CO2: 23 mEq/L (ref 19–32)
CREATININE: 1.03 mg/dL (ref 0.50–1.10)
Chloride: 108 mEq/L (ref 96–112)
GFR calc Af Amer: 62 mL/min — ABNORMAL LOW (ref >90)
GFR calc non Af Amer: 53 mL/min — ABNORMAL LOW (ref >90)
Glucose, Bld: 93 mg/dL (ref 70–99)
Potassium: 4 mEq/L (ref 3.7–5.3)
Sodium: 140 mEq/L (ref 137–147)

## 2014-07-19 LAB — CBC
HCT: 39.5 % (ref 36.0–46.0)
Hemoglobin: 12.9 g/dL (ref 12.0–15.0)
MCH: 30.6 pg (ref 26.0–34.0)
MCHC: 32.7 g/dL (ref 30.0–36.0)
MCV: 93.6 fL (ref 78.0–100.0)
Platelets: 267 10*3/uL (ref 150–400)
RBC: 4.22 MIL/uL (ref 3.87–5.11)
RDW: 15.1 % (ref 11.5–15.5)
WBC: 3.6 10*3/uL — ABNORMAL LOW (ref 4.0–10.5)

## 2014-07-19 LAB — PROTIME-INR
INR: 2.4 — AB (ref 0.00–1.49)
PROTHROMBIN TIME: 26.3 s — AB (ref 11.6–15.2)

## 2014-07-19 LAB — TSH: TSH: 1.1 u[IU]/mL (ref 0.350–4.500)

## 2014-07-19 MED ORDER — WARFARIN SODIUM 5 MG PO TABS
5.0000 mg | ORAL_TABLET | Freq: Once | ORAL | Status: DC
Start: 2014-07-19 — End: 2014-07-20
  Filled 2014-07-19: qty 1

## 2014-07-19 MED ORDER — SPIRONOLACTONE 50 MG PO TABS
50.0000 mg | ORAL_TABLET | Freq: Every day | ORAL | Status: DC
  Administered 2014-07-21 – 2014-07-23 (×3): 50 mg via ORAL
  Filled 2014-07-19 (×5): qty 1

## 2014-07-19 MED ORDER — FUROSEMIDE 10 MG/ML IJ SOLN
20.0000 mg | Freq: Every day | INTRAMUSCULAR | Status: DC
  Filled 2014-07-19 (×5): qty 2

## 2014-07-19 MED ORDER — LORAZEPAM 2 MG/ML IJ SOLN
0.5000 mg | Freq: Three times a day (TID) | INTRAMUSCULAR | Status: DC | PRN
  Administered 2014-07-20 (×2): 0.5 mg via INTRAVENOUS
  Filled 2014-07-19 (×3): qty 1

## 2014-07-19 MED ORDER — BOOST PLUS PO LIQD
237.0000 mL | Freq: Two times a day (BID) | ORAL | Status: DC
  Administered 2014-07-19 – 2014-07-23 (×6): 237 mL via ORAL
  Filled 2014-07-19 (×9): qty 237

## 2014-07-19 MED ORDER — DIVALPROEX SODIUM ER 500 MG PO TB24
500.0000 mg | ORAL_TABLET | Freq: Two times a day (BID) | ORAL | Status: DC
  Filled 2014-07-19 (×3): qty 1

## 2014-07-19 MED ORDER — WARFARIN - PHARMACIST DOSING INPATIENT: Freq: Every day | Status: DC

## 2014-07-19 MED ORDER — ACETAMINOPHEN 325 MG PO TABS
650.0000 mg | ORAL_TABLET | Freq: Four times a day (QID) | ORAL | Status: DC | PRN
  Administered 2014-07-20 – 2014-07-22 (×2): 650 mg via ORAL
  Filled 2014-07-19 (×2): qty 2

## 2014-07-19 MED ORDER — OLANZAPINE 5 MG PO TBDP
5.0000 mg | ORAL_TABLET | Freq: Every day | ORAL | Status: DC
  Administered 2014-07-20 – 2014-07-22 (×2): 5 mg via ORAL
  Filled 2014-07-19 (×6): qty 1

## 2014-07-19 NOTE — Progress Notes (Addendum)
PT Cancellation Note  Patient Details Name: Haizley Cannella MRN: [id number] DOB: 12/18/1940   Cancelled Treatment:    Reason Eval/Treat Not Completed: Patient declined, no reason specified. Will check back another day. If pt continues to refuse, will have to sign off. Observed pt walking from bathroom back to bed with nursing supervision and without assistive device.    Weston Anna, MPT Pager: [phone]

## 2014-07-19 NOTE — Progress Notes (Signed)
Patient has continued to refuse her medications today. She only took synthroid this AM, after that she stated "I'm not taking that s**t". She continues to be confused at times and talking loud & to people that are not here and swearing. Will continue to monitor.

## 2014-07-19 NOTE — Progress Notes (Addendum)
Patient ID: Michaela Horton, female   DOB: Sep 16, 1940, 73 y.o.   MRN: [id number] [facility] PROGRESS NOTE  Michaela Horton IEP:[id number] DOB: 1940/10/13 DOA: 07/18/2014 PCP: Gerrit Heck, MD  Brief narrative:    73 -year-old female with history of pulmonary embolism, on anticoagulation with Coumadin, schizoaffective disorder, bipolar disorder, hypothyroidism, history of pulmonary hypertension, on oxygen at home however questionable compliance not only with oxygen but also medications. Patient presented to [facility] ED with altered mental status, confusion. [facility] She was subsequently sent to emergency department for evaluation of altered mental status. Patient is not a good historian because of her mental status. She claimed on the admission that she to Ativan more than the prescribed dose because she felt anxious. On admission, patient was hemodynamically stable. Her blood work was notable for slight elevation in creatinine, 1.2 otherwise unremarkable. INR was at therapeutic level, 2.29. Chest x-ray showed no active disease. CT head showed no acute intracranial abnormalities. She was admitted for further evaluation of altered mental status. Psychiatry will see the patient in consultation.  Assessment/Plan:    Principal Problem:   Acute encephalopathy - Unclear etiology. CT head on the admission showed no acute intracranial abnormalities. - Appreciate psychiatry consult and their recommendations. - Order placed for physical therapy evaluation. - Patient is disoriented, incoherent speech. Continue to monitor her mental status. May use low-dose Ativan as needed for agitation. Active Problems:   Chronic diastolic congestive heart failure / Acute respiratory failure with hypoxia / history of pulmonary hypertension - No complaints of shortness of breath or chest pain. BNP however elevated at  6900 on the admission. Most recent BNP (07/07/2014) in 5000 range.  - Last 2-D echo on file in February 2015 with preserved ejection fraction.  - Clinically she looks compensated. Chest x-ray on admission showed no active disease. - We will resume spironolactone 50 mg daily. At home, patient is on Lasix 20 mg daily and we'll change this to 20 mg IV daily. - Cardiology consulted - phone call only. I spoke with Dr. Mare Ferrari and he said no need to repeat 2 D ECHO; continue diuresis and spironolactone. If decompensates to call them back. - For pulmonary hypertension she is on bosentan 62.5 mg twice daily   Hypothyroidism - Check TSH level. Current Synthroid dose is 112 g daily    Chronic thromboembolic pulmonary hypertension - Coumadin per pharmacy. INR is at therapeutic range.    Bipolar/schizoaffective disorder - Continue Depakote 500 mg daily. Depakote level on admission within the normal range.    Chronic kidney disease, stage III - Creatinine appears to be at baseline. 5 months prior to this admission creatinine was 1.5 and on this admission 1.2.   DVT Prophylaxis  - On therapeutic anticoagulation with Coumadin.  Code Status: Full.  Family Communication:  Family not at the bedside this morning. Disposition Plan: Remains inpatient, needs physical therapy evaluation.  IV access:   Peripheral IV  Procedures and diagnostic studies:    Ct Head Wo Contrast 07/18/2014   No evidence of acute intracranial abnormality.  Mild small vessel ischemic changes.    Dg Chest Port 1 View 07/18/2014   No active disease.     Medical Consultants:   Psychiatry  Cardiology - phone call only   Other Consultants:   Physical therapy   IAnti-Infectives:    None    Leisa Lenz, MD  [facility] Pager [phone]  If  7PM-7AM, please contact night-coverage [email or web address] Password [facility] 07/19/2014, 9:34 AM   LOS: 1 day    HPI/Subjective: No acute overnight events.  Objective: Filed  Vitals:   07/18/14 1507 07/18/14 1602 07/18/14 2157 07/19/14 0739  BP:  114/84 114/80 130/86  Pulse:  81 80 80  Temp:  98.2 F (36.8 C) 98.7 F (37.1 C) 98.6 F (37 C)  TempSrc:  Oral Oral Oral  Resp:  18 20 20   Height:  5\' 1"  (1.549 m)    Weight:  59.285 kg (130 lb 11.2 oz)    SpO2: 89% 95% 91% 88%    Intake/Output Summary (Last 24 hours) at 07/19/14 0934 Last data filed at 07/19/14 0400  Gross per 24 hour  Intake    600 ml  Output      0 ml  Net    600 ml    Exam:   General:  Pt is alert, disoriented, incoherent speech   Cardiovascular: Regular rate and rhythm, S1/S2 appreciated   Respiratory: Clear to auscultation bilaterally, no wheezing, no crackles, no rhonchi  Abdomen: Soft, non tender, non distended, bowel sounds present  Extremities: No edema, pulses DP and PT palpable bilaterally  Neuro: No focal neurological deficits  Data Reviewed: Basic Metabolic Panel:  Recent Labs Lab 07/17/14 1011 07/18/14 1246 07/19/14 0445  NA 140 136* 140  K 4.0 3.7 4.0  CL  --  103 108  CO2 24 23 23   GLUCOSE 94 90 93  BUN 20.4 22 18   CREATININE 1.2* 1.13* 1.03  CALCIUM 9.4 9.6 8.9   Liver Function Tests:  Recent Labs Lab 07/17/14 1011  AST 28  ALT 30  ALKPHOS 43  BILITOT 0.76  PROT 8.1  ALBUMIN 3.7   No results for input(s): LIPASE, AMYLASE in the last 168 hours. No results for input(s): AMMONIA in the last 168 hours. CBC:  Recent Labs Lab 07/17/14 1011 07/18/14 1246 07/19/14 0445  WBC 4.2 4.1 3.6*  NEUTROABS 2.1 1.8  --   HGB 14.6 14.6 12.9  HCT 45.3 43.9 39.5  MCV 95.6 95.6 93.6  PLT 279 279 267   Cardiac Enzymes:  Recent Labs Lab 07/18/14 1246  TROPONINI <0.30   BNP: Invalid input(s): POCBNP CBG: No results for input(s): GLUCAP in the last 168 hours.  Urine Culture     Status: None   Collection Time: 07/17/14 10:11 AM  Result Value Ref Range Status   Urine Culture, Routine Culture, Urine  Final    Comment: Final - ===== COLONY  COUNT: ===== NO GROWTH     Scheduled Meds: . B-complex with vitamin  1 tablet Oral Daily  . bosentan  62.5 mg Oral BID  . divalproex  500 mg Oral Daily  . levothyroxine  112 mcg Oral QAC breakfast  . multivitamin   1 tablet Oral q morning - 10a  . vitamin C  500 mg Oral Daily  . warfarin  5 mg Oral ONCE-1800

## 2014-07-19 NOTE — Progress Notes (Signed)
UR completed 

## 2014-07-19 NOTE — Progress Notes (Signed)
Nutrition Brief Note  Patient identified on the Malnutrition Screening Tool (MST) Report  Wt Readings from Last 15 Encounters:  07/18/14 130 lb 11.2 oz (59.285 kg)  06/03/14 135 lb (61.236 kg)  04/17/14 136 lb (61.689 kg)  04/07/14 136 lb (61.689 kg)  03/23/14 140 lb (63.504 kg)  01/12/14 152 lb (68.947 kg)  10/19/13 157 lb 3 oz (71.3 kg)  10/05/13 155 lb (70.308 kg)  10/02/13 156 lb (70.761 kg)  07/31/13 155 lb (70.308 kg)  07/13/13 154 lb 11.2 oz (70.171 kg)  05/20/13 161 lb (73.029 kg)  05/18/13 161 lb 6.4 oz (73.211 kg)  05/06/13 164 lb (74.39 kg)  04/29/13 143 lb (64.864 kg)    Body mass index is 24.71 kg/(m^2). Patient meets criteria for Normal weight based on current BMI.   Current diet order is Regular, patient is consuming approximately 50% of meals at this time. Labs and medications reviewed.   Pt reported consuming three meals daily with snacks and high protein supplement intake. Reported weight stable around 130-135 lbs, and has been trying to gain weight to 140 lbs. Requested protein supplement that contains >25 gram protein/serving; however was agreeable to consume Boost Plus. Pt became very agitated with RD, and did not provide further information.  No nutrition interventions warranted at this time. If nutrition issues arise, please consult RD.   Michaela Abide MS RD LDN Clinical Dietitian CRFVO:[phone]

## 2014-07-19 NOTE — Progress Notes (Signed)
[place] for Warfarin Indication: History of PE  No Known Allergies  Patient Measurements: Height: 5\' 1"  (154.9 cm) Weight: 130 lb 11.2 oz (59.285 kg) IBW/kg (Calculated) : 47.8  Vital Signs: Temp: 98.6 F (37 C) (11/19 0739) Temp Source: Oral (11/19 0739) BP: 130/86 mmHg (11/19 0739) Pulse Rate: 80 (11/19 0739)  Labs:  Recent Labs  07/17/14 1011 07/17/14 1011 07/18/14 1246 07/18/14 1248 07/19/14 0445  HGB 14.6  --  14.6  --  12.9  HCT 45.3  --  43.9  --  39.5  PLT 279  --  279  --  267  LABPROT  --   --   --  25.4* 26.3*  INR  --   --   --  2.29* 2.40*  CREATININE  --  1.2* 1.13*  --  1.03  TROPONINI  --   --  <0.30  --   --     Estimated Creatinine Clearance: 41.4 mL/min (by C-G formula based on Cr of 1.03).   Medical History: Past Medical History  Diagnosis Date  . Dyspnea   . Pulmonary embolism   . Breast cyst     right  . Hypothyroidism   . Renal cyst   . Leukocytopenia   . Monoclonal gammopathy   . Osteoarthritis   . Schizoaffective disorder   . Wears dentures   . Pulmonary hypertension     Medications:  Scheduled:  . B-complex with vitamin C  1 tablet Oral Daily  . bosentan  62.5 mg Oral BID  . divalproex  500 mg Oral Daily  . levothyroxine  112 mcg Oral QAC breakfast  . multivitamin with minerals  1 tablet Oral q morning - 10a  . sodium chloride  3 mL Intravenous Q12H  . vitamin C  500 mg Oral Daily   Infusions:   PRN: sodium chloride, albuterol, sodium chloride  Assessment: 73 yo female with pulmonary hypertension and bipolar disorder presents 11/18 with acute encephalopathy. On chronic warfarin for history of PE.   Dose reported as 5mg  daily - last dose 11/18 AM prior to admit  INR today remains in therapeutic range at 2.40  CBC wnl  No bleeding currently reported  No drug interactions present  Heart healthy diet ordered - patient eating 75%  Noted patient refused all medications last  night  Goal of Therapy:  INR 2-3   Plan:   Warfarin 5mg  PO x 1 tonight  Daily PT/INR  Thank you for the consult.  Currie Paris, PharmD, BCPS Pager: [phone] Pharmacy: [phone] 07/19/2014 8:21 AM
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
## 2014-07-19 NOTE — Progress Notes (Signed)
PT Cancellation Note  Patient Details Name: Michaela Horton MRN: [id number] DOB: 05/10/1941   Cancelled Treatment:    Reason Eval/Treat Not Completed: Attempted PT eval-pt declined to participate at this time. Will check back as schedule permits. Thanks.    Weston Anna, MPT Pager: [phone]

## 2014-07-19 NOTE — Progress Notes (Signed)
Clinical Social Work Department CLINICAL SOCIAL WORK PSYCHIATRY SERVICE LINE ASSESSMENT 07/19/2014  Patient:  Michaela Horton  Account:  [id number]  Admit Date:  07/18/2014  Clinical Social Worker:  Sindy Messing, LCSW  Date/Time:  07/19/2014 11:15 AM Referred by:  Physician  Date referred:  07/19/2014 Reason for Referral  Psychosocial assessment   Presenting Symptoms/Problems (In the person's/family's own words):   Psych consulted for capacity and psychosis.   Abuse/Neglect/Trauma History (check all that apply)  Denies history   Abuse/Neglect/Trauma Comments:   Psychiatric History (check all that apply)  Outpatient treatment  Inpatient/hospitilization   Psychiatric medications:  Depakote 500 mg   Current Mental Health Hospitalizations/Previous Mental Health History:   Patient reports she was diagnosed with schizoaffective disorder about 20 years ago but patient reports she does not feel this is an accurate diagnosis. Patient does report she has been feeling depressed lately.   Current provider:   Patient reports no current outpatient follow up   Place and Date:   N/A   Current Medications:   Scheduled Meds:      . B-complex with vitamin C  1 tablet Oral Daily  . bosentan  62.5 mg Oral BID  . divalproex  500 mg Oral Daily  . levothyroxine  112 mcg Oral QAC breakfast  . multivitamin with minerals  1 tablet Oral q morning - 10a  . vitamin C  500 mg Oral Daily  . warfarin  5 mg Oral ONCE-1800  . Warfarin - Pharmacist Dosing Inpatient   Does not apply q1800        Continuous Infusions:      PRN Meds:.acetaminophen, albuterol, LORazepam       Previous Impatient Admission/Date/Reason:   Per chart review, patient was recently DC from [facility].   Emotional Health / Current Symptoms    Suicide/Self Harm  None reported   Suicide attempt in the past:   Patient denies any SI or HI.   Other harmful behavior:   None reported   Psychotic/Dissociative Symptoms  None  reported   Other Psychotic/Dissociative Symptoms:   Patient denies any psychotic symptoms.    Attention/Behavioral Symptoms  Inattentive   Other Attention / Behavioral Symptoms:   Patient easily distracted and has to be redirected often during assessment.    Cognitive Impairment  Orientation - Self   Other Cognitive Impairment:   Patient alert to self.    Mood and Adjustment  Labile    Stress, Anxiety, Trauma, Any Recent Loss/Stressor  None reported   Anxiety (frequency):   N/A   Phobia (specify):   N/A   Compulsive behavior (specify):   N/A   Obsessive behavior (specify):   N/A   Other:   N/A   Substance Abuse/Use  None   SBIRT completed (please refer for detailed history):  N  Self-reported substance use:   Patient denies any substance use.   Urinary Drug Screen Completed:  Y Alcohol level:   <11    Environmental/Housing/Living Arrangement  Stable housing   Who is in the home:   Alone   Emergency contact:  Cheryl-dtr   Financial  Medicare   Patient's Strengths and Goals (patient's own words):   Patient reports she has supportive family.   Clinical Social Worker's Interpretive Summary:   CSW received referral in order to complete psychosocial assessment. CSW reviewed chart and met with patient at bedside. CSW introduced myself and explained role.    Patient reports she lives at home alone and has 4 children. Patient reports  2 children live nearby and are supportive. Patient reports she was divorced in the 76s but still feels depressed about this situation. Patient gives tangential information about her parent's marriage, her childhood, and relationship with ex-husband when discussing her home life. Patient reports she came to the hospital because she had an appointment at the [facility] but is unable to provide any further detail on why she was admitted.    Patient admits to being diagnosed with schizoaffective disorder in the past.  Patient reports she has taken medication off and on over the years but is not currently taking any medications. Patient reports she fired her doctor because she does not trust doctors and they only do harm. CSW inquired about hallucinations and treatment but patient became distracted when family member entered the room.    Family member Seth Bake) came into room and patient reports it is her cousin. Patient began crying loudly and screaming when cousin arrived but reported she was happy to see her. Patient distracted and unable to be redirected once family arrived. Patient agreeable for CSW to contact dtr [name]) so CSW left messages with CSW contact information and will await return call.    CSW will follow up and staff case with psych MD   Disposition:  Recommend Psych CSW continuing to support while in hospital   [place],  [phone]

## 2014-07-20 DIAGNOSIS — Z820 Family history of epilepsy and other diseases of the nervous system: Secondary | ICD-10-CM | POA: Diagnosis not present

## 2014-07-20 DIAGNOSIS — Z9981 Dependence on supplemental oxygen: Secondary | ICD-10-CM | POA: Diagnosis not present

## 2014-07-20 DIAGNOSIS — G9340 Encephalopathy, unspecified: Secondary | ICD-10-CM | POA: Diagnosis present

## 2014-07-20 DIAGNOSIS — F312 Bipolar disorder, current episode manic severe with psychotic features: Secondary | ICD-10-CM

## 2014-07-20 DIAGNOSIS — R4182 Altered mental status, unspecified: Secondary | ICD-10-CM | POA: Diagnosis present

## 2014-07-20 DIAGNOSIS — F3160 Bipolar disorder, current episode mixed, unspecified: Secondary | ICD-10-CM

## 2014-07-20 DIAGNOSIS — Z86718 Personal history of other venous thrombosis and embolism: Secondary | ICD-10-CM | POA: Diagnosis not present

## 2014-07-20 DIAGNOSIS — Z8042 Family history of malignant neoplasm of prostate: Secondary | ICD-10-CM | POA: Diagnosis not present

## 2014-07-20 DIAGNOSIS — J9621 Acute and chronic respiratory failure with hypoxia: Secondary | ICD-10-CM | POA: Diagnosis present

## 2014-07-20 DIAGNOSIS — F259 Schizoaffective disorder, unspecified: Secondary | ICD-10-CM | POA: Diagnosis present

## 2014-07-20 DIAGNOSIS — F319 Bipolar disorder, unspecified: Secondary | ICD-10-CM | POA: Diagnosis present

## 2014-07-20 DIAGNOSIS — I2782 Chronic pulmonary embolism: Secondary | ICD-10-CM | POA: Diagnosis present

## 2014-07-20 DIAGNOSIS — I5032 Chronic diastolic (congestive) heart failure: Secondary | ICD-10-CM | POA: Diagnosis present

## 2014-07-20 DIAGNOSIS — Z9114 Patient's other noncompliance with medication regimen: Secondary | ICD-10-CM | POA: Diagnosis present

## 2014-07-20 DIAGNOSIS — N183 Chronic kidney disease, stage 3 (moderate): Secondary | ICD-10-CM | POA: Diagnosis present

## 2014-07-20 DIAGNOSIS — F22 Delusional disorders: Secondary | ICD-10-CM | POA: Diagnosis present

## 2014-07-20 DIAGNOSIS — Z79891 Long term (current) use of opiate analgesic: Secondary | ICD-10-CM | POA: Diagnosis not present

## 2014-07-20 DIAGNOSIS — I272 Other secondary pulmonary hypertension: Secondary | ICD-10-CM | POA: Diagnosis present

## 2014-07-20 DIAGNOSIS — M1990 Unspecified osteoarthritis, unspecified site: Secondary | ICD-10-CM | POA: Diagnosis present

## 2014-07-20 DIAGNOSIS — E039 Hypothyroidism, unspecified: Secondary | ICD-10-CM | POA: Diagnosis present

## 2014-07-20 DIAGNOSIS — Z7901 Long term (current) use of anticoagulants: Secondary | ICD-10-CM | POA: Diagnosis not present

## 2014-07-20 DIAGNOSIS — Z79899 Other long term (current) drug therapy: Secondary | ICD-10-CM | POA: Diagnosis not present

## 2014-07-20 LAB — PROTIME-INR
INR: 1.79 — AB (ref 0.00–1.49)
Prothrombin Time: 21 seconds — ABNORMAL HIGH (ref 11.6–15.2)

## 2014-07-20 MED ORDER — VALPROATE SODIUM 500 MG/5ML IV SOLN
500.0000 mg | Freq: Two times a day (BID) | INTRAVENOUS | Status: DC
Start: 2014-07-20 — End: 2014-07-21
  Administered 2014-07-21: 500 mg via INTRAVENOUS
  Filled 2014-07-20 (×5): qty 5

## 2014-07-20 MED ORDER — LEVOTHYROXINE SODIUM 100 MCG IV SOLR
56.0000 ug | Freq: Every day | INTRAVENOUS | Status: DC
Start: 2014-07-21 — End: 2014-07-21
  Filled 2014-07-20: qty 5

## 2014-07-20 MED ORDER — WARFARIN SODIUM 7.5 MG PO TABS
7.5000 mg | ORAL_TABLET | Freq: Once | ORAL | Status: DC
  Filled 2014-07-20: qty 1

## 2014-07-20 MED ORDER — ENOXAPARIN SODIUM 80 MG/0.8ML ~~LOC~~ SOLN
75.0000 mg | SUBCUTANEOUS | Status: DC
  Filled 2014-07-20 (×2): qty 0.8

## 2014-07-20 MED ORDER — HALOPERIDOL LACTATE 5 MG/ML IJ SOLN
5.0000 mg | Freq: Four times a day (QID) | INTRAMUSCULAR | Status: DC | PRN
  Administered 2014-07-20 – 2014-07-21 (×2): 5 mg via INTRAVENOUS
  Filled 2014-07-20 (×2): qty 1

## 2014-07-20 MED ORDER — HALOPERIDOL LACTATE 5 MG/ML IJ SOLN: 5.0000 mg | Freq: Once | INTRAMUSCULAR | Status: DC
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
## 2014-07-20 NOTE — Progress Notes (Signed)
PT Cancellation Note  Patient Details Name: Michaela Horton MRN: [id number] DOB: 10-31-1940   Cancelled Treatment:    Reason Eval/Treat Not Completed: Patient declined, no reason specified (pt stated she couldn't walk without her shoes, encouraged her to have family bring in her shoes. ) Will follow.    Blondell Reveal Kistler 07/20/2014, 10:38 AM  [phone]

## 2014-07-20 NOTE — Progress Notes (Signed)
Patient has refused any of her ordered meds during the shift 0700 - 1700; particularly any meds going through the iv. She states that this RN has been trying to give her Depakote through the iv, which is untrue. MD was called to request the patient be transferred to a floor where staff has more experience dealing with psychiatric patients. The decision was made to transfer to 5-E, and this RN has initiated the call to the 5th floor nurse to initiate transfer. Awaiting call-back.

## 2014-07-20 NOTE — Progress Notes (Addendum)
Progress Note   Michaela Horton WEX:[id number] DOB: 09/27/1940 DOA: 07/18/2014 PCP: Gerrit Heck, MD   Brief Narrative:   Michaela Horton is an 73 y.o. female with a PMH of PE on chronic Coumadin, schizoaffective/bipolar disorder, hypothyroidism, chronic respiratory failure secondary to pulmonary hypertension on chronic oxygen who was admitted 07/18/14 with confusion and delusions.  Assessment/Plan:   Principal Problem:   Acute encephalopathy / delusions in the setting of a known history of schizoaffective bipolar disorder  CT of the head negative.  Seen by psychiatrist 07/19/14 with recommendations to increase Depakote and add Zyprexa for paranoid/grandiose delusions.  Nursing staff reports that the patient has been refusing her medications, so we'll switch to IV Depakote and Haldol IV as needed for agitation.  Active Problems:   Hypothyroidism  Change Synthroid to IV route since the patient has been refusing her medications.    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  Start on therapeutic dose Lovenox.     Chronic respiratory failure with hypoxia  Provide supplemental oxygen.    DVT Prophylaxis   Therapeutic dose Lovenox ordered.  Code Status: Full. Family Communication: No family at the bedside.  Michaela Horton [phone]), son updated by telephone. Disposition Plan: Home when stable.   IV Access:    Peripheral IV   Procedures and diagnostic studies:   Ct Head Wo Contrast 07/18/2014: No evidence of acute intracranial abnormality.  Mild small vessel ischemic changes.     Dg Chest Port 1 View 07/18/2014: No active disease.     Medical Consultants:    Psychiatry: Durward Parcel, MD  Anti-Infectives:    None.  Subjective:    Michaela Horton is yelling out, paranoid, swearing, unable to focus or answer questions appropriately. She has been refusing  her medications.  Objective:    Filed Vitals:   07/19/14 1335 07/19/14 2055 07/20/14 0500 07/20/14 0515  BP: 126/92 128/83 126/80   Pulse: 97 97 89   Temp: 97.5 F (36.4 C) 98.8 F (37.1 C) 98 F (36.7 C)   TempSrc: Oral Oral Oral   Resp: 16 16 16    Height:      Weight:      SpO2: 92% 88% 84% 90%    Intake/Output Summary (Last 24 hours) at 07/20/14 0827 Last data filed at 07/19/14 1930  Gross per 24 hour  Intake    720 ml  Output      0 ml  Net    720 ml    Exam: Gen:  Agitated Cardiovascular:  RRR, No M/R/G Respiratory:  Lungs CTAB Gastrointestinal:  Abdomen soft, NT/ND, + BS Extremities:  No C/E/C   Data Reviewed:    Labs: Basic Metabolic Panel:  Recent Labs Lab 07/17/14 1011 07/18/14 1246 07/19/14 0445  NA 140 136* 140  K 4.0 3.7 4.0  CL  --  103 108  CO2 24 23 23   GLUCOSE 94 90 93  BUN 20.4 22 18   CREATININE 1.2* 1.13* 1.03  CALCIUM 9.4 9.6 8.9   GFR Estimated Creatinine Clearance: 41.4 mL/min (by C-G formula based on Cr of 1.03). Liver Function Tests:  Recent Labs Lab 07/17/14 1011  AST 28  ALT 30  ALKPHOS 43  BILITOT 0.76  PROT 8.1  ALBUMIN 3.7   Coagulation profile  Recent Labs Lab 07/18/14 1248 07/19/14 0445 07/20/14 0449  INR 2.29* 2.40* 1.79*    CBC:  Recent Labs Lab 07/17/14 1011 07/18/14 1246 07/19/14 0445  WBC  4.2 4.1 3.6*  NEUTROABS 2.1 1.8  --   HGB 14.6 14.6 12.9  HCT 45.3 43.9 39.5  MCV 95.6 95.6 93.6  PLT 279 279 267   Cardiac Enzymes:  Recent Labs Lab 07/18/14 1246  TROPONINI <0.30   BNP (last 3 results)  Recent Labs  10/27/13 1641 07/07/14 2158 07/18/14 1246  PROBNP 848.8* 5315.0* 6961.0*   Thyroid function studies:  Recent Labs  07/19/14 1239  TSH 1.100   Microbiology Recent Results (from the past 240 hour(s))  Urine Culture     Status: None   Collection Time: 07/17/14 10:11 AM  Result Value Ref Range Status   Urine Culture, Routine Culture, Urine  Final    Comment: Final -  ===== COLONY COUNT: ===== NO GROWTH NO GROWTH      Medications:   . B-complex with vitamin C  1 tablet Oral Daily  . bosentan  62.5 mg Oral BID  . divalproex  500 mg Oral BID  . furosemide  20 mg Intravenous Daily  . lactose free nutrition  237 mL Oral BID BM  . levothyroxine  112 mcg Oral QAC breakfast  . multivitamin with minerals  1 tablet Oral q morning - 10a  . OLANZapine zydis  5 mg Oral QHS  . spironolactone  50 mg Oral Daily  . vitamin C  500 mg Oral Daily  . warfarin  5 mg Oral ONCE-1800  . Warfarin - Pharmacist Dosing Inpatient   Does not apply q1800   Continuous Infusions:   Time spent: 25 minutes.   LOS: 2 days   Michaela Horton  [facility] Pager [phone]. If unable to reach me by pager, please call my cell phone at [phone].  *Please refer to [email or web address], password [email or web address] to get updated schedule on who will round on this patient, as hospitalists switch teams weekly. If 7PM-7AM, please contact night-coverage at [email or web address], password [email or web address] for any overnight needs.  07/20/2014, 8:27 AM

## 2014-07-20 NOTE — Progress Notes (Signed)
Michaela Horton was easily agitated when this RN went in to give her the ativan she requested. The pt. Stated that she was not going to let anyone near her iv, that the iv was bothering her are and that she wanted it out. MD notified of patient's refusal. This RN will contact psychiatrist.

## 2014-07-20 NOTE — Progress Notes (Addendum)
Clinical Social Work  Per chart review, psych MD recommending inpatient placement and family agreeable due to patient's recent behaviors. CSW contacted the following facilities re: placement:  [facility]- no available beds  [facility]- L/M with admissions re: bed availability   [facility] #:[id number] valid from 11/20-11/26  Referral faxed and phone interview completed. (Addendum 1600: Connie in admissions called and requested that 2D echo from the past 6 months. CSW made attending MD aware.)  [facility]- available beds. Referral faxed  [facility]- no available beds.  [facility]- no available beds  [facility]- denied on 11/20 due to behaviors  [facility]- no available beds  [facility]- available beds. Referral faxed  [facility]- available beds. Referral faxed.  Facilities report they will only accept patient under IVC due to recent behaviors so attending MD signed IVC forms which were faxed to the [facility] and CSW called to confirm papers were received. Magistrate reports [employer] will serve patient this afternoon. CSW will continue to follow.  [place], [place] [phone]

## 2014-07-20 NOTE — Progress Notes (Addendum)
[place] for Warfarin --> enoxaparin Indication: History of PE  No Known Allergies  Patient Measurements: Height: 5\' 1"  (154.9 cm) Weight: 130 lb 11.2 oz (59.285 kg) IBW/kg (Calculated) : 47.8  Vital Signs: Temp: 98 F (36.7 C) (11/20 0500) Temp Source: Oral (11/20 0500) BP: 126/80 mmHg (11/20 0500) Pulse Rate: 89 (11/20 0500)  Labs:  Recent Labs  07/17/14 1011 07/17/14 1011 07/18/14 1246 07/18/14 1248 07/19/14 0445 07/20/14 0449  HGB 14.6  --  14.6  --  12.9  --   HCT 45.3  --  43.9  --  39.5  --   PLT 279  --  279  --  267  --   LABPROT  --   --   --  25.4* 26.3* 21.0*  INR  --   --   --  2.29* 2.40* 1.79*  CREATININE  --  1.2* 1.13*  --  1.03  --   TROPONINI  --   --  <0.30  --   --   --     Estimated Creatinine Clearance: 41.4 mL/min (by C-G formula based on Cr of 1.03).   Medical History: Past Medical History  Diagnosis Date  . Dyspnea   . Pulmonary embolism   . Breast cyst     right  . Hypothyroidism   . Renal cyst   . Leukocytopenia   . Monoclonal gammopathy   . Osteoarthritis   . Schizoaffective disorder   . Wears dentures   . Pulmonary hypertension     Medications:  Scheduled:  . B-complex with vitamin C  1 tablet Oral Daily  . bosentan  62.5 mg Oral BID  . divalproex  500 mg Oral BID  . furosemide  20 mg Intravenous Daily  . lactose free nutrition  237 mL Oral BID BM  . levothyroxine  112 mcg Oral QAC breakfast  . multivitamin with minerals  1 tablet Oral q morning - 10a  . OLANZapine zydis  5 mg Oral QHS  . spironolactone  50 mg Oral Daily  . vitamin C  500 mg Oral Daily  . warfarin  5 mg Oral ONCE-1800  . Warfarin - Pharmacist Dosing Inpatient   Does not apply q1800   Infusions:   PRN: acetaminophen, albuterol, LORazepam  Assessment: 73 yo female with pulmonary hypertension and bipolar disorder presents 11/18 with acute encephalopathy. On chronic warfarin for history of PE back in  2011.   Dose reported as 5mg  daily - last dose 11/18 AM prior to admit  INR therapeutic at 2.29 on admission  INR today now below therapeutic range at 1.79  Patient is currently refusing all medications except levothyroxine  CBC wnl  No bleeding currently reported  No drug interactions present  Heart healthy diet ordered - patient eating 100%  Goal of Therapy:  INR 2-3   Plan:   Warfarin 7.5mg  PO x 1 tonight to get back into therapeutic range  F/u plans for anticoagulation if patient continues to refuse medications  Daily PT/INR  CBC in AM  Thank you for the consult.  Currie Paris, PharmD, BCPS Pager: [phone] Pharmacy: [phone] 07/20/2014 8:52 AM   Addendum: Patient to be transitioned to full treatment dose enoxaparin due to refusal of PO medications.  Will use 1.5 mg/kg q24h to minimize sticks  CrCl > 30 ml/min  Goal of Therapy:  Anti-Xa level 0.6-1 units/ml 4hrs after LMWH dose given Monitor platelets by anticoagulation protocol: Yes  Plan  Discontinue warfarin  orders and labs  Start enoxaparin 75mg  SQ q24h today at 12 noon  Bmet and CBC in AM  Pharmacy will f/u daily  Thank you for the consult.  Currie Paris, PharmD, BCPS Pager: [phone] Pharmacy: [phone] 07/20/2014 10:18 AM

## 2014-07-20 NOTE — Progress Notes (Signed)
UR completed 

## 2014-07-20 NOTE — Progress Notes (Addendum)
Clinical Social Work  CSW received a return call from dtr [name]) but missed call. CSW called dtr back on work # at [phone] ext [phone] and left a message. CSW will continue to follow to gather collateral information on patient.  Michaela Horton, [place] [phone]  [id number] Dtr returned CSW call and reports that patient has been living by herself. Dtr confirmed information that patient was divorced in the 33s and has 4 children. Dtr confirms that patient used to travel often and still talks about her past. Patient is living alone and dtr checks on her and places her medications in pill boxes. Dtr reports about 2 weeks ago patient started refusing her medication and was throwing them away. Dtr reports patient has been compliant with medications in the past and this is a new behavior. Dtr reports patient does not have 24/7 care but is unsure why patient has stopped taking all of her medications. Patient had [place] services but fired her agency and now does not have any medical assistance at home. Patient was hospitalized at [facility] from May-July 2015 and dtr reports that patient was doing better when she was released. Patient was cooking, cleaning, and bathing herself. Dtr reports patient continues to drive but dtr has filed a claim with the [facility] because she is worried about her ability to drive safely. DMV has informed dtr that patient is allowed to drive while claim is pending. Dtr reports that patient took Lithium for years and it was effective but it had negative side effects on her kidneys so she had to stop taking medication. Patient's medications have been adjusted and she is now taking Depakote but dtr does not feel it is as effective. Dtr reports [place] concerns started in the 1980s but that patient appears more confused and delusional now. Dtr reports patient has been drinking wine but she is unsure how much she consumes. Patient had an appointment with Dr. Emelda Brothers at [place] in October but has  since fired her psychiatrist and does not have any appointments for follow up scheduled. Dtr feels that inpatient placement is needed again due to patient not taking her medications and having unstable mood at home.  CSW will continue to follow.  [place], [place] [phone]

## 2014-07-20 NOTE — Progress Notes (Signed)
Patient refused zyprexa and depakote tonight. Stated that she 'is not bipolar" and "don't need that sh*t."

## 2014-07-21 DIAGNOSIS — I369 Nonrheumatic tricuspid valve disorder, unspecified: Secondary | ICD-10-CM

## 2014-07-21 LAB — BASIC METABOLIC PANEL
Anion gap: 12 (ref 5–15)
BUN: 15 mg/dL (ref 6–23)
CHLORIDE: 100 meq/L (ref 96–112)
CO2: 23 mEq/L (ref 19–32)
CREATININE: 1.08 mg/dL (ref 0.50–1.10)
Calcium: 9.4 mg/dL (ref 8.4–10.5)
GFR calc Af Amer: 58 mL/min — ABNORMAL LOW (ref >90)
GFR calc non Af Amer: 50 mL/min — ABNORMAL LOW (ref >90)
GLUCOSE: 118 mg/dL — AB (ref 70–99)
POTASSIUM: 4 meq/L (ref 3.7–5.3)
Sodium: 135 mEq/L — ABNORMAL LOW (ref 137–147)

## 2014-07-21 LAB — CBC
HEMATOCRIT: 44.4 % (ref 36.0–46.0)
Hemoglobin: 14.8 g/dL (ref 12.0–15.0)
MCH: 31.6 pg (ref 26.0–34.0)
MCHC: 33.3 g/dL (ref 30.0–36.0)
MCV: 94.7 fL (ref 78.0–100.0)
Platelets: 282 10*3/uL (ref 150–400)
RBC: 4.69 MIL/uL (ref 3.87–5.11)
RDW: 15.1 % (ref 11.5–15.5)
WBC: 4.3 10*3/uL (ref 4.0–10.5)

## 2014-07-21 LAB — GLUCOSE, CAPILLARY: Glucose-Capillary: 116 mg/dL — ABNORMAL HIGH (ref 70–99)

## 2014-07-21 MED ORDER — ENOXAPARIN SODIUM 100 MG/ML ~~LOC~~ SOLN
90.0000 mg | SUBCUTANEOUS | Status: DC
  Administered 2014-07-22: 90 mg via SUBCUTANEOUS
  Filled 2014-07-21 (×3): qty 1

## 2014-07-21 MED ORDER — LORAZEPAM 1 MG PO TABS
1.0000 mg | ORAL_TABLET | Freq: Three times a day (TID) | ORAL | Status: DC | PRN
  Administered 2014-07-21 – 2014-07-23 (×6): 1 mg via ORAL
  Filled 2014-07-21 (×8): qty 1

## 2014-07-21 MED ORDER — DIVALPROEX SODIUM ER 500 MG PO TB24
1000.0000 mg | ORAL_TABLET | Freq: Every day | ORAL | Status: DC
  Administered 2014-07-21: 1000 mg via ORAL
  Administered 2014-07-22: 500 mg via ORAL
  Filled 2014-07-21 (×3): qty 2

## 2014-07-21 MED ORDER — LEVOTHYROXINE SODIUM 112 MCG PO TABS
112.0000 ug | ORAL_TABLET | Freq: Every day | ORAL | Status: DC
  Administered 2014-07-21 – 2014-07-23 (×3): 112 ug via ORAL
  Filled 2014-07-21 (×4): qty 1

## 2014-07-21 MED ORDER — HALOPERIDOL LACTATE 5 MG/ML IJ SOLN
5.0000 mg | Freq: Four times a day (QID) | INTRAMUSCULAR | Status: DC | PRN
  Administered 2014-07-23: 5 mg via INTRAMUSCULAR
  Filled 2014-07-21: qty 1

## 2014-07-21 NOTE — Evaluation (Signed)
Physical Therapy Evaluation Patient Details Name: Michaela Horton MRN: [id number] DOB: 1940-10-22 Today's Date: 07/21/2014   History of Present Illness  Michaela Horton is an 73 y.o. female adm [facility] 07/18/14 with confusion and delusions;  PMH of PE on chronic Coumadin, schizoaffective/bipolar disorder, hypothyroidism, chronic respiratory failure secondary to pulmonary hypertension on chronic oxygen .  Clinical Impression  Pt seen for PT eval today but limited pt participation, although she does not appear to have any mobility deficits; pt with excuse again about not having her shoes, persuaded pt to amb with slipper socks, pt agreed to work with PT, after O2 tank retrieved pt was ready to walk in hallway, (donned gown  -- as she was wearing her gown with the front open), then pt refused to go anywhere unless she was going to "her old room to find her tops that are missing";  No further PT needs, this our 4th attempt (3 previous refusals) PT will sign off.    Follow Up Recommendations No PT follow up    Equipment Recommendations  None recommended by PT    Recommendations for Other Services       Precautions / Restrictions Precautions Precautions: None Restrictions Weight Bearing Restrictions: No      Mobility  Bed Mobility               General bed mobility comments: not tested, pt in chair; pt did get out of the recliner while it was in the reclined position  Transfers Overall transfer level: Independent Equipment used: None Transfers: Sit to/from Stand Sit to Stand: Independent         General transfer comment: pt stands repeatedly from chair without assist, no LOB  Ambulation/Gait Ambulation/Gait assistance: Supervision Ambulation Distance (Feet): 6 Feet (in room) Assistive device: None Gait Pattern/deviations: Step-through pattern     General Gait Details: pt moves very quickly; pt agreed to amb in hallway then refused d/t the fact that we could not "get on the  escalator and find her tops"  Stairs            Wheelchair Mobility    Modified Rankin (Stroke Patients Only)       Balance Overall balance assessment: No apparent balance deficits (not formally assessed)                                           Pertinent Vitals/Pain Pain Assessment: No/denies pain    Home Living Family/patient expects to be discharged to:: Private residence Living Arrangements: Alone                    Prior Function Level of Independence: Independent         Comments: pt does not answer most home environment questions; she is perseverating on " her tops" that were left in "her other room"     Hand Dominance   Dominant Hand: Right    Extremity/Trunk Assessment   Upper Extremity Assessment: Overall WFL for tasks assessed           Lower Extremity Assessment: Overall WFL for tasks assessed         Communication   Communication: No difficulties  Cognition Arousal/Alertness: Awake/alert Behavior During Therapy: Anxious Overall Cognitive Status: Difficult to assess Area of Impairment: Awareness               General Comments: pt is  difficult to communicate with d/t psych dx--she is perseverative, easily distracted and has difficulty reasoning    General Comments General comments (skin integrity, edema, etc.): pt able to stand at sink and brush teeth, perform side stepping, completes turns, dons socks in sitting I'ly and picks up objects from floor without LOB    Exercises        Assessment/Plan    PT Assessment [place] does not need any further PT services  PT Diagnosis Altered mental status   PT Problem List    PT Treatment Interventions     PT Goals (Current goals can be found in the Care Plan section) Acute Rehab PT Goals PT Goal Formulation: Patient unable to participate in goal setting    Frequency     Barriers to discharge        Co-evaluation               End of  Session Equipment Utilized During Treatment: Gait belt;Oxygen Activity Tolerance: Other (comment) (limited by cognition/psych/reasoning skills) Patient left: in chair           Time: 1047-1109 PT Time Calculation (min) (ACUTE ONLY): 22 min   Charges:   PT Evaluation $Initial PT Evaluation Tier I: 1 Procedure PT Treatments $Therapeutic Activity: 8-22 mins   PT G Codes:          Blasa Raisch 08-02-2014, 11:25 AM

## 2014-07-21 NOTE — Plan of Care (Signed)
Problem: Spiritual Needs Goal: Ability to function at adequate level Outcome: Not Progressing See nurses note

## 2014-07-21 NOTE — Progress Notes (Signed)
Pt has been anxious, agitated, not easily redirectable. Pulled out IV line. Refuses to have another inserted. MD notified, states ok to leave out if pt is taking PO medications. Pt did take PO medications after encouragement. Pt cursing and swearing, denies all mental health issues. States she will not continue to take these medications when she is discharged home. Educated pt on importance of taking medications. Pt refused all counseling. Will continue to monitor for safety.

## 2014-07-21 NOTE — Progress Notes (Addendum)
Progress Note   Jeanee Fabre ZHY:[id number] DOB: 1941-04-21 DOA: 07/18/2014 PCP: Gerrit Heck, MD   Brief Narrative:   Yenifer Saccente is an 73 y.o. female with a PMH of PE on chronic Coumadin, schizoaffective/bipolar disorder, hypothyroidism, chronic respiratory failure secondary to pulmonary hypertension on chronic oxygen who was admitted 07/18/14 with confusion and delusions.  Assessment/Plan:   Principal Problem:   Acute encephalopathy / delusions in the setting of a known history of schizoaffective bipolar disorder  CT of the head negative.  Seen by psychiatrist 07/19/14 with recommendations to increase Depakote and add Zyprexa for paranoid/grandiose delusions.  Nursing staff reports that the patient has been refusing her medications, so we'll switch to IV Depakote and Haldol IV as needed for agitation.  Active Problems:   Hypothyroidism  Change Synthroid to IV route since the patient has been refusing her medications.    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  Continue therapeutic dose Lovenox.  Psychiatric hospital requests updated 2 D Echo, which has been ordered.     Chronic respiratory failure with hypoxia  Provide supplemental oxygen.    DVT Prophylaxis   Therapeutic dose Lovenox ordered.  Code Status: Full. Family Communication: No family at the bedside.  Dortha Kern [phone]), son updated by telephone. Disposition Plan: Home when stable.   IV Access:    Peripheral IV   Procedures and diagnostic studies:   Ct Head Wo Contrast 07/18/2014: No evidence of acute intracranial abnormality.  Mild small vessel ischemic changes.     Dg Chest Port 1 View 07/18/2014: No active disease.     Medical Consultants:    Psychiatry: Durward Parcel, MD  Anti-Infectives:    None.  Subjective:   Briella Hobday is calmer today, although she still is  quite delusional and has been refusing her medications. She pulled out her IV.  Objective:    Filed Vitals:   07/20/14 0515 07/20/14 1400 07/20/14 2136 07/21/14 0530  BP:  120/78 102/77 114/82  Pulse:  90 89 91  Temp:  98.2 F (36.8 C) 97.6 F (36.4 C) 97.3 F (36.3 C)  TempSrc:  Oral Oral Oral  Resp:  18 18 18   Height:      Weight:      SpO2: 90% 91% 90% 90%    Intake/Output Summary (Last 24 hours) at 07/21/14 0819 Last data filed at 07/20/14 1400  Gross per 24 hour  Intake    480 ml  Output      0 ml  Net    480 ml    Exam: Gen:  Less agitated Cardiovascular:  RRR, No M/R/G Respiratory:  Lungs CTAB Gastrointestinal:  Abdomen soft, NT/ND, + BS Extremities:  No C/E/C   Data Reviewed:    Labs: Basic Metabolic Panel:  Recent Labs Lab 07/17/14 1011 07/18/14 1246 07/19/14 0445  NA 140 136* 140  K 4.0 3.7 4.0  CL  --  103 108  CO2 24 23 23   GLUCOSE 94 90 93  BUN 20.4 22 18   CREATININE 1.2* 1.13* 1.03  CALCIUM 9.4 9.6 8.9   GFR Estimated Creatinine Clearance: 41.4 mL/min (by C-G formula based on Cr of 1.03). Liver Function Tests:  Recent Labs Lab 07/17/14 1011  AST 28  ALT 30  ALKPHOS 43  BILITOT 0.76  PROT 8.1  ALBUMIN 3.7   Coagulation profile  Recent Labs Lab 07/18/14 1248 07/19/14 0445 07/20/14 0449  INR 2.29* 2.40* 1.79*  CBC:  Recent Labs Lab 07/17/14 1011 07/18/14 1246 07/19/14 0445 07/21/14 0731  WBC 4.2 4.1 3.6* 4.3  NEUTROABS 2.1 1.8  --   --   HGB 14.6 14.6 12.9 14.8  HCT 45.3 43.9 39.5 44.4  MCV 95.6 95.6 93.6 94.7  PLT 279 279 267 282   Cardiac Enzymes:  Recent Labs Lab 07/18/14 1246  TROPONINI <0.30   BNP (last 3 results)  Recent Labs  10/27/13 1641 07/07/14 2158 07/18/14 1246  PROBNP 848.8* 5315.0* 6961.0*   Thyroid function studies:  Recent Labs  07/19/14 1239  TSH 1.100   Microbiology Recent Results (from the past 240 hour(s))  Urine Culture     Status: None   Collection Time:  07/17/14 10:11 AM  Result Value Ref Range Status   Urine Culture, Routine Culture, Urine  Final    Comment: Final - ===== COLONY COUNT: ===== NO GROWTH NO GROWTH      Medications:   . B-complex with vitamin C  1 tablet Oral Daily  . enoxaparin (LOVENOX) injection  90 mg Subcutaneous Q24H  . furosemide  20 mg Intravenous Daily  . haloperidol lactate  5 mg Intravenous Once  . lactose free nutrition  237 mL Oral BID BM  . levothyroxine  56 mcg Intravenous Daily  . multivitamin with minerals  1 tablet Oral q morning - 10a  . OLANZapine zydis  5 mg Oral QHS  . spironolactone  50 mg Oral Daily  . valproate sodium  500 mg Intravenous Q12H  . vitamin C  500 mg Oral Daily   Continuous Infusions:   Time spent: 25 minutes.   LOS: 3 days   [facility] Pager [phone]. If unable to reach me by pager, please call my cell phone at [phone].  *Please refer to [email or web address], password [email or web address] to get updated schedule on who will round on this patient, as hospitalists switch teams weekly. If 7PM-7AM, please contact night-coverage at [email or web address], password [email or web address] for any overnight needs.  07/21/2014, 8:19 AM

## 2014-07-21 NOTE — Progress Notes (Signed)
Patient pending inpatient geriatric psych treatment.   CSW spoke with Rosana Hoes to f/u on referral: at capacity follow up on Monday for possible discharges.  CSW spoke with Shirlee Limerick at [facility]: pt declined due to meeting therapeutic benefit.  CSW spoke with Clide Deutscher: pt declined recommending med psych.   [place] requested a 2D echocardiogram, faxed to [facility].   Noreene Larsson [phone]  ED CSW 07/21/2014 3:51 PM

## 2014-07-21 NOTE — Progress Notes (Signed)
ANTICOAGULATION CONSULT NOTE  Pharmacy Consult for enoxaparin Indication: History of PE  No Known Allergies  Patient Measurements: Height: 5\' 1"  (154.9 cm) Weight: 130 lb 11.2 oz (59.285 kg) IBW/kg (Calculated) : 47.8  Vital Signs: Temp: 97.3 F (36.3 C) (11/21 0530) Temp Source: Oral (11/21 0530) BP: 114/82 mmHg (11/21 0530) Pulse Rate: 91 (11/21 0530)  Labs:  Recent Labs  07/18/14 1246 07/18/14 1248 07/19/14 0445 07/20/14 0449  HGB 14.6  --  12.9  --   HCT 43.9  --  39.5  --   PLT 279  --  267  --   LABPROT  --  25.4* 26.3* 21.0*  INR  --  2.29* 2.40* 1.79*  CREATININE 1.13*  --  1.03  --   TROPONINI <0.30  --   --   --     Estimated Creatinine Clearance: 41.4 mL/min (by C-G formula based on Cr of 1.03).   Medical History: Past Medical History  Diagnosis Date  . Dyspnea   . Pulmonary embolism   . Breast cyst     right  . Hypothyroidism   . Renal cyst   . Leukocytopenia   . Monoclonal gammopathy   . Osteoarthritis   . Schizoaffective disorder   . Wears dentures   . Pulmonary hypertension     Medications:  Scheduled:  . B-complex with vitamin C  1 tablet Oral Daily  . enoxaparin (LOVENOX) injection  90 mg Subcutaneous Q24H  . furosemide  20 mg Intravenous Daily  . haloperidol lactate  5 mg Intravenous Once  . lactose free nutrition  237 mL Oral BID BM  . levothyroxine  56 mcg Intravenous Daily  . multivitamin with minerals  1 tablet Oral q morning - 10a  . OLANZapine zydis  5 mg Oral QHS  . spironolactone  50 mg Oral Daily  . valproate sodium  500 mg Intravenous Q12H  . vitamin C  500 mg Oral Daily   Infusions:   PRN: acetaminophen, albuterol, haloperidol lactate, LORazepam  Assessment: 73 yo female with pulmonary hypertension and bipolar disorder presents 11/18 with acute encephalopathy. On chronic warfarin for history of PE back in 2011.  Due to patient refusal of PO medications, orders were received 11/20 to discontinue warfarin and begin  enoxaparin.  A once-daily enoxaparin regimen of 75mg  daily was selected to minimize # of sticks.  Goal of therapy: Full-dose anticoagulation with enoxaparin at dosage appropriate for weight and renal function   Today, 11/21:  Yesterday's enoxaparin dose charted as not given, patient refused.  Patient's actual body weight was charted as 59.3 kg on 11/18  SCr improving, CrCl > 30 mL/min  Plan: Increase Lovenox to 90 mg SQ q24h Follow SCr, CBC, clinical course  Clayburn Pert, PharmD, BCPS Pager: [phone] 07/21/2014  7:46 AM

## 2014-07-21 NOTE — Progress Notes (Signed)
Echocardiogram 2D Echocardiogram has been performed.  Doyle Askew 07/21/2014, 1:45 PM

## 2014-07-22 LAB — CBC
HCT: 39.3 % (ref 36.0–46.0)
HEMOGLOBIN: 13.1 g/dL (ref 12.0–15.0)
MCH: 31.3 pg (ref 26.0–34.0)
MCHC: 33.3 g/dL (ref 30.0–36.0)
MCV: 94 fL (ref 78.0–100.0)
Platelets: 239 10*3/uL (ref 150–400)
RBC: 4.18 MIL/uL (ref 3.87–5.11)
RDW: 15.2 % (ref 11.5–15.5)
WBC: 4.3 10*3/uL (ref 4.0–10.5)

## 2014-07-22 LAB — BASIC METABOLIC PANEL
Anion gap: 10 (ref 5–15)
BUN: 15 mg/dL (ref 6–23)
CALCIUM: 8.9 mg/dL (ref 8.4–10.5)
CO2: 23 meq/L (ref 19–32)
CREATININE: 0.98 mg/dL (ref 0.50–1.10)
Chloride: 106 mEq/L (ref 96–112)
GFR calc Af Amer: 66 mL/min — ABNORMAL LOW (ref >90)
GFR, EST NON AFRICAN AMERICAN: 57 mL/min — AB (ref >90)
GLUCOSE: 103 mg/dL — AB (ref 70–99)
Potassium: 4.3 mEq/L (ref 3.7–5.3)
Sodium: 139 mEq/L (ref 137–147)

## 2014-07-22 MED ORDER — GUAIFENESIN-DM 100-10 MG/5ML PO SYRP
5.0000 mL | ORAL_SOLUTION | ORAL | Status: DC | PRN
  Administered 2014-07-22 – 2014-07-23 (×4): 5 mL via ORAL
  Filled 2014-07-22 (×4): qty 10

## 2014-07-22 NOTE — Progress Notes (Signed)
ANTICOAGULATION CONSULT NOTE  Pharmacy Consult for enoxaparin Indication: History of PE  No Known Allergies  Patient Measurements: Height: 5\' 1"  (154.9 cm) Weight: 130 lb 11.2 oz (59.285 kg) IBW/kg (Calculated) : 47.8  Vital Signs: Temp: 98.1 F (36.7 C) (11/22 0742) Temp Source: Oral (11/22 0742) BP: 133/83 mmHg (11/22 0742) Pulse Rate: 87 (11/22 0742)  Labs:  Recent Labs  07/20/14 0449 07/21/14 0731 07/22/14 0511  HGB  --  14.8 13.1  HCT  --  44.4 39.3  PLT  --  282 239  LABPROT 21.0*  --   --   INR 1.79*  --   --   CREATININE  --  1.08 0.98    Estimated Creatinine Clearance: 43.6 mL/min (by C-G formula based on Cr of 0.98).   Medical History: Past Medical History  Diagnosis Date  . Dyspnea   . Pulmonary embolism   . Breast cyst     right  . Hypothyroidism   . Renal cyst   . Leukocytopenia   . Monoclonal gammopathy   . Osteoarthritis   . Schizoaffective disorder   . Wears dentures   . Pulmonary hypertension     Medications:  Scheduled:  . B-complex with vitamin C  1 tablet Oral Daily  . divalproex  1,000 mg Oral Daily  . enoxaparin (LOVENOX) injection  90 mg Subcutaneous Q24H  . furosemide  20 mg Intravenous Daily  . lactose free nutrition  237 mL Oral BID BM  . levothyroxine  112 mcg Oral QAC breakfast  . multivitamin with minerals  1 tablet Oral q morning - 10a  . OLANZapine zydis  5 mg Oral QHS  . spironolactone  50 mg Oral Daily  . vitamin C  500 mg Oral Daily   Infusions:   PRN: acetaminophen, albuterol, guaiFENesin-dextromethorphan, haloperidol lactate, LORazepam  Assessment: 73 yo female with pulmonary hypertension and bipolar disorder presents 11/18 with acute encephalopathy. On chronic warfarin for history of PE back in 2011.  Due to patient refusal of PO medications, orders were received 11/20 to discontinue warfarin and begin enoxaparin.  A once-daily enoxaparin regimen of 75mg  daily was selected to minimize # of sticks.  Goal of  therapy: Full-dose anticoagulation with enoxaparin at dosage appropriate for weight and renal function   Today, 11/22:  The 2 previous days enoxaparin dose charted as not given, patient refused.  Patient's actual body weight was charted as 59.3 kg on 11/18  SCr improving, CrCl > 40 mL/min  Plan:  Lovenox to 90 mg SQ q24h  Follow SCr, CBC, clinical course  Dolly Rias RPh 07/22/2014, 9:53 AM Pager [phone]

## 2014-07-22 NOTE — Progress Notes (Signed)
Progress Note   Michaela Horton ZOX:[id number] DOB: [birth year] DOA: 07/18/2014 PCP: Gerrit Heck, MD   Brief Narrative:   Michaela Horton is an 73 y.o. female with a PMH of PE on chronic Coumadin, schizoaffective/bipolar disorder, hypothyroidism, chronic respiratory failure secondary to pulmonary hypertension on chronic oxygen who was admitted 07/18/14 with confusion and delusions.  Assessment/Plan:   Principal Problem:   Acute encephalopathy / delusions in the setting of a known history of schizoaffective bipolar disorder  CT of the head negative.  Seen by psychiatrist 07/19/14 with recommendations to increase Depakote and add Zyprexa for paranoid/grandiose delusions.  Active Problems:   Hypothyroidism  Continue Synthroid.    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  Continue therapeutic dose Lovenox.  [facility] hospital requests updated 2 D Echo, which was done 07/21/14 with findings as noted below.     Chronic respiratory failure with hypoxia  Provide supplemental oxygen.    DVT Prophylaxis   Therapeutic dose Lovenox ordered.  Code Status: Full. Family Communication: No family at the bedside.  Dortha Kern [phone]), son updated by telephone 07/20/14. Disposition Plan: Home when stable.   IV Access:    Peripheral IV   Procedures and diagnostic studies:   Ct Head Wo Contrast 07/18/2014: No evidence of acute intracranial abnormality.  Mild small vessel ischemic changes.     Dg Chest Port 1 View 07/18/2014: No active disease.    2-D echocardiogram 07/22/14:EF 65-70 percent. Grade 1 diastolic dysfunction. Severe pulmonary hypertension.   Medical Consultants:    Psychiatry: Durward Parcel, MD  Anti-Infectives:    None.  Subjective:   Michaela Horton continues to be delusional and voicing inappropriate comments, but less agitated.  No physical  complaints.  Objective:    Filed Vitals:   07/21/14 0530 07/21/14 2138 07/22/14 0622 07/22/14 0742  BP: 114/82 118/67 120/90 123/65  Pulse: 91 84 80 79  Temp: 97.3 F (36.3 C) 98.2 F (36.8 C) 98.3 F (36.8 C) 97.4 F (36.3 C)  TempSrc: Oral Oral Oral Oral  Resp: 18 18 18 18   Height:      Weight:      SpO2: 90% 89% 92% 97%    Intake/Output Summary (Last 24 hours) at 07/22/14 5621 Last data filed at 07/21/14 1100  Gross per 24 hour  Intake    240 ml  Output      0 ml  Net    240 ml    Exam: Gen:  Less agitated, remains delusional Cardiovascular:  RRR, No M/R/G Respiratory:  Lungs CTAB Gastrointestinal:  Abdomen soft, NT/ND, + BS Extremities:  No C/E/C   Data Reviewed:    Labs: Basic Metabolic Panel:  Recent Labs Lab 07/17/14 1011  07/18/14 1246 07/19/14 0445 07/21/14 0731 07/22/14 0511  NA 140  --  136* 140 135* 139  K 4.0  < > 3.7 4.0 4.0 4.3  CL  --   --  103 108 100 106  CO2 24  --  23 23 23 23   GLUCOSE 94  --  90 93 118* 103*  BUN 20.4  --  22 18 15 15   CREATININE 1.2*  --  1.13* 1.03 1.08 0.98  CALCIUM 9.4  --  9.6 8.9 9.4 8.9  < > = values in this interval not displayed. GFR Estimated Creatinine Clearance: 43.6 mL/min (by C-G formula based on Cr of 0.98). Liver Function Tests:  Recent Labs Lab 07/17/14 1011  AST 28  ALT 30  ALKPHOS 43  BILITOT 0.76  PROT 8.1  ALBUMIN 3.7   Coagulation profile  Recent Labs Lab 07/18/14 1248 07/19/14 0445 07/20/14 0449  INR 2.29* 2.40* 1.79*    CBC:  Recent Labs Lab 07/17/14 1011 07/18/14 1246 07/19/14 0445 07/21/14 0731 07/22/14 0511  WBC 4.2 4.1 3.6* 4.3 4.3  NEUTROABS 2.1 1.8  --   --   --   HGB 14.6 14.6 12.9 14.8 13.1  HCT 45.3 43.9 39.5 44.4 39.3  MCV 95.6 95.6 93.6 94.7 94.0  PLT 279 279 267 282 239   Cardiac Enzymes:  Recent Labs Lab 07/18/14 1246  TROPONINI <0.30   BNP (last 3 results)  Recent Labs  10/27/13 1641 07/07/14 2158 07/18/14 1246  PROBNP 848.8*  5315.0* 6961.0*   Thyroid function studies:  Recent Labs  07/19/14 1239  TSH 1.100   Microbiology Recent Results (from the past 240 hour(s))  Urine Culture     Status: None   Collection Time: 07/17/14 10:11 AM  Result Value Ref Range Status   Urine Culture, Routine Culture, Urine  Final    Comment: Final - ===== COLONY COUNT: ===== NO GROWTH NO GROWTH      Medications:   . B-complex with vitamin C  1 tablet Oral Daily  . divalproex  1,000 mg Oral Daily  . enoxaparin (LOVENOX) injection  90 mg Subcutaneous Q24H  . furosemide  20 mg Intravenous Daily  . lactose free nutrition  237 mL Oral BID BM  . levothyroxine  112 mcg Oral QAC breakfast  . multivitamin with minerals  1 tablet Oral q morning - 10a  . OLANZapine zydis  5 mg Oral QHS  . spironolactone  50 mg Oral Daily  . vitamin C  500 mg Oral Daily   Continuous Infusions:   Time spent: 25 minutes.   LOS: 4 days   [facility] Pager [phone]. If unable to reach me by pager, please call my cell phone at [phone].  *Please refer to [email or web address], password [email or web address] to get updated schedule on who will round on this patient, as hospitalists switch teams weekly. If 7PM-7AM, please contact night-coverage at [email or web address], password [email or web address] for any overnight needs.  07/22/2014, 8:12 AM

## 2014-07-23 DIAGNOSIS — Z9119 Patient's noncompliance with other medical treatment and regimen: Secondary | ICD-10-CM

## 2014-07-23 MED ORDER — BOOST PLUS PO LIQD: 237.0000 mL | Freq: Two times a day (BID) | ORAL | Status: AC

## 2014-07-23 MED ORDER — DIVALPROEX SODIUM ER 500 MG PO TB24: 500.0000 mg | ORAL_TABLET | Freq: Two times a day (BID) | ORAL | Status: AC

## 2014-07-23 MED ORDER — OLANZAPINE 5 MG PO TBDP
5.0000 mg | ORAL_TABLET | Freq: Every day | ORAL | Status: DC
Start: 2014-07-23 — End: 2015-02-04

## 2014-07-23 MED ORDER — ACETAMINOPHEN 325 MG PO TABS: 650.0000 mg | ORAL_TABLET | Freq: Four times a day (QID) | ORAL | Status: AC | PRN

## 2014-07-23 MED ORDER — ENOXAPARIN SODIUM 100 MG/ML ~~LOC~~ SOLN: 90.0000 mg | SUBCUTANEOUS | Status: DC
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
## 2014-07-23 NOTE — Progress Notes (Signed)
Clinical Social Work  CSW continues to search for placement for patient. CSW contacted the following facilities:  [facility]- no available beds  [facility]- L/M with admissions re: bed availability   [facility] who confirms patient is on waiting list  [facility]- admissions confirms that referral was received but no available beds at this time.   [facility]- available beds. Referral faxed.  [facility]- no available beds  [facility]- denied on 11/20 due to behaviors  [facility]- available beds. Referral faxed.  [facility]- denied on 11/21  [facility]- denied on 11/21  CSW will continue to follow.  [place], [place] [phone]

## 2014-07-23 NOTE — Progress Notes (Signed)
Clinical Social Work  Patient accepted to [facility] by Dr. Minette Brine. Patient will go to room 9444. CSW explained DC plans to dtr Michaela Horton) and to patient. Patient upset and reports she does not want to go to [place] but understanding of IVC. RN to call report to [phone] or [phone]. CSW spoke with [place] in order to make arrangements for transportation and will keep RN updated on DC time.  [place], [place] [phone]

## 2014-07-23 NOTE — Discharge Summary (Signed)
Physician Discharge Summary  Michaela Horton MHD:[id number] DOB: [birth year] DOA: 07/18/2014  PCP: Gerrit Heck, MD  Admit date: 07/18/2014 Discharge date: 07/23/2014   Recommendations for Outpatient Follow-Up:   1. Please note that the patient is being discharged on both Lovenox and Coumadin as she has not been taking her Coumadin consistently and therefore will need to have her PT/INR checked daily with Lovenox therapy on board until her INR is therapeutic for 48 hours. Lovenox can be discontinued at that time. Goal INR 2-3. 2. Continue supplemental oxygen therapy for her chronic thromboembolic disease/chronic pulmonary emboli.   Discharge Diagnosis:   Principal Problem:    Acute delusional behavior with agitation related to bipolar disorder Active Problems:    Hypothyroidism    Chronic thromboembolic pulmonary hypertension    Chronic respiratory failure with hypoxia    Bipolar disorder    Hypoxia   Discharge Condition: Medically stable.  Diet recommendation: Regular.   History of Present Illness:   Michaela Horton is an 73 y.o. female with a PMH of PE on chronic Coumadin, schizoaffective/bipolar disorder, hypothyroidism, chronic respiratory failure secondary to pulmonary hypertension on chronic oxygen who was admitted 07/18/14 with confusion and delusions.  Hospital Course by Problem:   Principal Problem:  Acute encephalopathy / delusions in the setting of a known history of schizoaffective bipolar disorder  CT of the head negative.  Seen by psychiatrist 07/19/14 with recommendations to increase Depakote and add Zyprexa for paranoid/grandiose delusions.  Over the course of the patient's hospital stay, it became clear that her symptoms were primarily psychiatric in nature and triggered by self discontinuation of her psychiatric medications upon discharge from her previous psychiatric hospital stay.  She continues to experience paranoid delusions and  oppositional behavior with nursing staff. She is only intermittently compliant with her oral medications. She has required IV/IM Haldol for behavioral issues.  Active Problems:  Hypothyroidism  Continue Synthroid.   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  Continue therapeutic dose Lovenox until Coumadin levels are therapeutic for 48 hours.  Psychiatric hospital requests updated 2 D Echo, which was done 07/21/14 with findings as noted below.   Chronic respiratory failure with hypoxia  Continue supplemental oxygen.    Medical Consultants:    Psychiatry: Durward Parcel, MD   Discharge Exam:   Filed Vitals:   07/23/14 0533  BP: 124/87  Pulse: 79  Temp: 98.2 F (36.8 C)  Resp: 18   Filed Vitals:   07/22/14 0742 07/22/14 1400 07/22/14 2200 07/23/14 0533  BP: 133/83 126/81 131/85 124/87  Pulse: 87 84 95 79  Temp: 98.1 F (36.7 C) 97.3 F (36.3 C) 98.1 F (36.7 C) 98.2 F (36.8 C)  TempSrc: Oral Oral Oral Oral  Resp: 18 18 20 18   Height:      Weight:      SpO2: 92% 91% 85% 93%    Gen:  Delusional, agitated Cardiovascular:  RRR, No M/R/G Respiratory: Lungs CTAB Gastrointestinal: Abdomen soft, NT/ND with normal active bowel sounds. Extremities: No C/E/C   The results of significant diagnostics from this hospitalization (including imaging, microbiology, ancillary and laboratory) are listed below for reference.     Procedures and Diagnostic Studies:   Ct Head Wo Contrast 07/18/2014: No evidence of acute intracranial abnormality. Mild small vessel ischemic changes.   Dg Chest Port 1 View 07/18/2014: No active disease.   2-D echocardiogram 07/22/14:EF 65-70 percent. Grade 1 diastolic dysfunction. Severe pulmonary hypertension.   Labs:  Basic Metabolic Panel:  Recent Labs Lab 07/17/14 1011  07/18/14 1246 07/19/14 0445 07/21/14 0731 07/22/14 0511  NA 140  --   136* 140 135* 139  K 4.0  < > 3.7 4.0 4.0 4.3  CL  --   --  103 108 100 106  CO2 24  --  23 23 23 23   GLUCOSE 94  --  90 93 118* 103*  BUN 20.4  --  22 18 15 15   CREATININE 1.2*  --  1.13* 1.03 1.08 0.98  CALCIUM 9.4  --  9.6 8.9 9.4 8.9  < > = values in this interval not displayed. GFR Estimated Creatinine Clearance: 43.6 mL/min (by C-G formula based on Cr of 0.98). Liver Function Tests:  Recent Labs Lab 07/17/14 1011  AST 28  ALT 30  ALKPHOS 43  BILITOT 0.76  PROT 8.1  ALBUMIN 3.7   Coagulation profile  Recent Labs Lab 07/18/14 1248 07/19/14 0445 07/20/14 0449  INR 2.29* 2.40* 1.79*    CBC:  Recent Labs Lab 07/17/14 1011 07/18/14 1246 07/19/14 0445 07/21/14 0731 07/22/14 0511  WBC 4.2 4.1 3.6* 4.3 4.3  NEUTROABS 2.1 1.8  --   --   --   HGB 14.6 14.6 12.9 14.8 13.1  HCT 45.3 43.9 39.5 44.4 39.3  MCV 95.6 95.6 93.6 94.7 94.0  PLT 279 279 267 282 239   Cardiac Enzymes:  Recent Labs Lab 07/18/14 1246  TROPONINI <0.30   BNP: Invalid input(s): POCBNP CBG:  Recent Labs Lab 07/21/14 2137  GLUCAP 116*   Microbiology Recent Results (from the past 240 hour(s))  Urine Culture     Status: None   Collection Time: 07/17/14 10:11 AM  Result Value Ref Range Status   Urine Culture, Routine Culture, Urine  Final    Comment: Final - ===== COLONY COUNT: ===== NO GROWTH NO GROWTH      Discharge Instructions:   Discharge Instructions    Activity as tolerated - No restrictions    Complete by:  As directed      Call MD for:    Complete by:  As directed   Difficulty breathing, chest pain, worsening lower extremity swelling.     Diet general    Complete by:  As directed      Discharge instructions    Complete by:  As directed   You will need to take Lovenox injections until your coumadin levels are therapeutic for 2 days.            Medication List    STOP taking these medications        ROBITUSSIN MAXIMUM STRENGTH PO     temazepam 15 MG  capsule  Commonly known as:  RESTORIL     traMADol 50 MG tablet  Commonly known as:  ULTRAM      TAKE these medications        acetaminophen 325 MG tablet  Commonly known as:  TYLENOL  Take 2 tablets (650 mg total) by mouth every 6 (six) hours as needed for mild pain, fever or headache.     albuterol 108 (90 BASE) MCG/ACT inhaler  Commonly known as:  PROVENTIL HFA;VENTOLIN HFA  Inhale 1-2 puffs into the lungs every 6 (six) hours as needed for wheezing or shortness of breath.     B-complex with vitamin C tablet  Take 1 tablet by mouth daily.     divalproex 500 MG 24 hr tablet  Commonly known as:  DEPAKOTE ER  Take  1 tablet (500 mg total) by mouth 2 (two) times daily.     enoxaparin 100 MG/ML injection  Commonly known as:  LOVENOX  Inject 0.9 mLs (90 mg total) into the skin daily.     furosemide 20 MG tablet  Commonly known as:  LASIX  Take 20 mg by mouth daily.     lactose free nutrition Liqd  Take 237 mLs by mouth 2 (two) times daily between meals.     levothyroxine 112 MCG tablet  Commonly known as:  SYNTHROID, LEVOTHROID  Take 112 mcg by mouth daily before breakfast.     LORazepam 1 MG tablet  Commonly known as:  ATIVAN  Take 1 tablet by mouth 2 (two) times daily as needed for anxiety or sleep.     multivitamin with minerals Tabs tablet  Take 1 tablet by mouth every morning.     OLANZapine zydis 5 MG disintegrating tablet  Commonly known as:  ZYPREXA  Take 1 tablet (5 mg total) by mouth at bedtime.     spironolactone 50 MG tablet  Commonly known as:  ALDACTONE  Take 50 mg by mouth daily.     vitamin C 500 MG tablet  Commonly known as:  ASCORBIC ACID  Take 500 mg by mouth daily.     warfarin 5 MG tablet  Commonly known as:  COUMADIN  Take 5 mg by mouth daily at 6 PM.          Time coordinating discharge: 35 minutes.  Signed:  Wendi Lastra  Pager [phone] [facility] 07/23/2014, 2:59 PM

## 2014-07-23 NOTE — Progress Notes (Signed)
Patient agitated, cursing, stating that we are trying to trick her. She states that she will not listen to [name] or the doctors here. States that she is the only who can decide what is done to her. Delusional and tearful at this time, stating that she is white, Obama is her lover, talking about a man who did not love her, and so forth. Pt. Sitting in chair, refusing to get into to bed. Agreed to place nasal cannula/oxygen back on.

## 2014-07-24 ENCOUNTER — Ambulatory Visit: Payer: Medicare Other | Admitting: Hematology

## 2014-07-24 LAB — SPEP & IFE WITH QIG
Albumin ELP: 50.8 % — ABNORMAL LOW (ref 55.8–66.1)
Alpha-1-Globulin: 4.5 % (ref 2.9–4.9)
Alpha-2-Globulin: 7 % — ABNORMAL LOW (ref 7.1–11.8)
BETA 2: 3.4 % (ref 3.2–6.5)
BETA GLOBULIN: 5.1 % (ref 4.7–7.2)
GAMMA GLOBULIN: 29.2 % — AB (ref 11.1–18.8)
IGA: 70 mg/dL (ref 69–380)
IGG (IMMUNOGLOBIN G), SERUM: 2330 mg/dL — AB (ref 690–1700)
IgM, Serum: 39 mg/dL — ABNORMAL LOW (ref 52–322)
M-SPIKE, %: 1.82 g/dL
Total Protein, Serum Electrophoresis: 7.6 g/dL (ref 6.0–8.3)

## 2014-07-24 LAB — KAPPA/LAMBDA LIGHT CHAINS: KAPPA FREE LGHT CHN: 1.35 mg/dL (ref 0.33–1.94)

## 2014-08-13 NOTE — Progress Notes (Signed)
This encounter was created in error - please disregard.

## 2014-12-31 ENCOUNTER — Encounter (HOSPITAL_COMMUNITY): Payer: Self-pay | Admitting: Family Medicine

## 2014-12-31 ENCOUNTER — Emergency Department (HOSPITAL_COMMUNITY): Payer: Medicare Other

## 2014-12-31 ENCOUNTER — Emergency Department (HOSPITAL_COMMUNITY)
Admission: EM | Admit: 2014-12-31 | Discharge: 2015-01-01 | Disposition: A | Discharge status: Home / Self Care | Payer: Medicare Other | Attending: Emergency Medicine | Admitting: Emergency Medicine

## 2014-12-31 DIAGNOSIS — Z8739 Personal history of other diseases of the musculoskeletal system and connective tissue: Secondary | ICD-10-CM | POA: Diagnosis not present

## 2014-12-31 DIAGNOSIS — Z7902 Long term (current) use of antithrombotics/antiplatelets: Secondary | ICD-10-CM | POA: Diagnosis not present

## 2014-12-31 DIAGNOSIS — Q6100 Congenital renal cyst, unspecified: Secondary | ICD-10-CM | POA: Diagnosis not present

## 2014-12-31 DIAGNOSIS — Z972 Presence of dental prosthetic device (complete) (partial): Secondary | ICD-10-CM | POA: Diagnosis not present

## 2014-12-31 DIAGNOSIS — Z7901 Long term (current) use of anticoagulants: Secondary | ICD-10-CM | POA: Insufficient documentation

## 2014-12-31 DIAGNOSIS — R0602 Shortness of breath: Secondary | ICD-10-CM | POA: Diagnosis present

## 2014-12-31 DIAGNOSIS — F259 Schizoaffective disorder, unspecified: Secondary | ICD-10-CM | POA: Diagnosis not present

## 2014-12-31 DIAGNOSIS — I272 Other secondary pulmonary hypertension: Secondary | ICD-10-CM | POA: Insufficient documentation

## 2014-12-31 DIAGNOSIS — Z8742 Personal history of other diseases of the female genital tract: Secondary | ICD-10-CM | POA: Insufficient documentation

## 2014-12-31 DIAGNOSIS — F0390 Unspecified dementia without behavioral disturbance: Secondary | ICD-10-CM | POA: Insufficient documentation

## 2014-12-31 DIAGNOSIS — Z79899 Other long term (current) drug therapy: Secondary | ICD-10-CM | POA: Insufficient documentation

## 2014-12-31 DIAGNOSIS — Z862 Personal history of diseases of the blood and blood-forming organs and certain disorders involving the immune mechanism: Secondary | ICD-10-CM | POA: Diagnosis not present

## 2014-12-31 DIAGNOSIS — I509 Heart failure, unspecified: Secondary | ICD-10-CM | POA: Diagnosis not present

## 2014-12-31 DIAGNOSIS — Z86711 Personal history of pulmonary embolism: Secondary | ICD-10-CM | POA: Insufficient documentation

## 2014-12-31 DIAGNOSIS — E039 Hypothyroidism, unspecified: Secondary | ICD-10-CM | POA: Insufficient documentation

## 2014-12-31 DIAGNOSIS — R000 Tachycardia, unspecified: Secondary | ICD-10-CM | POA: Insufficient documentation

## 2014-12-31 DIAGNOSIS — Z9981 Dependence on supplemental oxygen: Secondary | ICD-10-CM | POA: Insufficient documentation

## 2014-12-31 HISTORY — DX: Unspecified dementia, unspecified severity, without behavioral disturbance, psychotic disturbance, mood disturbance, and anxiety: F03.90

## 2014-12-31 LAB — CBC WITH DIFFERENTIAL/PLATELET
BASOS ABS: 0 10*3/uL (ref 0.0–0.1)
BASOS PCT: 0 % (ref 0–1)
Eosinophils Absolute: 0.1 10*3/uL (ref 0.0–0.7)
Eosinophils Relative: 1 % (ref 0–5)
HCT: 35.2 % — ABNORMAL LOW (ref 36.0–46.0)
Hemoglobin: 10.5 g/dL — ABNORMAL LOW (ref 12.0–15.0)
LYMPHS ABS: 1 10*3/uL (ref 0.7–4.0)
LYMPHS PCT: 20 % (ref 12–46)
MCH: 25.8 pg — ABNORMAL LOW (ref 26.0–34.0)
MCHC: 29.8 g/dL — ABNORMAL LOW (ref 30.0–36.0)
MCV: 86.5 fL (ref 78.0–100.0)
MONO ABS: 0.4 10*3/uL (ref 0.1–1.0)
Monocytes Relative: 8 % (ref 3–12)
NEUTROS ABS: 3.5 10*3/uL (ref 1.7–7.7)
Neutrophils Relative %: 71 % (ref 43–77)
PLATELETS: 436 10*3/uL — AB (ref 150–400)
RBC: 4.07 MIL/uL (ref 3.87–5.11)
RDW: 18.4 % — ABNORMAL HIGH (ref 11.5–15.5)
WBC: 4.9 10*3/uL (ref 4.0–10.5)

## 2014-12-31 LAB — BRAIN NATRIURETIC PEPTIDE: B Natriuretic Peptide: 582.1 pg/mL — ABNORMAL HIGH (ref 0.0–100.0)

## 2014-12-31 LAB — BASIC METABOLIC PANEL
Anion gap: 5 (ref 5–15)
BUN: 22 mg/dL — AB (ref 6–20)
CHLORIDE: 96 mmol/L — AB (ref 101–111)
CO2: 33 mmol/L — AB (ref 22–32)
CREATININE: 0.56 mg/dL (ref 0.44–1.00)
Calcium: 8.6 mg/dL — ABNORMAL LOW (ref 8.9–10.3)
GFR calc non Af Amer: >60 mL/min (ref >60)
Glucose, Bld: 110 mg/dL — ABNORMAL HIGH (ref 70–99)
POTASSIUM: 3.6 mmol/L (ref 3.5–5.1)
Sodium: 134 mmol/L — ABNORMAL LOW (ref 135–145)

## 2014-12-31 LAB — TROPONIN I: Troponin I: 0.69 ng/mL (ref <0.031)

## 2014-12-31 MED ORDER — MORPHINE SULFATE 2 MG/ML IJ SOLN
2.0000 mg | Freq: Once | INTRAMUSCULAR | Status: AC
  Administered 2014-12-31: 2 mg via INTRAVENOUS
  Filled 2014-12-31: qty 1

## 2014-12-31 MED ORDER — FUROSEMIDE 10 MG/ML IJ SOLN
80.0000 mg | Freq: Once | INTRAMUSCULAR | Status: AC
  Administered 2014-12-31: 80 mg via INTRAVENOUS
  Filled 2014-12-31: qty 8

## 2014-12-31 MED ORDER — ALBUTEROL SULFATE (2.5 MG/3ML) 0.083% IN NEBU
5.0000 mg | INHALATION_SOLUTION | Freq: Once | RESPIRATORY_TRACT | Status: AC
  Administered 2014-12-31: 5 mg via RESPIRATORY_TRACT
  Filled 2014-12-31: qty 6

## 2014-12-31 MED ORDER — POTASSIUM CHLORIDE 10 MEQ/100ML IV SOLN
10.0000 meq | Freq: Once | INTRAVENOUS | Status: AC
  Administered 2014-12-31: 10 meq via INTRAVENOUS
  Filled 2014-12-31: qty 100

## 2014-12-31 NOTE — ED Notes (Signed)
MD at bedside. Dr. Campos. 

## 2014-12-31 NOTE — ED Notes (Signed)
Call if patient is discharged. Sheryl (pt's daugher) [phone] (home) [phone] Cornelia Copa son in law cell phones) and [phone] (daughter cell phone).

## 2014-12-31 NOTE — Progress Notes (Signed)
Low SpO2, despite being on NRB mask post ventolin 5mg  BD. Placed on 8L O2 via nasal canula with nonrebreather as well. SpO2 82-86. Dr. Venora Maples aware. No new orders at this time.

## 2014-12-31 NOTE — ED Notes (Signed)
Bed: WA02 Expected date: 12/31/14 Expected time: 7:33 PM Means of arrival: Ambulance Comments: Short of breath, low O2 sat

## 2014-12-31 NOTE — ED Notes (Signed)
Nurse will draw labs. 

## 2014-12-31 NOTE — ED Notes (Signed)
Notified RT about placing patient on Bipap.

## 2014-12-31 NOTE — ED Notes (Signed)
Pt resting quietly with eyes closed. Appears to be more calm and less distressed as appose to arrival.

## 2014-12-31 NOTE — ED Notes (Signed)
Patient is from home. Per EMS, patient's O2 level was 51% when her home health aide checked on it at 17:30. Home health aide used 2  at 14L. When fire and rescue reached patient, her oxygen was no infusing and her O2 level was 68%. When EMS arrived, her O2 was low 90's. She was placed on non re-breather at 15L for at O2 sat at 89%. Patient was recently hospitalized at [facility] for mental disorder. While at [facility], pt was sent to [facility] for shortness of breath. Diagnosed with FTT and suggested hospice care. Currently, the family and patient are not using hospice care.

## 2015-01-01 MED ORDER — POTASSIUM CHLORIDE ER 10 MEQ PO TBCR: 10.0000 meq | EXTENDED_RELEASE_TABLET | Freq: Every day | ORAL | Status: AC

## 2015-01-01 NOTE — ED Notes (Signed)
Notified RT about discontinue of Bi-pap.

## 2015-01-01 NOTE — ED Notes (Signed)
Pt resting quietly with eyes closes. Appears in no distress.

## 2015-01-01 NOTE — Progress Notes (Signed)
Removed from BiPAP and placed back on NRB mask per EDP. SpO2 85%. Dr. Venora Maples made aware and he wants left off BiPAP. RN aware.

## 2015-01-01 NOTE — ED Provider Notes (Signed)
CSN: [id number]     Arrival date & time 12/31/14  1957 History   First MD Initiated Contact with Patient 12/31/14 2022     Chief Complaint  Patient presents with  . Shortness of Breath      HPI Patient is brought to the emergency department after the patient was found to be hypoxic and more short of breath today.  Patient has severe advanced pulmonary hypertension and is currently on 10 L nasal cannula at home per the family.  She was recently discharged from [facility] and was recommended by [facility] that she involve hospice care.  She also has a history of congestive heart failure.  Family reports that she has been intermittently wearing her oxygen.  Family reports that her quality of life has significantly suffered over the past several weeks.  She denies active chest pain.  It sounds as though when EMS arrived she was not infusing with oxygen and her oxygen level is 51%.  With nonrebreather 15 L the patient's oxygen rose to 89%.  She still feels short of breath at this time.  No reported recent fever or chills.  No recent productive cough.  No unilateral leg swelling.  Patient does have a history of pulmonary embolism.  Patient is a DO NOT RESUSCITATE.   Past Medical History  Diagnosis Date  . Dyspnea   . Pulmonary embolism   . Breast cyst     right  . Hypothyroidism   . Renal cyst   . Leukocytopenia   . Monoclonal gammopathy   . Osteoarthritis   . Schizoaffective disorder   . Wears dentures   . Pulmonary hypertension   . Dementia    Past Surgical History  Procedure Laterality Date  . Tubal ligation  1970  . Tubal ligation    . Breast surgery      cyst   Family History  Problem Relation Age of Onset  . Cancer Father     PROSTATE  . Atrial fibrillation Mother   . Alzheimer's disease Mother    History  Substance Use Topics  . Smoking status: Never Smoker   . Smokeless tobacco: Never Used  . Alcohol Use: No   OB History    No data available     Review of  Systems  All other systems reviewed and are negative.     Allergies  Olanzapine  Home Medications   Prior to Admission medications   Medication Sig Start Date End Date Taking? Authorizing Provider  acetaminophen (TYLENOL) 325 MG tablet Take 2 tablets (650 mg total) by mouth every 6 (six) hours as needed for mild pain, fever or headache. 07/23/14  Yes Christina P Rama, MD  albuterol (PROVENTIL HFA;VENTOLIN HFA) 108 (90 BASE) MCG/ACT inhaler Inhale 1-2 puffs into the lungs every 6 (six) hours as needed for wheezing or shortness of breath. 07/08/14  Yes April Palumbo, MD  albuterol (PROVENTIL) (5 MG/ML) 0.5% nebulizer solution Inhale 2.5 mg into the lungs 4 (four) times daily as needed. Shortness of breath 12/18/14 12/18/15 Yes Historical Provider, MD  apixaban (ELIQUIS) 2.5 MG TABS tablet Take 2.5 mg by mouth every 12 (twelve) hours.   Yes Historical Provider, MD  Ascorbic Acid (VITAMIN C) 1000 MG tablet Take 1 tablet by mouth daily.   Yes Historical Provider, MD  clonazePAM (KLONOPIN) 0.5 MG tablet Take 0.5 mg by mouth at bedtime.   Yes Historical Provider, MD  clonazePAM (KLONOPIN) 0.5 MG tablet Take 0.25 tablets by mouth 2 (two) times  daily. 12/18/14 01/17/15 Yes Historical Provider, MD  [employer] TUSSIN DM COUGH/CHEST 10-200 MG/5ML LIQD Take 5 mLs by mouth every 4 (four) hours as needed. cough 12/18/14  Yes Historical Provider, MD  furosemide (LASIX) 20 MG tablet Take 20 mg by mouth daily.    Yes Historical Provider, MD  lactose free nutrition (BOOST PLUS) LIQD Take 237 mLs by mouth 2 (two) times daily between meals. Patient taking differently: Take 237 mLs by mouth 2 (two) times daily between meals as needed (nutritional supplement).  07/23/14  Yes Christina P Rama, MD  levothyroxine (SYNTHROID, LEVOTHROID) 125 MCG tablet Take 125 mcg by mouth daily before breakfast.   Yes Historical Provider, MD  Macitentan 10 MG TABS Take 1 tablet by mouth daily.   Yes Historical Provider, MD  Melatonin 3 MG TABS  Take 1 tablet by mouth at bedtime.   Yes Historical Provider, MD  pantoprazole (PROTONIX) 40 MG tablet Take 40 mg by mouth daily.   Yes Historical Provider, MD  polyethylene glycol (MIRALAX / GLYCOLAX) packet Take 17 g by mouth 2 (two) times daily as needed for severe constipation. constipation 12/18/14  Yes Historical Provider, MD  risperiDONE (RISPERDAL) 0.5 MG tablet Take 0.5 mg by mouth every 6 (six) hours as needed. agitation 12/18/14  Yes Historical Provider, MD  risperiDONE microspheres (RISPERDAL CONSTA) 50 MG injection Inject 2 mLs into the muscle every 14 (fourteen) days. Got on 12/28/14 12/18/14 12/18/15 Yes Historical Provider, MD  sennosides-docusate sodium (SENOKOT-S) 8.6-50 MG tablet Take 2 tablets by mouth 2 (two) times daily as needed for constipation.    Yes Historical Provider, MD  traZODone (DESYREL) 50 MG tablet Take 25 mg by mouth at bedtime as needed for sleep.   Yes Historical Provider, MD  vitamin C (ASCORBIC ACID) 500 MG tablet Take 500 mg by mouth 2 (two) times daily.    Yes Historical Provider, MD  divalproex (DEPAKOTE ER) 500 MG 24 hr tablet Take 1 tablet (500 mg total) by mouth 2 (two) times daily. Patient not taking: Reported on 12/31/2014 07/23/14   Venetia Maxon Rama, MD  enoxaparin (LOVENOX) 100 MG/ML injection Inject 0.9 mLs (90 mg total) into the skin daily. Patient not taking: Reported on 12/31/2014 07/23/14   Venetia Maxon Rama, MD  OLANZapine zydis (ZYPREXA) 5 MG disintegrating tablet Take 1 tablet (5 mg total) by mouth at bedtime. Patient not taking: Reported on 12/31/2014 07/23/14   Venetia Maxon Rama, MD   BP 82/50 mmHg  Pulse 65  Temp(Src) 98.9 F (37.2 C) (Oral)  Resp 15  Ht 5\' 2"  (1.575 m)  Wt 125 lb (56.7 kg)  BMI 22.86 kg/m2  SpO2 93% Physical Exam  Constitutional: She is oriented to person, place, and time. She appears well-developed and well-nourished. No distress.  HENT:  Head: Normocephalic and atraumatic.  Eyes: EOM are normal.  Neck: Normal range of  motion.  Cardiovascular: Regular rhythm and normal heart sounds.   Tachycardic  Pulmonary/Chest:  Rales in bases.  Tachypnea.  Abdominal: Soft. She exhibits no distension. There is no tenderness.  Musculoskeletal: Normal range of motion.  Neurological: She is alert and oriented to person, place, and time.  Skin: Skin is warm and dry.  Psychiatric: She has a normal mood and affect. Judgment normal.  Nursing note and vitals reviewed.   ED Course  Procedures (including critical care time) Labs Review Labs Reviewed  CBC WITH DIFFERENTIAL/PLATELET - Abnormal; Notable for the following:    Hemoglobin 10.5 (*)    HCT 35.2 (*)  MCH 25.8 (*)    MCHC 29.8 (*)    RDW 18.4 (*)    Platelets 436 (*)    All other components within normal limits  BASIC METABOLIC PANEL - Abnormal; Notable for the following:    Sodium 134 (*)    Chloride 96 (*)    CO2 33 (*)    Glucose, Bld 110 (*)    BUN 22 (*)    Calcium 8.6 (*)    All other components within normal limits  BRAIN NATRIURETIC PEPTIDE - Abnormal; Notable for the following:    B Natriuretic Peptide 582.1 (*)    All other components within normal limits  TROPONIN I - Abnormal; Notable for the following:    Troponin I 0.69 (*)    All other components within normal limits    Imaging Review Dg Chest Portable 1 View  12/31/2014   CLINICAL DATA:  Low oxygen saturation  EXAM: PORTABLE CHEST - 1 VIEW  COMPARISON:  Radiograph 07/18/2014  FINDINGS: Large cardiac silhouette. There are new bilateral small effusions. Central venous congestion is present. No pneumothorax. No focal infiltrate  IMPRESSION: Central venous congestion and new effusions. Findings suggest congestive heart failure.   Electronically Signed   By: Suzy Bouchard M.D.   On: 12/31/2014 21:38     EKG Interpretation   Date/Time:  Monday Dec 31 2014 20:16:23 EDT Ventricular Rate:  125 PR Interval:  177 QRS Duration: 159 QT Interval:  389 QTC Calculation: 561 R Axis:    18 Text Interpretation:  Sinus tachycardia Multiform ventricular premature  complexes Sinus pause LAE, consider biatrial enlargement Nonspecific  intraventricular conduction delay Artifact in lead(s) I II aVR aVL aVF V1  V2 V3 V4 V5 V6 and baseline wander in lead(s) I III aVL aVF Interpretation  limited secondary to artifact Confirmed by Audreena Sachdeva  MD, Lennette Bihari ([id number]) on  12/31/2014 11:50:04 PM      ECG interpretation   Date: 01/01/2015  Rate: 65  Rhythm: normal sinus rhythm  QRS Axis: normal  Intervals: normal  ST/T Wave abnormalities: Nonspecific ST and T-wave changes in the inferior and anterior leads.  Conduction Disutrbances:   Narrative Interpretation:   Old EKG Reviewed: Mild nonspecific changes as compared to prior EKGs.  Unable to compared to initial EKG secondary to artifact.     MDM   Final diagnoses:  Congestive heart failure, unspecified congestive heart failure chronicity, unspecified congestive heart failure type  Pulmonary hypertension    This a very complex patient with what sounds like very poor quality of life and severe pulmonary hypertension.  Long discussion with the patient's family including the patient's healthcare power of attorney who is an emergency physician based out of [place].  Initially after speaking with the physician son it was thought that we should attempt BiPAP and IV diuresis with observational stay in the hospital and if she were to improve discharge back home as the goal for the patient at this time is focused on comfort and being home with family.  He then called me back after gaining additional information from the family and he stated he would prefer to have the patient returned home this evening after IV diuresis in the emergency department.  He understands that she is requiring 14-15 L nonrebreather at this time.  She was given morphine at a very low dose initially for air hunger which seemed to significantly improve her symptoms.  The son reports  that he will double her Lasix over the next several days  and attempt diuresis at home while in the comfort of her own home with family.  There is a home [employer].  Family is comfortable with this.  I have allowed the son to direct the majority of her care as he seemed to have her best interest in mind and we are able to discuss many of her findings and vital signs here in the emergency department given his profession as emergency physician.  Nonspecific ST and T-wave changes and mild troponin elevation.  Likely supply and demand.  No active chest pain at this time.  Focus of this time is on comfort which I think is reasonable given her advanced pulmonary hypertension.  I've asked the patient and let the patient's family noted that she is welcome to return the emergency department for any new or worsening symptoms.    Jola Schmidt, MD 01/01/15 [phone]

## 2015-01-01 NOTE — ED Notes (Signed)
Notified [facility] for transportation back to personal residents. Also, notified [name], patients daughter, of patient being discharged.

## 2015-01-30 ENCOUNTER — Inpatient Hospital Stay (HOSPITAL_COMMUNITY)
Admission: EM | Admit: 2015-01-30 | Discharge: 2015-02-04 | DRG: 175 | Disposition: A | Discharge status: Hospice | Payer: Medicare Other | Attending: Internal Medicine | Admitting: Internal Medicine

## 2015-01-30 ENCOUNTER — Emergency Department (HOSPITAL_COMMUNITY): Payer: Medicare Other

## 2015-01-30 ENCOUNTER — Encounter (HOSPITAL_COMMUNITY): Payer: Self-pay | Admitting: *Deleted

## 2015-01-30 DIAGNOSIS — F319 Bipolar disorder, unspecified: Secondary | ICD-10-CM | POA: Diagnosis present

## 2015-01-30 DIAGNOSIS — Z7401 Bed confinement status: Secondary | ICD-10-CM

## 2015-01-30 DIAGNOSIS — I509 Heart failure, unspecified: Secondary | ICD-10-CM

## 2015-01-30 DIAGNOSIS — R0902 Hypoxemia: Secondary | ICD-10-CM | POA: Diagnosis present

## 2015-01-30 DIAGNOSIS — Z66 Do not resuscitate: Secondary | ICD-10-CM | POA: Diagnosis present

## 2015-01-30 DIAGNOSIS — I272 Other secondary pulmonary hypertension: Secondary | ICD-10-CM | POA: Diagnosis present

## 2015-01-30 DIAGNOSIS — I2699 Other pulmonary embolism without acute cor pulmonale: Secondary | ICD-10-CM

## 2015-01-30 DIAGNOSIS — R531 Weakness: Secondary | ICD-10-CM | POA: Diagnosis present

## 2015-01-30 DIAGNOSIS — J9601 Acute respiratory failure with hypoxia: Secondary | ICD-10-CM | POA: Diagnosis present

## 2015-01-30 DIAGNOSIS — D472 Monoclonal gammopathy: Secondary | ICD-10-CM | POA: Diagnosis present

## 2015-01-30 DIAGNOSIS — Z7901 Long term (current) use of anticoagulants: Secondary | ICD-10-CM

## 2015-01-30 DIAGNOSIS — Z820 Family history of epilepsy and other diseases of the nervous system: Secondary | ICD-10-CM

## 2015-01-30 DIAGNOSIS — D649 Anemia, unspecified: Secondary | ICD-10-CM | POA: Diagnosis present

## 2015-01-30 DIAGNOSIS — I5032 Chronic diastolic (congestive) heart failure: Secondary | ICD-10-CM | POA: Diagnosis present

## 2015-01-30 DIAGNOSIS — I2782 Chronic pulmonary embolism: Principal | ICD-10-CM | POA: Diagnosis present

## 2015-01-30 DIAGNOSIS — Z9981 Dependence on supplemental oxygen: Secondary | ICD-10-CM

## 2015-01-30 DIAGNOSIS — Z515 Encounter for palliative care: Secondary | ICD-10-CM | POA: Insufficient documentation

## 2015-01-30 DIAGNOSIS — Z9114 Patient's other noncompliance with medication regimen: Secondary | ICD-10-CM | POA: Diagnosis present

## 2015-01-30 DIAGNOSIS — Z809 Family history of malignant neoplasm, unspecified: Secondary | ICD-10-CM

## 2015-01-30 DIAGNOSIS — F0390 Unspecified dementia without behavioral disturbance: Secondary | ICD-10-CM | POA: Diagnosis present

## 2015-01-30 DIAGNOSIS — Z79899 Other long term (current) drug therapy: Secondary | ICD-10-CM

## 2015-01-30 DIAGNOSIS — F259 Schizoaffective disorder, unspecified: Secondary | ICD-10-CM | POA: Diagnosis present

## 2015-01-30 DIAGNOSIS — Z888 Allergy status to other drugs, medicaments and biological substances status: Secondary | ICD-10-CM

## 2015-01-30 DIAGNOSIS — D638 Anemia in other chronic diseases classified elsewhere: Secondary | ICD-10-CM | POA: Diagnosis present

## 2015-01-30 DIAGNOSIS — R0602 Shortness of breath: Secondary | ICD-10-CM | POA: Diagnosis not present

## 2015-01-30 DIAGNOSIS — I2724 Chronic thromboembolic pulmonary hypertension: Secondary | ICD-10-CM | POA: Diagnosis present

## 2015-01-30 DIAGNOSIS — J9621 Acute and chronic respiratory failure with hypoxia: Secondary | ICD-10-CM | POA: Diagnosis present

## 2015-01-30 DIAGNOSIS — E039 Hypothyroidism, unspecified: Secondary | ICD-10-CM | POA: Diagnosis present

## 2015-01-30 DIAGNOSIS — M1990 Unspecified osteoarthritis, unspecified site: Secondary | ICD-10-CM | POA: Diagnosis present

## 2015-01-30 LAB — BRAIN NATRIURETIC PEPTIDE: B Natriuretic Peptide: 745 pg/mL — ABNORMAL HIGH (ref 0.0–100.0)

## 2015-01-30 LAB — CBC WITH DIFFERENTIAL/PLATELET
BASOS PCT: 1 % (ref 0–1)
Basophils Absolute: 0 10*3/uL (ref 0.0–0.1)
Eosinophils Absolute: 0.1 10*3/uL (ref 0.0–0.7)
Eosinophils Relative: 1 % (ref 0–5)
HEMATOCRIT: 36 % (ref 36.0–46.0)
HEMOGLOBIN: 10.4 g/dL — AB (ref 12.0–15.0)
LYMPHS ABS: 1.7 10*3/uL (ref 0.7–4.0)
Lymphocytes Relative: 39 % (ref 12–46)
MCH: 24 pg — AB (ref 26.0–34.0)
MCHC: 28.9 g/dL — AB (ref 30.0–36.0)
MCV: 82.9 fL (ref 78.0–100.0)
MONOS PCT: 8 % (ref 3–12)
Monocytes Absolute: 0.4 10*3/uL (ref 0.1–1.0)
NEUTROS ABS: 2.2 10*3/uL (ref 1.7–7.7)
Neutrophils Relative %: 51 % (ref 43–77)
Platelets: 293 10*3/uL (ref 150–400)
RBC: 4.34 MIL/uL (ref 3.87–5.11)
RDW: 19.9 % — ABNORMAL HIGH (ref 11.5–15.5)
WBC: 4.3 10*3/uL (ref 4.0–10.5)

## 2015-01-30 LAB — BLOOD GAS, ARTERIAL
ACID-BASE EXCESS: 6.3 mmol/L — AB (ref 0.0–2.0)
Bicarbonate: 31.1 mEq/L — ABNORMAL HIGH (ref 20.0–24.0)
Delivery systems: POSITIVE
Drawn by: 308601
EXPIRATORY PAP: 8
FIO2: 1 %
Inspiratory PAP: 12
Mode: POSITIVE
O2 Saturation: 88 %
PCO2 ART: 48.1 mmHg — AB (ref 35.0–45.0)
Patient temperature: 98.6
TCO2: 28.7 mmol/L (ref 0–100)
pH, Arterial: 7.427 (ref 7.350–7.450)
pO2, Arterial: 59.6 mmHg — ABNORMAL LOW (ref 80.0–100.0)

## 2015-01-30 LAB — BASIC METABOLIC PANEL
Anion gap: 7 (ref 5–15)
BUN: 19 mg/dL (ref 6–20)
CALCIUM: 8.3 mg/dL — AB (ref 8.9–10.3)
CO2: 31 mmol/L (ref 22–32)
Chloride: 98 mmol/L — ABNORMAL LOW (ref 101–111)
Creatinine, Ser: 0.68 mg/dL (ref 0.44–1.00)
GFR calc non Af Amer: >60 mL/min (ref >60)
GLUCOSE: 108 mg/dL — AB (ref 65–99)
Potassium: 3.9 mmol/L (ref 3.5–5.1)
Sodium: 136 mmol/L (ref 135–145)

## 2015-01-30 LAB — I-STAT TROPONIN, ED: Troponin i, poc: 0.08 ng/mL (ref 0.00–0.08)

## 2015-01-30 LAB — PROTIME-INR
INR: 1.21 (ref 0.00–1.49)
PROTHROMBIN TIME: 15.4 s — AB (ref 11.6–15.2)

## 2015-01-30 LAB — APTT: aPTT: 27 seconds (ref 24–37)

## 2015-01-30 MED ORDER — LORAZEPAM 2 MG/ML IJ SOLN
0.5000 mg | Freq: Once | INTRAMUSCULAR | Status: AC
  Administered 2015-01-30: 0.5 mg via INTRAVENOUS
  Filled 2015-01-30: qty 1

## 2015-01-30 MED ORDER — IPRATROPIUM-ALBUTEROL 0.5-2.5 (3) MG/3ML IN SOLN
3.0000 mL | Freq: Once | RESPIRATORY_TRACT | Status: AC
  Administered 2015-01-30: 3 mL via RESPIRATORY_TRACT
  Filled 2015-01-30: qty 3

## 2015-01-30 MED ORDER — IOHEXOL 350 MG/ML SOLN: 100.0000 mL | Freq: Once | INTRAVENOUS | Status: AC | PRN

## 2015-01-30 NOTE — ED Notes (Signed)
Bed: WA09 Expected date:  Expected time:  Means of arrival:  Comments: EMS dementia/pain all over

## 2015-01-30 NOTE — ED Provider Notes (Signed)
TIME SEEN: 10:50 PM  CHIEF COMPLAINT: Hypoxia  HPI: Pt is a 74 y.o. female with history of schizoaffective disorder, dementia, pulmonary embolus on [place], pulmonary hypertension, CHF who wears between 10-15 L of oxygen at home who presents to the emergency department with hypoxia. EMS was called out for chest pain and "hurting all over". Found by the fire department to have an oxygen saturation of 55% on room air. Patient's caregiver reports that she refuses to wear her oxygen. [name] denies fever, cough.  Pt denies chest pain currently.  Unable to get clear history from patient. Oxygen saturation 79% on nonrebreather. Patient is talking complete sentences. Does not appear to be in distress.   ROS:  Level V caveat for dementia  PAST MEDICAL HISTORY/PAST SURGICAL HISTORY:  Past Medical History  Diagnosis Date  . Dyspnea   . Pulmonary embolism   . Breast cyst     right  . Hypothyroidism   . Renal cyst   . Leukocytopenia   . Monoclonal gammopathy   . Osteoarthritis   . Schizoaffective disorder   . Wears dentures   . Pulmonary hypertension   . Dementia     MEDICATIONS:  Prior to Admission medications   Medication Sig Start Date End Date Taking? Authorizing Provider  acetaminophen (TYLENOL) 325 MG tablet Take 2 tablets (650 mg total) by mouth every 6 (six) hours as needed for mild pain, fever or headache. 07/23/14   Venetia Maxon Rama, MD  albuterol (PROVENTIL HFA;VENTOLIN HFA) 108 (90 BASE) MCG/ACT inhaler Inhale 1-2 puffs into the lungs every 6 (six) hours as needed for wheezing or shortness of breath. 07/08/14   April Palumbo, MD  albuterol (PROVENTIL) (5 MG/ML) 0.5% nebulizer solution Inhale 2.5 mg into the lungs 4 (four) times daily as needed. Shortness of breath 12/18/14 12/18/15  Historical Provider, MD  apixaban (ELIQUIS) 2.5 MG TABS tablet Take 2.5 mg by mouth every 12 (twelve) hours.    Historical Provider, MD  Ascorbic Acid (VITAMIN C) 1000 MG tablet Take 1 tablet by  mouth daily.    Historical Provider, MD  clonazePAM (KLONOPIN) 0.5 MG tablet Take 0.5 mg by mouth at bedtime.    Historical Provider, MD  clonazePAM (KLONOPIN) 0.5 MG tablet Take 0.25 tablets by mouth 2 (two) times daily. 12/18/14 01/17/15  Historical Provider, MD  [facility] TUSSIN DM COUGH/CHEST 10-200 MG/5ML LIQD Take 5 mLs by mouth every 4 (four) hours as needed. cough 12/18/14   Historical Provider, MD  divalproex (DEPAKOTE ER) 500 MG 24 hr tablet Take 1 tablet (500 mg total) by mouth 2 (two) times daily. Patient not taking: Reported on 12/31/2014 07/23/14   Venetia Maxon Rama, MD  enoxaparin (LOVENOX) 100 MG/ML injection Inject 0.9 mLs (90 mg total) into the skin daily. Patient not taking: Reported on 12/31/2014 07/23/14   Venetia Maxon Rama, MD  furosemide (LASIX) 20 MG tablet Take 20 mg by mouth daily.     Historical Provider, MD  lactose free nutrition (BOOST PLUS) LIQD Take 237 mLs by mouth 2 (two) times daily between meals. Patient taking differently: Take 237 mLs by mouth 2 (two) times daily between meals as needed (nutritional supplement).  07/23/14   Christina P Rama, MD  levothyroxine (SYNTHROID, LEVOTHROID) 125 MCG tablet Take 125 mcg by mouth daily before breakfast.    Historical Provider, MD  Macitentan 10 MG TABS Take 1 tablet by mouth daily.    Historical Provider, MD  Melatonin 3 MG TABS Take 1 tablet by mouth at  bedtime.    Historical Provider, MD  OLANZapine zydis (ZYPREXA) 5 MG disintegrating tablet Take 1 tablet (5 mg total) by mouth at bedtime. Patient not taking: Reported on 12/31/2014 07/23/14   Venetia Maxon Rama, MD  pantoprazole (PROTONIX) 40 MG tablet Take 40 mg by mouth daily.    Historical Provider, MD  polyethylene glycol (MIRALAX / GLYCOLAX) packet Take 17 g by mouth 2 (two) times daily as needed for severe constipation. constipation 12/18/14   Historical Provider, MD  potassium chloride (K-DUR) 10 MEQ tablet Take 1 tablet (10 mEq total) by mouth daily. 01/01/15   Jola Schmidt, MD   risperiDONE (RISPERDAL) 0.5 MG tablet Take 0.5 mg by mouth every 6 (six) hours as needed. agitation 12/18/14   Historical Provider, MD  risperiDONE microspheres (RISPERDAL CONSTA) 50 MG injection Inject 2 mLs into the muscle every 14 (fourteen) days. Got on 12/28/14 12/18/14 12/18/15  Historical Provider, MD  sennosides-docusate sodium (SENOKOT-S) 8.6-50 MG tablet Take 2 tablets by mouth 2 (two) times daily as needed for constipation.     Historical Provider, MD  traZODone (DESYREL) 50 MG tablet Take 25 mg by mouth at bedtime as needed for sleep.    Historical Provider, MD  vitamin C (ASCORBIC ACID) 500 MG tablet Take 500 mg by mouth 2 (two) times daily.     Historical Provider, MD    ALLERGIES:  Allergies  Allergen Reactions  . Olanzapine Other (See Comments)    Agitation, pts family does not recall    SOCIAL HISTORY:  History  Substance Use Topics  . Smoking status: Never Smoker   . Smokeless tobacco: Never Used  . Alcohol Use: No    FAMILY HISTORY: Family History  Problem Relation Age of Onset  . Cancer Father     PROSTATE  . Atrial fibrillation Mother   . Alzheimer's disease Mother     EXAM: BP 105/64 mmHg  Pulse 90  Resp 21  Ht 5' (1.524 m)  Wt 130 lb (58.968 kg)  BMI 25.39 kg/m2  SpO2 93% CONSTITUTIONAL: Alert and oriented to person and time (states it is "16"when asked the year). Elderly, and in no distress HEAD: Normocephalic EYES: Conjunctivae clear, PERRL ENT: normal nose; no rhinorrhea; moist mucous membranes; pharynx without lesions noted NECK: Supple, no meningismus, no LAD  CARD: RRR; S1 and S2 appreciated; no murmurs, no clicks, no rubs, no gallops RESP: Normal chest excursion without splinting; patient is tachypneic; breath sounds equal bilaterally but diminished at her bases, no wheezing or rhonchi or rales, speaking full sentences, patient is significantly hypoxic on nonrebreather ABD/GI: Normal bowel sounds; non-distended; soft, non-tender, no rebound,  no guarding, no peritoneal signs BACK:  The back appears normal and is non-tender to palpation, there is no CVA tenderness EXT: Normal ROM in all joints; non-tender to palpation; bilateral lower extremity nonpitting edema; normal capillary refill; no cyanosis, no calf tenderness or swelling    SKIN: Normal color for age and race; warm NEURO: Moves all extremities equally, sensation to light touch intact diffusely, cranial nerves II through XII intact   MEDICAL DECISION MAKING: Patient here with hypoxia, tachypnea. Has a history of pulmonary hypertension, CHF and PE. I have called patient's 2 daughters who agree that she is a DO NOT RESUSCITATE/DO NOT INTUBATE. Have also attempted to call her son who is an emergency room physician but have not been able to get in touch with him. Family is unable to tell me what they would like to do for the patient  at this time. One of her daughters is on her way to the emergency department. We'll start with labs, ABG, chest x-ray. I feel she will need a CT of her chest. Will place her on BiPAP.  ED PROGRESS: Patient's oxygenation improving on BiPAP. Her ABG shows a low PO2 despite being on nonrebreather. Her labs are unremarkable other than an elevated BNP of 745. Chest x-ray shows improved right base aeration and diminished basilar congestion. There is stable cardiomegaly and left pleural effusion. CT chest pending. Oxygen saturation is now 93% on BiPAP with an FiO2 of 100%.  CT chest shows worsening thromboembolic burden in the right lung than both the lower and middle lobes. Also patient appears to have elevated right heart pressures with dilation of the right atrium and ventricle out of proportion to the thromboembolic burden and likely secondary to heart failure, pulmonary hypertension. No infiltrate appreciated. Discussed with patient's daughter at bedside who at this time wants treatment for the patient. They still agree that she is a DO NOT RESUSCITATE/DO NOT  INTUBATE. They do not think that they would want invasive treatment such as catheter directed thrombolytics, central lines. It is very difficult however to get the patient and daughter to agree on plan of care. Now that her oxygen level is better patient is able to answer more questions and is very agitated, yelling, cussing. Patient's daughter reports that she has talked to her sister who is the patient's prior returning in they would like her admitted. We'll give Lasix and start heparin drip. Discussed with hospitalist for admission to step down.   Patient repeatedly removing her BiPAP mask. Have discussed with patient and daughter multiple times at length that if they want full treatment for her symptoms she needs to wear the BiPAP. Placed her back on a nonrebreather which she agrees to it her sats are in the upper 80s. Given this is close to her baseline I feel she is stable to go to a stepdown bed. Have encouraged her to wear BiPAP. I have strongly recommended hospice care for patient and family but patient states "I am not ready to die".    EKG Interpretation  Date/Time:  Wednesday January 30 2015 23:14:42 EDT Ventricular Rate:  94 PR Interval:  150 QRS Duration: 91 QT Interval:  376 QTC Calculation: 470 R Axis:   174 Text Interpretation:  Sinus rhythm Probable left atrial enlargement Probable RVH w/ secondary repol abnormality No significant change since last tracing Confirmed by Mikella Linsley,  DO, Lithzy Bernard ([id number]) on 01/30/2015 11:56:22 PM        CRITICAL CARE Performed by: Nyra Jabs   Total critical care time: 60 minutes  Critical care time was exclusive of separately billable procedures and treating other patients.  Critical care was necessary to treat or prevent imminent or life-threatening deterioration.  Critical care was time spent personally by me on the following activities: development of treatment plan with patient and/or surrogate as well as nursing, discussions with  consultants, evaluation of patient's response to treatment, examination of patient, obtaining history from patient or surrogate, ordering and performing treatments and interventions, ordering and review of laboratory studies, ordering and review of radiographic studies, pulse oximetry and re-evaluation of patient's condition.    Thomson, DO 01/31/15 [phone]

## 2015-01-30 NOTE — ED Notes (Signed)
Notified EDP,Ward,MD., pt. i-stat troponin results 0.08.RN,Autumn made aware.

## 2015-01-30 NOTE — ED Notes (Signed)
Per [facility] - pt from home, pt w/ hx of dementia and is a poor historian, pt's caregiver also a limited historian. Pt reported chest pain this evening, when questioned pt admits to "hurting all over" - pt found by fire dept to have SPO2 of 55% on RA, pt normally wears oxygen at home however caregiver states she has been refusing to wear it. Pt 79% on NRBM on arrival to department, Dr. Colvin Caroli and Dr. Leonides Schanz at bedside on arrival. Pt speaking complete sentences and laughing out loud.

## 2015-01-31 ENCOUNTER — Encounter (HOSPITAL_COMMUNITY): Payer: Self-pay

## 2015-01-31 ENCOUNTER — Inpatient Hospital Stay (HOSPITAL_COMMUNITY): Payer: Medicare Other

## 2015-01-31 DIAGNOSIS — M1990 Unspecified osteoarthritis, unspecified site: Secondary | ICD-10-CM | POA: Diagnosis present

## 2015-01-31 DIAGNOSIS — Z820 Family history of epilepsy and other diseases of the nervous system: Secondary | ICD-10-CM | POA: Diagnosis not present

## 2015-01-31 DIAGNOSIS — I5032 Chronic diastolic (congestive) heart failure: Secondary | ICD-10-CM | POA: Diagnosis present

## 2015-01-31 DIAGNOSIS — Z7901 Long term (current) use of anticoagulants: Secondary | ICD-10-CM | POA: Diagnosis not present

## 2015-01-31 DIAGNOSIS — Z515 Encounter for palliative care: Secondary | ICD-10-CM | POA: Diagnosis not present

## 2015-01-31 DIAGNOSIS — D472 Monoclonal gammopathy: Secondary | ICD-10-CM | POA: Diagnosis present

## 2015-01-31 DIAGNOSIS — I272 Other secondary pulmonary hypertension: Secondary | ICD-10-CM

## 2015-01-31 DIAGNOSIS — I2782 Chronic pulmonary embolism: Secondary | ICD-10-CM | POA: Diagnosis present

## 2015-01-31 DIAGNOSIS — F319 Bipolar disorder, unspecified: Secondary | ICD-10-CM | POA: Diagnosis present

## 2015-01-31 DIAGNOSIS — I2699 Other pulmonary embolism without acute cor pulmonale: Secondary | ICD-10-CM

## 2015-01-31 DIAGNOSIS — R609 Edema, unspecified: Secondary | ICD-10-CM

## 2015-01-31 DIAGNOSIS — J9601 Acute respiratory failure with hypoxia: Secondary | ICD-10-CM | POA: Diagnosis not present

## 2015-01-31 DIAGNOSIS — R531 Weakness: Secondary | ICD-10-CM | POA: Diagnosis present

## 2015-01-31 DIAGNOSIS — Z9114 Patient's other noncompliance with medication regimen: Secondary | ICD-10-CM | POA: Diagnosis present

## 2015-01-31 DIAGNOSIS — Z79899 Other long term (current) drug therapy: Secondary | ICD-10-CM | POA: Diagnosis not present

## 2015-01-31 DIAGNOSIS — Z809 Family history of malignant neoplasm, unspecified: Secondary | ICD-10-CM | POA: Diagnosis not present

## 2015-01-31 DIAGNOSIS — R0602 Shortness of breath: Secondary | ICD-10-CM | POA: Diagnosis present

## 2015-01-31 DIAGNOSIS — D638 Anemia in other chronic diseases classified elsewhere: Secondary | ICD-10-CM | POA: Diagnosis present

## 2015-01-31 DIAGNOSIS — F0390 Unspecified dementia without behavioral disturbance: Secondary | ICD-10-CM | POA: Diagnosis present

## 2015-01-31 DIAGNOSIS — Z9981 Dependence on supplemental oxygen: Secondary | ICD-10-CM | POA: Diagnosis not present

## 2015-01-31 DIAGNOSIS — F259 Schizoaffective disorder, unspecified: Secondary | ICD-10-CM | POA: Diagnosis present

## 2015-01-31 DIAGNOSIS — E039 Hypothyroidism, unspecified: Secondary | ICD-10-CM

## 2015-01-31 DIAGNOSIS — D649 Anemia, unspecified: Secondary | ICD-10-CM | POA: Diagnosis present

## 2015-01-31 DIAGNOSIS — Z66 Do not resuscitate: Secondary | ICD-10-CM | POA: Diagnosis present

## 2015-01-31 DIAGNOSIS — Z7401 Bed confinement status: Secondary | ICD-10-CM | POA: Diagnosis not present

## 2015-01-31 DIAGNOSIS — J9621 Acute and chronic respiratory failure with hypoxia: Secondary | ICD-10-CM | POA: Diagnosis present

## 2015-01-31 DIAGNOSIS — Z888 Allergy status to other drugs, medicaments and biological substances status: Secondary | ICD-10-CM | POA: Diagnosis not present

## 2015-01-31 LAB — MRSA PCR SCREENING: MRSA by PCR: NEGATIVE

## 2015-01-31 LAB — HEPARIN LEVEL (UNFRACTIONATED): Heparin Unfractionated: 1.1 IU/mL — ABNORMAL HIGH (ref 0.30–0.70)

## 2015-01-31 LAB — TROPONIN I
TROPONIN I: 0.29 ng/mL — AB (ref <0.031)
TROPONIN I: 0.33 ng/mL — AB (ref <0.031)
Troponin I: 0.13 ng/mL — ABNORMAL HIGH (ref <0.031)

## 2015-01-31 LAB — COMPREHENSIVE METABOLIC PANEL
ALT: 10 U/L — ABNORMAL LOW (ref 14–54)
ANION GAP: 9 (ref 5–15)
AST: 24 U/L (ref 15–41)
Albumin: 2.6 g/dL — ABNORMAL LOW (ref 3.5–5.0)
Alkaline Phosphatase: 45 U/L (ref 38–126)
BUN: 19 mg/dL (ref 6–20)
CALCIUM: 8.6 mg/dL — AB (ref 8.9–10.3)
CO2: 32 mmol/L (ref 22–32)
CREATININE: 0.59 mg/dL (ref 0.44–1.00)
Chloride: 95 mmol/L — ABNORMAL LOW (ref 101–111)
GFR calc non Af Amer: >60 mL/min (ref >60)
GLUCOSE: 94 mg/dL (ref 65–99)
POTASSIUM: 3.7 mmol/L (ref 3.5–5.1)
Sodium: 136 mmol/L (ref 135–145)
TOTAL PROTEIN: 6.8 g/dL (ref 6.5–8.1)
Total Bilirubin: 0.4 mg/dL (ref 0.3–1.2)

## 2015-01-31 MED ORDER — ONDANSETRON HCL 4 MG/2ML IJ SOLN: 4.0000 mg | Freq: Four times a day (QID) | INTRAMUSCULAR | Status: DC | PRN

## 2015-01-31 MED ORDER — HEPARIN (PORCINE) IN NACL 100-0.45 UNIT/ML-% IJ SOLN
1000.0000 [IU]/h | INTRAMUSCULAR | Status: DC
  Administered 2015-01-31: 1000 [IU]/h via INTRAVENOUS
  Filled 2015-01-31: qty 250

## 2015-01-31 MED ORDER — MORPHINE SULFATE 2 MG/ML IJ SOLN
INTRAMUSCULAR | Status: AC
  Administered 2015-01-31: 1 mg via INTRAVENOUS
  Filled 2015-01-31: qty 1

## 2015-01-31 MED ORDER — HYDROMORPHONE HCL 1 MG/ML IJ SOLN: 1.0000 mg | INTRAMUSCULAR | Status: DC | PRN

## 2015-01-31 MED ORDER — MORPHINE SULFATE 2 MG/ML IJ SOLN
1.0000 mg | INTRAMUSCULAR | Status: DC | PRN
  Administered 2015-01-31: 1 mg via INTRAVENOUS

## 2015-01-31 MED ORDER — RISPERIDONE 0.5 MG PO TABS
0.5000 mg | ORAL_TABLET | Freq: Four times a day (QID) | ORAL | Status: DC | PRN
  Administered 2015-01-31 – 2015-02-03 (×4): 0.5 mg via ORAL
  Filled 2015-01-31 (×8): qty 1

## 2015-01-31 MED ORDER — CHLORHEXIDINE GLUCONATE 0.12 % MT SOLN
15.0000 mL | Freq: Two times a day (BID) | OROMUCOSAL | Status: DC
  Administered 2015-01-31 – 2015-02-04 (×8): 15 mL via OROMUCOSAL
  Filled 2015-01-31 (×12): qty 15

## 2015-01-31 MED ORDER — RISPERIDONE MICROSPHERES 50 MG IM SUSR: 50.0000 mg | INTRAMUSCULAR | Status: DC

## 2015-01-31 MED ORDER — FENTANYL CITRATE (PF) 100 MCG/2ML IJ SOLN
INTRAMUSCULAR | Status: AC
  Filled 2015-01-31: qty 2

## 2015-01-31 MED ORDER — ALBUTEROL SULFATE (2.5 MG/3ML) 0.083% IN NEBU: 2.5000 mg | INHALATION_SOLUTION | Freq: Four times a day (QID) | RESPIRATORY_TRACT | Status: DC | PRN

## 2015-01-31 MED ORDER — PANTOPRAZOLE SODIUM 40 MG PO TBEC
40.0000 mg | DELAYED_RELEASE_TABLET | Freq: Every day | ORAL | Status: DC
  Administered 2015-01-31 – 2015-02-04 (×5): 40 mg via ORAL
  Filled 2015-01-31 (×7): qty 1

## 2015-01-31 MED ORDER — LORAZEPAM 2 MG/ML IJ SOLN
INTRAMUSCULAR | Status: AC
  Filled 2015-01-31: qty 1

## 2015-01-31 MED ORDER — FUROSEMIDE 10 MG/ML IJ SOLN
40.0000 mg | Freq: Once | INTRAMUSCULAR | Status: AC
  Administered 2015-01-31: 40 mg via INTRAVENOUS
  Filled 2015-01-31: qty 4

## 2015-01-31 MED ORDER — FUROSEMIDE 20 MG PO TABS
20.0000 mg | ORAL_TABLET | Freq: Every day | ORAL | Status: DC
  Administered 2015-01-31 – 2015-02-04 (×4): 20 mg via ORAL
  Filled 2015-01-31 (×5): qty 1

## 2015-01-31 MED ORDER — SODIUM CHLORIDE 0.9 % IJ SOLN
3.0000 mL | Freq: Two times a day (BID) | INTRAMUSCULAR | Status: DC
  Administered 2015-01-31 – 2015-02-04 (×8): 3 mL via INTRAVENOUS

## 2015-01-31 MED ORDER — ONDANSETRON HCL 4 MG PO TABS: 4.0000 mg | ORAL_TABLET | Freq: Four times a day (QID) | ORAL | Status: DC | PRN

## 2015-01-31 MED ORDER — IOHEXOL 350 MG/ML SOLN
100.0000 mL | Freq: Once | INTRAVENOUS | Status: AC | PRN
  Administered 2015-01-31: 100 mL via INTRAVENOUS

## 2015-01-31 MED ORDER — SODIUM CHLORIDE 0.9 % IV BOLUS (SEPSIS)
250.0000 mL | Freq: Once | INTRAVENOUS | Status: AC
  Administered 2015-01-31: 250 mL via INTRAVENOUS

## 2015-01-31 MED ORDER — LEVOTHYROXINE SODIUM 125 MCG PO TABS
125.0000 ug | ORAL_TABLET | Freq: Every day | ORAL | Status: DC
  Administered 2015-01-31 – 2015-02-04 (×5): 125 ug via ORAL
  Filled 2015-01-31 (×9): qty 1

## 2015-01-31 MED ORDER — POLYETHYLENE GLYCOL 3350 17 G PO PACK: 17.0000 g | PACK | Freq: Two times a day (BID) | ORAL | Status: DC | PRN

## 2015-01-31 MED ORDER — MELATONIN 3 MG PO TABS: 1.0000 | ORAL_TABLET | Freq: Every day | ORAL | Status: DC

## 2015-01-31 MED ORDER — HEPARIN BOLUS VIA INFUSION
1500.0000 [IU] | Freq: Once | INTRAVENOUS | Status: AC
  Administered 2015-01-31: 1500 [IU] via INTRAVENOUS
  Filled 2015-01-31: qty 1500

## 2015-01-31 MED ORDER — RIVAROXABAN 20 MG PO TABS
20.0000 mg | ORAL_TABLET | Freq: Every day | ORAL | Status: DC
  Filled 2015-01-31: qty 1

## 2015-01-31 MED ORDER — BOOST PLUS PO LIQD
237.0000 mL | Freq: Two times a day (BID) | ORAL | Status: DC | PRN
  Filled 2015-01-31: qty 237

## 2015-01-31 MED ORDER — MACITENTAN 10 MG PO TABS
10.0000 mg | ORAL_TABLET | Freq: Every day | ORAL | Status: DC
  Administered 2015-01-31 – 2015-02-04 (×5): 10 mg via ORAL

## 2015-01-31 MED ORDER — HEPARIN (PORCINE) IN NACL 100-0.45 UNIT/ML-% IJ SOLN
800.0000 [IU]/h | INTRAMUSCULAR | Status: AC
  Filled 2015-01-31: qty 250

## 2015-01-31 MED ORDER — LORAZEPAM 2 MG/ML IJ SOLN
0.5000 mg | Freq: Four times a day (QID) | INTRAMUSCULAR | Status: DC | PRN
  Administered 2015-01-31 – 2015-02-01 (×4): 0.5 mg via INTRAVENOUS
  Filled 2015-01-31 (×3): qty 1

## 2015-01-31 MED ORDER — ACETAMINOPHEN 325 MG PO TABS
650.0000 mg | ORAL_TABLET | Freq: Four times a day (QID) | ORAL | Status: DC | PRN
  Administered 2015-02-02: 650 mg via ORAL
  Filled 2015-01-31: qty 2

## 2015-01-31 MED ORDER — CETYLPYRIDINIUM CHLORIDE 0.05 % MT LIQD
7.0000 mL | Freq: Two times a day (BID) | OROMUCOSAL | Status: DC
  Administered 2015-01-31 – 2015-02-04 (×8): 7 mL via OROMUCOSAL

## 2015-01-31 MED ORDER — TRAZODONE HCL 50 MG PO TABS
25.0000 mg | ORAL_TABLET | Freq: Every evening | ORAL | Status: DC | PRN
  Administered 2015-02-01: 25 mg via ORAL
  Filled 2015-01-31: qty 1

## 2015-01-31 MED ORDER — CLONAZEPAM 0.5 MG PO TABS
0.5000 mg | ORAL_TABLET | Freq: Every day | ORAL | Status: DC
  Administered 2015-01-31 – 2015-02-03 (×4): 0.5 mg via ORAL
  Filled 2015-01-31 (×5): qty 1

## 2015-01-31 MED ORDER — ACETAMINOPHEN 650 MG RE SUPP: 650.0000 mg | Freq: Four times a day (QID) | RECTAL | Status: DC | PRN

## 2015-01-31 MED ORDER — CLONAZEPAM 0.125 MG PO TBDP
0.1250 mg | ORAL_TABLET | Freq: Two times a day (BID) | ORAL | Status: DC
Start: 2015-01-31 — End: 2015-02-03
  Administered 2015-01-31 – 2015-02-03 (×6): 0.125 mg via ORAL
  Filled 2015-01-31 (×6): qty 1

## 2015-01-31 MED ORDER — RIVAROXABAN 15 MG PO TABS
15.0000 mg | ORAL_TABLET | Freq: Two times a day (BID) | ORAL | Status: DC
  Administered 2015-01-31 – 2015-02-04 (×8): 15 mg via ORAL
  Filled 2015-01-31 (×11): qty 1

## 2015-01-31 MED ORDER — SENNOSIDES-DOCUSATE SODIUM 8.6-50 MG PO TABS: 2.0000 | ORAL_TABLET | Freq: Two times a day (BID) | ORAL | Status: DC | PRN

## 2015-01-31 MED ORDER — FENTANYL CITRATE (PF) 100 MCG/2ML IJ SOLN
25.0000 ug | INTRAMUSCULAR | Status: DC | PRN
  Administered 2015-01-31 – 2015-02-01 (×8): 25 ug via INTRAVENOUS
  Filled 2015-01-31 (×7): qty 2

## 2015-01-31 NOTE — Progress Notes (Signed)
ANTICOAGULATION CONSULT NOTE - Follow-Up Consult  Pharmacy Consult for IV Heparin --> Xarelto Indication: pulmonary embolus  Allergies  Allergen Reactions  . Olanzapine Other (See Comments)    Agitation, pts family does not recall    Patient Measurements: Height: 5' (152.4 cm) Weight: 156 lb 12 oz (71.1 kg) IBW/kg (Calculated) : 45.5 Heparin Dosing Weight: 56 kg  Vital Signs: Temp: 98.9 F (37.2 C) (06/02 1200) Temp Source: Axillary (06/02 1200) BP: 95/70 mmHg (06/02 1200) Pulse Rate: 78 (06/02 1100)  Labs:  Recent Labs  01/30/15 2305 01/31/15 0540 01/31/15 1150  HGB 10.4*  --   --   HCT 36.0  --   --   PLT 293  --   --   APTT 27  --   --   LABPROT 15.4*  --   --   INR 1.21  --   --   HEPARINUNFRC  --   --  1.10*  CREATININE 0.68 0.59  --   TROPONINI  --  0.13* 0.29*    Estimated Creatinine Clearance: 55.9 mL/min (by C-G formula based on Cr of 0.59).   Medical History: Past Medical History  Diagnosis Date  . Dyspnea   . Pulmonary embolism   . Breast cyst     right  . Hypothyroidism   . Renal cyst   . Leukocytopenia   . Monoclonal gammopathy   . Osteoarthritis   . Schizoaffective disorder   . Wears dentures   . Pulmonary hypertension   . Dementia     Assessment: 81 yoF with PMH of chronic PE on Apixaban, pulmonary HTN, schizoaffective disorder, chronic anemia who presented to the ED with increasing SOB and chest pain. CTa of chest shows worsening thromboembolism. Of note, per daughter, patient had not taken a dose of Apixaban since sometime last weekend PTA. Question of noncompliance vs. treatment failure with Apixaban. Pharmacy consulted to dose IV heparin for + PE, now to transition to Xarelto.   1st heparin level = 1.1, supratherapeutic on 1000 units/hr  SCr 0.59, CrCl > 30 mL/min  Baseline CBC stable  No bleeding issues reported  Goal of Therapy:  Heparin level 0.3-0.7 units/ml Monitor platelets by anticoagulation protocol: Yes   Plan:    Hold heparin drip x 1 hour (done at 1330).  Resume heparin drip at 800 units/hr at 1430.  D/C heparin drip at 1700.  Start Xarelto at 1700 at 15 mg PO BID with meals x 21 days, then 20 mg PO daily with food.  Pharmacy to provide education to patient regarding Xarelto prior to discharge.    Lindell Spar, PharmD, BCPS Pager: [phone] 01/31/2015 2:26 PM

## 2015-01-31 NOTE — Progress Notes (Signed)
2nd troponin .26 - Dr Candiss Norse notifed by text. Pt has no symptoms

## 2015-01-31 NOTE — Progress Notes (Signed)
Pt transported to ICU on NRB due to Pt refusing to wear BIPAP.  Pt vitals are WNL at this time, RT to monitor and assess as needed.

## 2015-01-31 NOTE — H&P (Signed)
[facility] History and Physical  Michaela Horton GMW:[id number] DOB: Apr 13, 1941 DOA: 01/30/2015  Referring physician: Dr.Ward. PCP: Gerrit Heck, MD  Specialists: None.  Chief Complaint: Shortness of breath and chest pain.  HPI: Michaela Horton is a 74 y.o. 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 Patient's symptoms started having last night around 8 PM. Patient lives at home alone with home health aides. As per the daughter and son patient was recently in [facility] for syncope 6 weeks ago and was in the hospital for 2 weeks. At that time patient was placed on 2 medication for pulmonary hypertension following which 1 was discontinued as patient was not ambulatory. In the ER CT angiogram of the chest shows worsening thromboembolism with pulmonary hypertension. Patient was on Apixaban and at this time ER physician and start patient on heparin infusion and was given Lasix 40 mg IV 1 dose. Patient had required to be on BiPAP to maintain saturation. As per patient's daughter patient is sent home on 100% oxygen. Patient is usually bed bound with difficulty moving lower extremities. Patient otherwise denies any nausea vomiting abdominal pain diarrhea fever chills or productive cough. Cardiac markers were negative. Patient has been placed on BiPAP to maintain sats.  Review of Systems: As presented in the history of presenting illness, rest negative.  Past Medical History  Diagnosis Date  . Dyspnea   . Pulmonary embolism   . Breast cyst     right  . Hypothyroidism   . Renal cyst   . Leukocytopenia   . Monoclonal gammopathy   . Osteoarthritis   . Schizoaffective disorder   . Wears dentures   . Pulmonary hypertension   . Dementia    Past Surgical History   Procedure Laterality Date  . Tubal ligation  1970  . Tubal ligation    . Breast surgery      cyst   Social History:  reports that she has never smoked. She has never used smokeless tobacco. She reports that she does not drink alcohol or use illicit drugs. Where does patient live home. Can patient participate in ADLs? No.  Allergies  Allergen Reactions  . Olanzapine Other (See Comments)    Agitation, pts family does not recall    Family History:  Family History  Problem Relation Age of Onset  . Cancer Father     PROSTATE  . Atrial fibrillation Mother   . Alzheimer's disease Mother       Prior to Admission medications   Medication Sig Start Date End Date Taking? Authorizing Provider  acetaminophen (TYLENOL) 325 MG tablet Take 2 tablets (650 mg total) by mouth every 6 (six) hours as needed for mild pain, fever or headache. 07/23/14  Yes Michaela P Rama, MD  albuterol (PROVENTIL HFA;VENTOLIN HFA) 108 (90 BASE) MCG/ACT inhaler Inhale 1-2 puffs into the lungs every 6 (six) hours as needed for wheezing or shortness of breath. 07/08/14  Yes April Palumbo, MD  albuterol (PROVENTIL) (5 MG/ML) 0.5% nebulizer solution Inhale 2.5 mg into the lungs 4 (four) times daily as needed. Shortness of breath 12/18/14 12/18/15 Yes Historical Provider, MD  apixaban (ELIQUIS) 2.5 MG TABS tablet Take 2.5 mg by mouth every 12 (twelve) hours.   Yes Historical Provider, MD  clonazePAM (KLONOPIN) 0.5 MG tablet Take 0.5 mg by mouth at bedtime.   Yes Historical Provider, MD  clonazePAM (KLONOPIN) 0.5 MG tablet Take 0.25 tablets by mouth 2 (two) times daily. 12/18/14 01/30/15 Yes Historical Provider, MD  [facility] TUSSIN DM COUGH/CHEST 10-200 MG/5ML LIQD Take 5 mLs by mouth every 4 (four) hours as needed. cough 12/18/14  Yes Historical Provider, MD  furosemide (LASIX) 20 MG tablet Take 20 mg by mouth daily.    Yes Historical Provider, MD  lactose free nutrition (BOOST PLUS) LIQD Take 237 mLs by mouth 2 (two) times daily  between meals. Patient taking differently: Take 237 mLs by mouth 2 (two) times daily between meals as needed (nutritional supplement).  07/23/14  Yes Michaela P Rama, MD  levothyroxine (SYNTHROID, LEVOTHROID) 125 MCG tablet Take 125 mcg by mouth daily before breakfast.   Yes Historical Provider, MD  Macitentan 10 MG TABS Take 1 tablet by mouth daily.   Yes Historical Provider, MD  Melatonin 3 MG TABS Take 1 tablet by mouth at bedtime.   Yes Historical Provider, MD  pantoprazole (PROTONIX) 40 MG tablet Take 40 mg by mouth daily.   Yes Historical Provider, MD  polyethylene glycol (MIRALAX / GLYCOLAX) packet Take 17 g by mouth 2 (two) times daily as needed for severe constipation. constipation 12/18/14  Yes Historical Provider, MD  risperiDONE (RISPERDAL) 0.5 MG tablet Take 0.5 mg by mouth every 6 (six) hours as needed. agitation 12/18/14  Yes Historical Provider, MD  risperiDONE microspheres (RISPERDAL CONSTA) 50 MG injection Inject 2 mLs into the muscle every 14 (fourteen) days. Got on 12/28/14 12/18/14 12/18/15 Yes Historical Provider, MD  sennosides-docusate sodium (SENOKOT-S) 8.6-50 MG tablet Take 2 tablets by mouth 2 (two) times daily as needed for constipation.    Yes Historical Provider, MD  traZODone (DESYREL) 50 MG tablet Take 25 mg by mouth at bedtime as needed for sleep.   Yes Historical Provider, MD  divalproex (DEPAKOTE ER) 500 MG 24 hr tablet Take 1 tablet (500 mg total) by mouth 2 (two) times daily. Patient not taking: Reported on 12/31/2014 07/23/14   Venetia Maxon Rama, MD  enoxaparin (LOVENOX) 100 MG/ML injection Inject 0.9 mLs (90 mg total) into the skin daily. Patient not taking: Reported on 12/31/2014 07/23/14   Venetia Maxon Rama, MD  OLANZapine zydis (ZYPREXA) 5 MG disintegrating tablet Take 1 tablet (5 mg total) by mouth at bedtime. Patient not taking: Reported on 12/31/2014 07/23/14   Venetia Maxon Rama, MD  potassium chloride (K-DUR) 10 MEQ tablet Take 1 tablet (10 mEq total) by mouth  daily. Patient not taking: Reported on 01/30/2015 01/01/15   Jola Schmidt, MD    Physical Exam: Filed Vitals:   01/31/15 0211 01/31/15 0215 01/31/15 0230 01/31/15 0315  BP:  99/64 105/64 87/60  Pulse:  91 90 82  Temp:    98.2 F (36.8 C)  TempSrc:    Oral  Resp:  24 21 24   Height: 5' (1.524 m)   5' (1.524 m)  Weight: 58.968 kg (130 lb)   71.1 kg (156 lb 12 oz)  SpO2:    95%     General:  Moderately built and nourished.  Eyes: Anicteric no pallor.  ENT: No discharge from the ears eyes nose and mouth.  Neck: No mass felt.  Cardiovascular: S1-S2 heard.  Respiratory: No rhonchi or crepitations.  Abdomen: Soft nontender bowel sounds present.  Skin: No rash.  Musculoskeletal: Bilateral lower extremity edema.  Psychiatric: Appears normal.  Neurologic: Alert awake oriented to time place and person. Patient has difficulty moving lower extremities.  Labs on Admission:  Basic  Metabolic Panel:  Recent Labs Lab 01/30/15 2305  NA 136  K 3.9  CL 98*  CO2 31  GLUCOSE 108*  BUN 19  CREATININE 0.68  CALCIUM 8.3*   Liver Function Tests: No results for input(s): AST, ALT, ALKPHOS, BILITOT, PROT, ALBUMIN in the last 168 hours. No results for input(s): LIPASE, AMYLASE in the last 168 hours. No results for input(s): AMMONIA in the last 168 hours. CBC:  Recent Labs Lab 01/30/15 2305  WBC 4.3  NEUTROABS 2.2  HGB 10.4*  HCT 36.0  MCV 82.9  PLT 293   Cardiac Enzymes: No results for input(s): CKTOTAL, CKMB, CKMBINDEX, TROPONINI in the last 168 hours.  BNP (last 3 results)  Recent Labs  12/31/14 2220 01/30/15 2305  BNP 582.1* 745.0*    ProBNP (last 3 results)  Recent Labs  07/07/14 2158 07/18/14 1246  PROBNP 5315.0* 6961.0*    CBG: No results for input(s): GLUCAP in the last 168 hours.  Radiological Exams on Admission: Ct Angio Chest Pe W/cm &/or Wo Cm  01/31/2015   CLINICAL DATA:  Hypoxia.  Chest pain.  History of pulmonary embolus.  EXAM: CT  ANGIOGRAPHY CHEST WITH CONTRAST  TECHNIQUE: Multidetector CT imaging of the chest was performed using the standard protocol during bolus administration of intravenous contrast. Multiplanar CT image reconstructions and MIPs were obtained to evaluate the vascular anatomy.  CONTRAST:  141mL OMNIPAQUE IOHEXOL 350 MG/ML SOLN  COMPARISON:  Radiographs earlier this day, chest CT 07/08/2014  FINDINGS: Worsening thrombotic burden in the right lower lobe posterior basal segment compared to prior exam. There is new nonocclusive thrombus in the medial right middle lobe segmental branch.  The heart remains enlarged. There is right heart strain with dilatation of the right atrium and ventricle with contrast refluxing into the IVC. Thoracic aorta is normal in caliber. Small right infrahilar lymph node, no mediastinal adenopathy. There is no pericardial effusion.  Small bilateral pleural effusions with adjacent compressive atelectasis of both lower lobes. There is whole body wall edema.  Linear atelectasis or scarring in the right lower lobe and lingula. Tiny subpleural nodularities in the right upper lobe is unchanged.  Cyst in the right lobe of the liver is partially included. There is elevation of left hemidiaphragm that appears unchanged. There are no acute or suspicious osseous abnormalities. Unchanged sclerotic density within T3 vertebral body.  Review of the MIP images confirms the above findings.  IMPRESSION: 1. Worsening thromboembolic burden in the right lung to the lower lobe. New nonocclusive thrombus in the right middle lobe. 2. Elevated right heart pressures with marked dilatation of the right atrium and ventricle and contrast refluxing into the IVC. The degree of right heart dilatation is out of proportion to the thromboembolic burden, and suspect heart failure rather than occlusive thromboembolic disease. There are small bilateral pleural effusions and adjacent compressive atelectasis in the lower lobes. Critical  Value/emergent results were called by telephone at the time of interpretation on 01/31/2015 at 1:11 am to Dr. Pryor Curia , who verbally acknowledged these results.   Electronically Signed   By: Jeb Levering M.D.   On: 01/31/2015 01:14   Dg Chest Portable 1 View  01/31/2015   CLINICAL DATA:  74 year old female with hypoxia.  Chest pain.  EXAM: PORTABLE CHEST - 1 VIEW  COMPARISON:  12/31/2014  FINDINGS: The heart is enlarged. Diminished vascular congestion from prior. Unchanged left pleural effusion, and diminished right pleural effusion. Unchanged left base opacity, likely atelectasis. No pneumothorax. Chronic change about  the right shoulder.  IMPRESSION: Improved right base aeration in diminished vascular congestion. Stable cardiomegaly and left pleural effusion.   Electronically Signed   By: Jeb Levering M.D.   On: 01/31/2015 00:19    EKG: Independently reviewed. Normal sinus rhythm with RVH.  Assessment/Plan Principal Problem:   Acute respiratory failure with hypoxia Active Problems:   Hypothyroidism   Schizoaffective disorder, unspecified type   Chronic thromboembolic pulmonary hypertension   Hypoxia   Chronic anemia   1. Acute respiratory failure with hypoxia with worsening thromboembolism and pulmonary hypertension - patient was on Apixaban and at this time patient was started on heparin infusion and Apixaban is placed on hold. Patient is placed on BiPAP and patient did receive Lasix 40 mg IV in the ER. Patient's blood pressure is in the lower side and if blood pressure improves may try further dose of Lasix otherwise I have continued patient on home dose of Lasix for now. Closely follow intake and output and metabolic panel. Patient may eventually be transitioned to xarelto. Continue patient's home medications for pulmonary hypertension Macitentan. 2. Chest pain - suspect patient's chest pain is most likely from thromboembolism. Patient's chest pain is retrosternal and at this time  patient also complains of generalized body ache. I have placed patient on when necessary Dilaudid and cycle cardiac markers. 3. Chronic anemia - follow CBC. 4. Hypothyroidism - continue present medications. 5. History of schizoaffective disorder and bipolar - continue present medications.  I had discussed with patient's son and daughter. At this time we will be continuing with BiPAP and patient's home medications. Hospice consult for goals.   DVT Prophylaxis on heparin infusion.  Code Status: DO NOT RESUSCITATE.  Family Communication: Patient's son and daughter. Patient's son can be reached at [phone].  Disposition Plan: Admit to inpatient.    Karlyn Glasco N. [facility] Pager [phone].  If 7PM-7AM, please contact night-coverage [email or web address] Password [email or web address] 01/31/2015, 4:03 AM

## 2015-01-31 NOTE — Progress Notes (Signed)
Dr Candiss Norse notifed of b/p [phone]. No orders received. Pt has no symptoms. B/p increased to 86/48.

## 2015-01-31 NOTE — Progress Notes (Signed)
Daughter Michaela Horton, [place], delivered medication from home to be placed in Pharmacy; Macitentan 10mg  tablets. There were 5 in the bottle. During transport, the lid loosened and one tablet fell out. The daughter, Michaela Horton, gave me permission to return it to the bottle.Medication delivered to Pharmacy. I requested Michaela Horton to sign the"Home Medication sheet stored in Pharmacy" when she returns Friday evening.

## 2015-01-31 NOTE — Care Management Note (Signed)
Case Management Note  Patient Details  Name: Avynn Klassen MRN: [id number] Date of Birth: 1940-09-29  Subjective/Objective:            resp failure on bipap        Action/Plan:home with hhc as before admission   Expected Discharge Date:                 [id number] Expected Discharge Plan:  [place]  In-House Referral:  NA  Discharge planning Services  CM Consult  Post Acute Care Choice:  Resumption of Svcs/PTA Provider Choice offered to:  NA  DME Arranged:  Oxygen DME Agency:     HH Arranged:    HH Agency:     Status of Service:  In process, will continue to follow  Medicare Important Message Given:    Date Medicare IM Given:    Medicare IM give by:    Date Additional Medicare IM Given:    Additional Medicare Important Message give by:     If discussed at [place] of Stay Meetings, dates discussed:    Additional Comments:  Leeroy Cha, RN 01/31/2015, 9:23 AM

## 2015-01-31 NOTE — Plan of Care (Signed)
Problem: Phase I Progression Outcomes Goal: Voiding-avoid urinary catheter unless indicated Outcome: Not Applicable Date Met:  18/86/77 Pt has chronic foley .New foley was inserted 01/25/2015 at home

## 2015-01-31 NOTE — Progress Notes (Signed)
Date:  January 31, 2015 U.R. performed for needs and level of care. Will continue to follow for Case Management needs.  Velva Harman, RN, BSN, [place]   [phone]

## 2015-01-31 NOTE — Consult Note (Signed)
Name: Michaela Horton MRN: [id number] DOB: Aug 21, 1941    ADMISSION DATE:  01/30/2015 CONSULTATION DATE:  01/31/15  REFERRING MD :  Dr. Candiss Norse   CHIEF COMPLAINT:  Hypoxic Respiratory Failure   BRIEF PATIENT DESCRIPTION: 74 y/o F with PMH of chronic PE, PAH, & schizophrenia admitted to [facility] on 5/31 with complaints of chest pain and low oxygen levels.  Found to have new PE burden in RLL.  PCCM consulted for evaluation.   SIGNIFICANT EVENTS: 6/01  Admit per [facility] for chest pain, hypoxic respiratory failure.  BiPAP.    STUDIES:  6/02  Chest CTA >> worsening thromboembolic burden in the RLL, elevated R heart pressures, marked RV/RA dilation   HISTORY OF PRESENT ILLNESS:  74 y/o F with PMH of chronic PE / CTEPH (previously followed at [facility]), severe PAH on home O2 (TTE with severe TR and RSVP > 130 at [facility]), hypothyroidism, poorly controlled bipolar disorder / schizophrenia, medication non-adherence, dementia  and CHF who presented to [facility] on 5/31 with complaints of chest pain and hypoxemia.   She was recently discharged from [facility] after a similar admission for hypoxemic respiratory failure & syncope.  At that time she was discharged home with O2 and hospice care.  She was on apixaban for anticoagulation.     On arrival to the ER, she was found to have saturations of 55% on RA (pt refused to wear O2) but in no distress.  She was placed on a NRB.  She was admitted per [facility].  A CTA of the Chest was obtained on admit which showed new clot burden in the RLL with severe dilation of the RV/RA.  The patient was placed on bipap support with improvement in oxygenation.  Heparin gtt was initiated for anticoagulation.  PCCM consulted for evaluation of PAH, new clot burdern & hypoxic respiratory failure.    PAST MEDICAL HISTORY :   has a past medical history of Dyspnea; Pulmonary embolism; Breast cyst; Hypothyroidism; Renal cyst; Leukocytopenia; Monoclonal gammopathy; Osteoarthritis; Schizoaffective disorder; Wears  dentures; Pulmonary hypertension; and Dementia.  has past surgical history that includes Tubal ligation (1970); Tubal ligation; and Breast surgery.   Prior to Admission medications   Medication Sig Start Date End Date Taking? Authorizing Provider  acetaminophen (TYLENOL) 325 MG tablet Take 2 tablets (650 mg total) by mouth every 6 (six) hours as needed for mild pain, fever or headache. 07/23/14  Yes Christina P Rama, MD  albuterol (PROVENTIL HFA;VENTOLIN HFA) 108 (90 BASE) MCG/ACT inhaler Inhale 1-2 puffs into the lungs every 6 (six) hours as needed for wheezing or shortness of breath. 07/08/14  Yes April Palumbo, MD  albuterol (PROVENTIL) (5 MG/ML) 0.5% nebulizer solution Inhale 2.5 mg into the lungs 4 (four) times daily as needed. Shortness of breath 12/18/14 12/18/15 Yes Historical Provider, MD  apixaban (ELIQUIS) 2.5 MG TABS tablet Take 2.5 mg by mouth every 12 (twelve) hours.   Yes Historical Provider, MD  clonazePAM (KLONOPIN) 0.5 MG tablet Take 0.5 mg by mouth at bedtime.   Yes Historical Provider, MD  clonazePAM (KLONOPIN) 0.5 MG tablet Take 0.25 tablets by mouth 2 (two) times daily. 12/18/14 01/30/15 Yes Historical Provider, MD  [employer] TUSSIN DM COUGH/CHEST 10-200 MG/5ML LIQD Take 5 mLs by mouth every 4 (four) hours as needed. cough 12/18/14  Yes Historical Provider, MD  furosemide (LASIX) 20 MG tablet Take 20 mg by mouth daily.    Yes Historical Provider, MD  lactose free nutrition (BOOST PLUS) LIQD Take 237 mLs by mouth 2 (  two) times daily between meals. Patient taking differently: Take 237 mLs by mouth 2 (two) times daily between meals as needed (nutritional supplement).  07/23/14  Yes Christina P Rama, MD  levothyroxine (SYNTHROID, LEVOTHROID) 125 MCG tablet Take 125 mcg by mouth daily before breakfast.   Yes Historical Provider, MD  Macitentan 10 MG TABS Take 1 tablet by mouth daily.   Yes Historical Provider, MD  Melatonin 3 MG TABS Take 1 tablet by mouth at bedtime.   Yes Historical Provider,  MD  pantoprazole (PROTONIX) 40 MG tablet Take 40 mg by mouth daily.   Yes Historical Provider, MD  polyethylene glycol (MIRALAX / GLYCOLAX) packet Take 17 g by mouth 2 (two) times daily as needed for severe constipation. constipation 12/18/14  Yes Historical Provider, MD  risperiDONE (RISPERDAL) 0.5 MG tablet Take 0.5 mg by mouth every 6 (six) hours as needed. agitation 12/18/14  Yes Historical Provider, MD  risperiDONE microspheres (RISPERDAL CONSTA) 50 MG injection Inject 2 mLs into the muscle every 14 (fourteen) days. Got on 12/28/14 12/18/14 12/18/15 Yes Historical Provider, MD  sennosides-docusate sodium (SENOKOT-S) 8.6-50 MG tablet Take 2 tablets by mouth 2 (two) times daily as needed for constipation.    Yes Historical Provider, MD  traZODone (DESYREL) 50 MG tablet Take 25 mg by mouth at bedtime as needed for sleep.   Yes Historical Provider, MD  divalproex (DEPAKOTE ER) 500 MG 24 hr tablet Take 1 tablet (500 mg total) by mouth 2 (two) times daily. Patient not taking: Reported on 12/31/2014 07/23/14   Venetia Maxon Rama, MD  enoxaparin (LOVENOX) 100 MG/ML injection Inject 0.9 mLs (90 mg total) into the skin daily. Patient not taking: Reported on 12/31/2014 07/23/14   Venetia Maxon Rama, MD  OLANZapine zydis (ZYPREXA) 5 MG disintegrating tablet Take 1 tablet (5 mg total) by mouth at bedtime. Patient not taking: Reported on 12/31/2014 07/23/14   Venetia Maxon Rama, MD  potassium chloride (K-DUR) 10 MEQ tablet Take 1 tablet (10 mEq total) by mouth daily. Patient not taking: Reported on 01/30/2015 01/01/15   Jola Schmidt, MD   Allergies  Allergen Reactions  . Olanzapine Other (See Comments)    Agitation, pts family does not recall    FAMILY HISTORY:  family history includes Alzheimer's disease in her mother; Atrial fibrillation in her mother; Cancer in her father.   SOCIAL HISTORY:  reports that she has never smoked. She has never used smokeless tobacco. She reports that she does not drink alcohol or use  illicit drugs.  REVIEW OF SYSTEMS:  Unable to complete with patient due to bipap & underlying psychiatric illness   SUBJECTIVE:   VITAL SIGNS: Temp:  [98 F (36.7 C)-98.2 F (36.8 C)] 98 F (36.7 C) (06/02 0800) Pulse Rate:  [66-96] 80 (06/02 0800) Resp:  [13-48] 17 (06/02 0800) BP: (62-106)/(35-67) 86/48 mmHg (06/02 0800) SpO2:  [90 %-99 %] 94 % (06/02 0800) FiO2 (%):  [100 %] 100 % (06/02 0600) Weight:  [130 lb (58.968 kg)-156 lb 12 oz (71.1 kg)] 156 lb 12 oz (71.1 kg) (06/02 0315)  PHYSICAL EXAMINATION: General:  Chronically ill adult female in NAD Neuro:  Awake, alert, follows commands, MAE, generalized weakness  HEENT:  BiPAP mask in place, MM pink/moist, jvd+ Cardiovascular:  s1s2 rrr, + murmur Lungs:  resp's even/non-labored on bipap, lungs bilaterally diminished  Abdomen:  Obese /soft, NTND, BSx4 active  Musculoskeletal:  No acute deformities, has difficulty moving LE's  Skin:  BLE 2+ pitting edema    Recent  Labs Lab 01/30/15 2305 01/31/15 0540  NA 136 136  K 3.9 3.7  CL 98* 95*  CO2 31 32  BUN 19 19  CREATININE 0.68 0.59  GLUCOSE 108* 94    Recent Labs Lab 01/30/15 2305  HGB 10.4*  HCT 36.0  WBC 4.3  PLT 293   Ct Angio Chest Pe W/cm &/or Wo Cm  01/31/2015   CLINICAL DATA:  Hypoxia.  Chest pain.  History of pulmonary embolus.  EXAM: CT ANGIOGRAPHY CHEST WITH CONTRAST  TECHNIQUE: Multidetector CT imaging of the chest was performed using the standard protocol during bolus administration of intravenous contrast. Multiplanar CT image reconstructions and MIPs were obtained to evaluate the vascular anatomy.  CONTRAST:  187mL OMNIPAQUE IOHEXOL 350 MG/ML SOLN  COMPARISON:  Radiographs earlier this day, chest CT 07/08/2014  FINDINGS: Worsening thrombotic burden in the right lower lobe posterior basal segment compared to prior exam. There is new nonocclusive thrombus in the medial right middle lobe segmental branch.  The heart remains enlarged. There is right heart  strain with dilatation of the right atrium and ventricle with contrast refluxing into the IVC. Thoracic aorta is normal in caliber. Small right infrahilar lymph node, no mediastinal adenopathy. There is no pericardial effusion.  Small bilateral pleural effusions with adjacent compressive atelectasis of both lower lobes. There is whole body wall edema.  Linear atelectasis or scarring in the right lower lobe and lingula. Tiny subpleural nodularities in the right upper lobe is unchanged.  Cyst in the right lobe of the liver is partially included. There is elevation of left hemidiaphragm that appears unchanged. There are no acute or suspicious osseous abnormalities. Unchanged sclerotic density within T3 vertebral body.  Review of the MIP images confirms the above findings.  IMPRESSION: 1. Worsening thromboembolic burden in the right lung to the lower lobe. New nonocclusive thrombus in the right middle lobe. 2. Elevated right heart pressures with marked dilatation of the right atrium and ventricle and contrast refluxing into the IVC. The degree of right heart dilatation is out of proportion to the thromboembolic burden, and suspect heart failure rather than occlusive thromboembolic disease. There are small bilateral pleural effusions and adjacent compressive atelectasis in the lower lobes. Critical Value/emergent results were called by telephone at the time of interpretation on 01/31/2015 at 1:11 am to Dr. Pryor Curia , who verbally acknowledged these results.   Electronically Signed   By: Jeb Levering M.D.   On: 01/31/2015 01:14   Dg Chest Portable 1 View  01/31/2015   CLINICAL DATA:  74 year old female with hypoxia.  Chest pain.  EXAM: PORTABLE CHEST - 1 VIEW  COMPARISON:  12/31/2014  FINDINGS: The heart is enlarged. Diminished vascular congestion from prior. Unchanged left pleural effusion, and diminished right pleural effusion. Unchanged left base opacity, likely atelectasis. No pneumothorax. Chronic change  about the right shoulder.  IMPRESSION: Improved right base aeration in diminished vascular congestion. Stable cardiomegaly and left pleural effusion.   Electronically Signed   By: Jeb Levering M.D.   On: 01/31/2015 00:19    ASSESSMENT / PLAN:  Acute on Chronic Hypoxic Respiratory Failure  Plan: Titrate O2 to maintain saturations > 92% if possible with PAH  Will need to determine home O2 titration prior to d/c.  [facility] notes reflect 4L but pt does not wear DNI/DNR  BiPAP PRN for increased WOB for now.  Family understands that this may become an inappropriate therapy if she declines  Pulmonary hygiene as able   Chronic PE /  CTEPH  Plan: Will transition from heparin gtt to Xarelto as it eventually offers once a day dosing (hopeful for compliance) Option of lovenox / coumadin was discussed with family but she is on home hospice and Son agrees this would be more discomfort for the patient.   D/C eliquis - would not consider this treatment failure given medical non-compliance   Pulmonary Hypertension   Plan: O2 as above  Continue macitentan   CHF   Plan: Lasix x1 6/2 per primary Monitor I/O's   Schizophrenia  Bipolar Disorder   Plan: Continue risperdal, ativan, clonazepam, trazodone PRN   Noe Gens, NP-C [facility] Pgr: [phone] or [phone] 01/31/2015, 8:29 AM

## 2015-01-31 NOTE — Progress Notes (Signed)
Bilateral lower extremity venous duplex completed:  No obvious evidence of DVT or superficial thrombosis.  There appears to be a Baker's cyst in the left popliteal fossa.  Technically difficult study due to the patient's body habitus.

## 2015-01-31 NOTE — Progress Notes (Signed)
Pt transported to and from CT on BIPAP without incident.  Pt tolerated well, RT to monitor and assess as needed.

## 2015-01-31 NOTE — Progress Notes (Signed)
Ms. Piltz became very agitated at 1630, crying out, difficult to console. Dr. Ronnie Derby notified and Ativan 0.5mg  IVP was given with effectiveness. Dr. Candiss Norse says the goal is Comfort.

## 2015-01-31 NOTE — Progress Notes (Signed)
Patients daughter Beckey Downing called and asked to bring pts Macitentan because hospital pharmacy does not have this med.

## 2015-01-31 NOTE — Progress Notes (Signed)
ANTICOAGULATION CONSULT NOTE - Initial Consult  Pharmacy Consult for IV Heparin Indication: pulmonary embolus  Allergies  Allergen Reactions  . Olanzapine Other (See Comments)    Agitation, pts family does not recall    Patient Measurements: Height: 5' (152.4 cm) Weight: 156 lb 12 oz (71.1 kg) IBW/kg (Calculated) : 45.5 Heparin Dosing Weight: 56 kg  Vital Signs: Temp: 98.2 F (36.8 C) (06/02 0315) Temp Source: Oral (06/02 0315) BP: 87/64 mmHg (06/02 0400) Pulse Rate: 84 (06/02 0400)  Labs:  Recent Labs  01/30/15 2305  HGB 10.4*  HCT 36.0  PLT 293  APTT 27  LABPROT 15.4*  INR 1.21  CREATININE 0.68    Estimated Creatinine Clearance: 55.9 mL/min (by C-G formula based on Cr of 0.68).   Medical History: Past Medical History  Diagnosis Date  . Dyspnea   . Pulmonary embolism   . Breast cyst     right  . Hypothyroidism   . Renal cyst   . Leukocytopenia   . Monoclonal gammopathy   . Osteoarthritis   . Schizoaffective disorder   . Wears dentures   . Pulmonary hypertension   . Dementia     Medications:  Prescriptions prior to admission  Medication Sig Dispense Refill Last Dose  . acetaminophen (TYLENOL) 325 MG tablet Take 2 tablets (650 mg total) by mouth every 6 (six) hours as needed for mild pain, fever or headache.   Past Week at Unknown time  . albuterol (PROVENTIL HFA;VENTOLIN HFA) 108 (90 BASE) MCG/ACT inhaler Inhale 1-2 puffs into the lungs every 6 (six) hours as needed for wheezing or shortness of breath. 1 Inhaler 0 Past Week at Unknown time  . albuterol (PROVENTIL) (5 MG/ML) 0.5% nebulizer solution Inhale 2.5 mg into the lungs 4 (four) times daily as needed. Shortness of breath   Past Month at Unknown time  . apixaban (ELIQUIS) 2.5 MG TABS tablet Take 2.5 mg by mouth every 12 (twelve) hours.   Past Week at Unknown time  . clonazePAM (KLONOPIN) 0.5 MG tablet Take 0.5 mg by mouth at bedtime.   Past Week at Unknown time  . clonazePAM (KLONOPIN) 0.5 MG  tablet Take 0.25 tablets by mouth 2 (two) times daily.   Past Week at Unknown time  . [employer] TUSSIN DM COUGH/CHEST 10-200 MG/5ML LIQD Take 5 mLs by mouth every 4 (four) hours as needed. cough  0 Past Month at Unknown time  . furosemide (LASIX) 20 MG tablet Take 20 mg by mouth daily.    Past Week at Unknown time  . lactose free nutrition (BOOST PLUS) LIQD Take 237 mLs by mouth 2 (two) times daily between meals. (Patient taking differently: Take 237 mLs by mouth 2 (two) times daily between meals as needed (nutritional supplement). )  0 Past Week at Unknown time  . levothyroxine (SYNTHROID, LEVOTHROID) 125 MCG tablet Take 125 mcg by mouth daily before breakfast.   Past Week at Unknown time  . Macitentan 10 MG TABS Take 1 tablet by mouth daily.   Past Week at Unknown time  . Melatonin 3 MG TABS Take 1 tablet by mouth at bedtime.   Past Week at Unknown time  . pantoprazole (PROTONIX) 40 MG tablet Take 40 mg by mouth daily.   Past Week at Unknown time  . polyethylene glycol (MIRALAX / GLYCOLAX) packet Take 17 g by mouth 2 (two) times daily as needed for severe constipation. constipation  0 Past Week at Unknown time  . risperiDONE (RISPERDAL) 0.5 MG tablet Take  0.5 mg by mouth every 6 (six) hours as needed. agitation  1 Past Week at Unknown time  . risperiDONE microspheres (RISPERDAL CONSTA) 50 MG injection Inject 2 mLs into the muscle every 14 (fourteen) days. Got on 12/28/14   01/25/2015 at Unknown time  . sennosides-docusate sodium (SENOKOT-S) 8.6-50 MG tablet Take 2 tablets by mouth 2 (two) times daily as needed for constipation.    Past Week at Unknown time  . traZODone (DESYREL) 50 MG tablet Take 25 mg by mouth at bedtime as needed for sleep.   Past Week at Unknown time  . divalproex (DEPAKOTE ER) 500 MG 24 hr tablet Take 1 tablet (500 mg total) by mouth 2 (two) times daily. (Patient not taking: Reported on 12/31/2014)   Not Taking at Unknown time  . enoxaparin (LOVENOX) 100 MG/ML injection Inject 0.9 mLs (90  mg total) into the skin daily. (Patient not taking: Reported on 12/31/2014) 0 Syringe  Not Taking at Unknown time  . OLANZapine zydis (ZYPREXA) 5 MG disintegrating tablet Take 1 tablet (5 mg total) by mouth at bedtime. (Patient not taking: Reported on 12/31/2014)   Not Taking at Unknown time  . potassium chloride (K-DUR) 10 MEQ tablet Take 1 tablet (10 mEq total) by mouth daily. (Patient not taking: Reported on 01/30/2015) 3 tablet 0    Scheduled:  . antiseptic oral rinse  7 mL Mouth Rinse q12n4p  . chlorhexidine  15 mL Mouth Rinse BID  . clonazepam  0.125 mg Oral BID  . clonazePAM  0.5 mg Oral QHS  . furosemide  20 mg Oral Daily  . levothyroxine  125 mcg Oral QAC breakfast  . Macitentan  10 mg Oral Daily  . pantoprazole  40 mg Oral Daily  . [START ON 02/08/2015] risperiDONE microspheres  50 mg Intramuscular Q14 Days  . sodium chloride  3 mL Intravenous Q12H   Infusions:  . heparin 1,000 Units/hr (01/31/15 0317)    Assessment: 51 yoF presents with hypoxia.  Hx of PE on apixiban, but per daughter pt has not had a dose since sometime last weekend.  IV heparin per Rx for + PE.  Goal of Therapy:  Heparin level 0.3-0.7 units/ml Monitor platelets by anticoagulation protocol: Yes   Plan:   Will be conservative with bolus d/t unclear when last apixiban was given.  CrCl=55 ml/min.  Heparin 1500 units x1  Start drip @ 1000 units/hr  Daily CBC/HL  Check 1st HL in 8 hours  Dorrene German 01/31/2015,4:33 AM

## 2015-01-31 NOTE — Progress Notes (Signed)
Per MD order- RT removed PT from BiPAP. RT placed PT on NRB (15 LPM 02) to accomplish sp02 >=88%. Current Sp02 is 91%- RN aware.

## 2015-01-31 NOTE — Progress Notes (Signed)
Patient Demographics  Michaela Horton, is a 74 y.o. female, DOB - 1940-11-18, GUY:[id number]  Admit date - 01/30/2015   Admitting Physician Rise Patience, MD  Outpatient Primary MD for the patient is Gerrit Heck, MD  LOS - 0   Chief Complaint  Patient presents with  . Chest Pain        Subjective:   Michaela Horton today has, No headache, No chest pain, No abdominal pain - No Nausea, No new weakness tingling or numbness, No Cough - Improved SOB.   Assessment & Plan    1. 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 Currently on heparin drip, also requiring BiPAP. Long-term prognosis poor. Have consult at [facility] provide their input, for now have also talked to patient's son who is a physician in [place], who agrees DO NOT RESUSCITATE.   Gentle medical treatment. If declines full hospice. Palliative care will be called as well. Continue low-dose Lasix as tolerated by blood pressure along with nebulizer treatments and oxygen supplementation for supportive care.    2. Hypothyroidism. Continue home dose Synthroid.    3. Chest pain secondary to RV strain from PEs in the ER upon admission. Troponin mildly elevated not an ACS pattern, EKG nonacute, currently chest pain-free, discussed with son, does not desire echogram etc. Management will not change. She is not a candidate for cardiac catheterization or stent placement. Continue anticoagulation, treat underlying problem as in #1 above as best as we can. Blood pressure too low to tolerate beta blocker.    4. Anemia of chronic  disease. Stable no acute issues.    5. Schizoaffective disorder. Home medications continued.    6. Chronic weakness and deconditioning. Baseline bedbound. Supportive care only.    Code Status: Full  Family Communication:   Talked to Son [name] - he is a [place] in [place] - wants gentle Med Rx, DNR, if worsens Hospice.   2nd Number for other son - no reply.  Disposition Plan:  TBD   Consults PCCM, Pall Care  Procedures CTA - PE +ve  DVT Prophylaxis   Heparin GTT  Lab Results  Component Value Date   PLT 293 01/30/2015    Medications  Scheduled Meds: . antiseptic oral rinse  7 mL Mouth Rinse q12n4p  . chlorhexidine  15 mL Mouth Rinse BID  . clonazepam  0.125 mg Oral BID  . clonazePAM  0.5 mg Oral QHS  . furosemide  20 mg Oral Daily  . levothyroxine  125 mcg Oral QAC breakfast  . Macitentan  10 mg Oral Daily  . pantoprazole  40 mg Oral Daily  . [START ON 02/08/2015] risperiDONE microspheres  50 mg Intramuscular Q14 Days  . sodium chloride  3 mL Intravenous Q12H   Continuous Infusions: . heparin 1,000 Units/hr (01/31/15 0317)   PRN Meds:.acetaminophen **OR** acetaminophen, albuterol, HYDROmorphone (DILAUDID) injection, lactose free nutrition, morphine injection, ondansetron **OR** ondansetron (ZOFRAN) IV, polyethylene glycol, risperiDONE, senna-docusate, traZODone  Antibiotics    Anti-infectives    None  Objective:   Filed Vitals:   01/31/15 0620 01/31/15 0700 01/31/15 0800 01/31/15 0838  BP: 73/40 62/35 86/48  86/48  Pulse: 72 73 80 86  Temp:   98 F (36.7 C)   TempSrc:   Axillary   Resp: 19 21 17 17   Height:      Weight:      SpO2: 99% 95% 94% 92%    Wt Readings from Last 3 Encounters:  01/31/15 71.1 kg (156 lb 12 oz)  12/31/14 56.7 kg (125 lb)  07/18/14 59.285 kg (130 lb 11.2 oz)     Intake/Output Summary (Last 24 hours) at 01/31/15 0932 Last data filed at 01/31/15 0800  Gross per 24 hour  Intake  47.17 ml  Output   1725 ml  Net  -1677.83 ml     Physical Exam  Awake Alert, Oriented X 3, No new F.N deficits, Normal affect [place].AT,PERRAL Supple Neck,No JVD, No cervical lymphadenopathy appriciated.  Symmetrical Chest wall movement, Good air movement bilaterally, CTAB RRR,No Gallops,Rubs or new Murmurs, No Parasternal Heave +ve B.Sounds, Abd Soft, No tenderness, No organomegaly appriciated, No rebound - guarding or rigidity. No Cyanosis, Clubbing or edema, No new Rash or bruise      Data Review   Micro Results Recent Results (from the past 240 hour(s))  MRSA PCR Screening     Status: None   Collection Time: 01/31/15  4:39 AM  Result Value Ref Range Status   MRSA by PCR NEGATIVE NEGATIVE Final    Comment:        The GeneXpert MRSA Assay (FDA approved for NASAL specimens only), is one component of a comprehensive MRSA colonization surveillance program. It is not intended to diagnose MRSA infection nor to guide or monitor treatment for MRSA infections.     Radiology Reports Ct Angio Chest Pe W/cm &/or Wo Cm  01/31/2015   CLINICAL DATA:  Hypoxia.  Chest pain.  History of pulmonary embolus.  EXAM: CT ANGIOGRAPHY CHEST WITH CONTRAST  TECHNIQUE: Multidetector CT imaging of the chest was performed using the standard protocol during bolus administration of intravenous contrast. Multiplanar CT image reconstructions and MIPs were obtained to evaluate the vascular anatomy.  CONTRAST:  150mL OMNIPAQUE IOHEXOL 350 MG/ML SOLN  COMPARISON:  Radiographs earlier this day, chest CT 07/08/2014  FINDINGS: Worsening thrombotic burden in the right lower lobe posterior basal segment compared to prior exam. There is new nonocclusive thrombus in the medial right middle lobe segmental branch.  The heart remains enlarged. There is right heart strain with dilatation of the right atrium and ventricle with contrast refluxing into the IVC. Thoracic aorta is normal in caliber. Small right infrahilar lymph node, no mediastinal adenopathy. There  is no pericardial effusion.  Small bilateral pleural effusions with adjacent compressive atelectasis of both lower lobes. There is whole body wall edema.  Linear atelectasis or scarring in the right lower lobe and lingula. Tiny subpleural nodularities in the right upper lobe is unchanged.  Cyst in the right lobe of the liver is partially included. There is elevation of left hemidiaphragm that appears unchanged. There are no acute or suspicious osseous abnormalities. Unchanged sclerotic density within T3 vertebral body.  Review of the MIP images confirms the above findings.  IMPRESSION: 1. Worsening thromboembolic burden in the right lung to the lower lobe. New nonocclusive thrombus in the right middle lobe. 2. Elevated right heart pressures with marked dilatation of the right atrium and ventricle and contrast refluxing into the IVC. The degree of right heart  dilatation is out of proportion to the thromboembolic burden, and suspect heart failure rather than occlusive thromboembolic disease. There are small bilateral pleural effusions and adjacent compressive atelectasis in the lower lobes. Critical Value/emergent results were called by telephone at the time of interpretation on 01/31/2015 at 1:11 am to Dr. Pryor Curia , who verbally acknowledged these results.   Electronically Signed   By: Jeb Levering M.D.   On: 01/31/2015 01:14   Dg Chest Portable 1 View  01/31/2015   CLINICAL DATA:  74 year old female with hypoxia.  Chest pain.  EXAM: PORTABLE CHEST - 1 VIEW  COMPARISON:  12/31/2014  FINDINGS: The heart is enlarged. Diminished vascular congestion from prior. Unchanged left pleural effusion, and diminished right pleural effusion. Unchanged left base opacity, likely atelectasis. No pneumothorax. Chronic change about the right shoulder.  IMPRESSION: Improved right base aeration in diminished vascular congestion. Stable cardiomegaly and left pleural effusion.   Electronically Signed   By: Jeb Levering M.D.    On: 01/31/2015 00:19     CBC  Recent Labs Lab 01/30/15 2305  WBC 4.3  HGB 10.4*  HCT 36.0  PLT 293  MCV 82.9  MCH 24.0*  MCHC 28.9*  RDW 19.9*  LYMPHSABS 1.7  MONOABS 0.4  EOSABS 0.1  BASOSABS 0.0    Chemistries   Recent Labs Lab 01/30/15 2305 01/31/15 0540  NA 136 136  K 3.9 3.7  CL 98* 95*  CO2 31 32  GLUCOSE 108* 94  BUN 19 19  CREATININE 0.68 0.59  CALCIUM 8.3* 8.6*  AST  --  24  ALT  --  10*  ALKPHOS  --  45  BILITOT  --  0.4   ------------------------------------------------------------------------------------------------------------------ estimated creatinine clearance is 55.9 mL/min (by C-G formula based on Cr of 0.59). ------------------------------------------------------------------------------------------------------------------ No results for input(s): HGBA1C in the last 72 hours. ------------------------------------------------------------------------------------------------------------------ No results for input(s): CHOL, HDL, LDLCALC, TRIG, CHOLHDL, LDLDIRECT in the last 72 hours. ------------------------------------------------------------------------------------------------------------------ No results for input(s): TSH, T4TOTAL, T3FREE, THYROIDAB in the last 72 hours.  Invalid input(s): FREET3 ------------------------------------------------------------------------------------------------------------------ No results for input(s): VITAMINB12, FOLATE, FERRITIN, TIBC, IRON, RETICCTPCT in the last 72 hours.  Coagulation profile  Recent Labs Lab 01/30/15 2305  INR 1.21    No results for input(s): DDIMER in the last 72 hours.  Cardiac Enzymes  Recent Labs Lab 01/31/15 0540  TROPONINI 0.13*   ------------------------------------------------------------------------------------------------------------------ Invalid input(s): POCBNP   Time Spent in minutes   35   SINGH,PRASHANT K M.D on 01/31/2015 at 9:32 AM  Between 7am to 7pm -  Pager - [phone]  After 7pm go to [email or web address] - password [facility]  [facility]   Office  [phone]

## 2015-01-31 NOTE — Progress Notes (Signed)
Pt shows no signs of increase WOB. Pt is mildly tachypneic but does not appear to be in distress. Pt remains on NRB SpO2 88%. RT will continue to monitor as needed.

## 2015-02-01 LAB — CBC WITH DIFFERENTIAL/PLATELET
BASOS ABS: 0 10*3/uL (ref 0.0–0.1)
BASOS PCT: 1 % (ref 0–1)
Eosinophils Absolute: 0.1 10*3/uL (ref 0.0–0.7)
Eosinophils Relative: 3 % (ref 0–5)
HCT: 34.7 % — ABNORMAL LOW (ref 36.0–46.0)
HEMOGLOBIN: 9.8 g/dL — AB (ref 12.0–15.0)
LYMPHS ABS: 1.8 10*3/uL (ref 0.7–4.0)
Lymphocytes Relative: 42 % (ref 12–46)
MCH: 23.9 pg — ABNORMAL LOW (ref 26.0–34.0)
MCHC: 28.2 g/dL — AB (ref 30.0–36.0)
MCV: 84.6 fL (ref 78.0–100.0)
MONOS PCT: 11 % (ref 3–12)
Monocytes Absolute: 0.5 10*3/uL (ref 0.1–1.0)
Neutro Abs: 1.8 10*3/uL (ref 1.7–7.7)
Neutrophils Relative %: 43 % (ref 43–77)
PLATELETS: 280 10*3/uL (ref 150–400)
RBC: 4.1 MIL/uL (ref 3.87–5.11)
RDW: 20.3 % — AB (ref 11.5–15.5)
WBC: 4.2 10*3/uL (ref 4.0–10.5)

## 2015-02-01 LAB — HEPARIN LEVEL (UNFRACTIONATED): Heparin Unfractionated: 2.1 IU/mL — ABNORMAL HIGH (ref 0.30–0.70)

## 2015-02-01 MED ORDER — SODIUM CHLORIDE 0.9 % IV BOLUS (SEPSIS)
500.0000 mL | Freq: Once | INTRAVENOUS | Status: AC
Start: 2015-02-01 — End: 2015-02-01
  Administered 2015-02-01: 500 mL via INTRAVENOUS

## 2015-02-01 MED ORDER — LORAZEPAM 2 MG/ML IJ SOLN
0.5000 mg | INTRAMUSCULAR | Status: DC | PRN
  Administered 2015-02-01 – 2015-02-02 (×3): 0.5 mg via INTRAVENOUS
  Filled 2015-02-01 (×4): qty 1

## 2015-02-01 NOTE — Progress Notes (Signed)
Per Dr. Candiss Norse, place pt on 6L [place] regardless of O2 sats and place back on non-rebreather if increased WOB. Dr. Candiss Norse also aware of low blood pressures. Pt in no distress at this time, will continue to monitor.

## 2015-02-01 NOTE — Progress Notes (Signed)
Patient Demographics  Michaela Horton, is a 74 y.o. female, DOB - 12/11/1940, NID:[id number]  Admit date - 01/30/2015   Admitting Physician Rise Patience, MD  Outpatient Primary MD for the patient is Gerrit Heck, MD  LOS - 1   Chief Complaint  Patient presents with  . Chest Pain        Subjective:   Michaela Horton today has, No headache, No chest pain, No abdominal pain - No Nausea, No new weakness tingling or numbness, No Cough - Improved SOB.   Assessment & Plan    1. 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 She was discharged with hospice from [facility]. Here initially she was on heparin drip and required BiPAP, now transitioned to xaralto under the guidance of pulmonary critical care, oxygen for comfort and symptom control. Long-term prognosis poor. Talk to patient's son who is a physician along with her daughter - patient will be DO NOT RESUSCITATE gentle medical treatment goal directed towards comfort, if declines full hospice and comfort care. Palliative care also called.    2. Hypothyroidism. Continue home dose Synthroid.    3. Chest pain secondary to RV strain from PEs in the ER upon admission. Troponin mildly elevated not an ACS pattern, EKG nonacute, currently chest pain-free, discussed with son, does not desire echogram etc. Management will not change. She is not a candidate for cardiac catheterization or stent placement. Continue anticoagulation, treat underlying problem as in #1 above as best as  we can. Blood pressure too low to tolerate beta blocker.    4. Anemia of chronic disease. Stable no acute issues.    5. Schizoaffective disorder. Home medications continued.    6. Chronic weakness and deconditioning. Baseline bedbound. Supportive care only.    Code Status: Full  Family Communication:  Talked to Son Michaela Horton - he is a [place] in [place] - wants gentle Med Rx, DNR, if worsens Hospice. To the daughter who is in the hospital on 01/31/2015 over the phone. She agrees with the management. Goal is symptom control only. No heroics. Gentle medical treatment to continue.  2nd Number for other son - no reply.   Disposition Plan:  SNF with palliative care or residential hospice   Consults PCCM, [place]  Procedures CTA - PE +ve  DVT Prophylaxis   xaralto  Lab Results  Component Value Date   PLT 280 02/01/2015    Medications  Scheduled Meds: . antiseptic oral rinse  7 mL Mouth Rinse q12n4p  . chlorhexidine  15 mL Mouth Rinse BID  . clonazepam  0.125 mg Oral BID  . clonazePAM  0.5 mg Oral QHS  . furosemide  20 mg Oral Daily  . levothyroxine  125 mcg Oral QAC breakfast  . Macitentan  10 mg Oral Daily  . pantoprazole  40 mg Oral Daily  . [START ON 02/08/2015] risperiDONE microspheres  50 mg Intramuscular Q14 Days  . rivaroxaban  15 mg Oral BID WC  Followed by  . [START ON 02/22/2015] rivaroxaban  20 mg Oral Q breakfast  . sodium chloride  3 mL Intravenous Q12H   Continuous Infusions:   PRN Meds:.acetaminophen **OR** acetaminophen, albuterol, fentaNYL (SUBLIMAZE) injection, lactose free nutrition, LORazepam, ondansetron **OR** ondansetron (ZOFRAN) IV, polyethylene glycol, risperiDONE, senna-docusate, traZODone  Antibiotics    Anti-infectives    None        Objective:   Filed Vitals:   02/01/15 0500 02/01/15 0600 02/01/15 0700 02/01/15 0800  BP: 82/54 88/60 87/59  88/63  Pulse:      Temp:      TempSrc:      Resp: 28 29 33 36  Height:      Weight: 72.4  kg (159 lb 9.8 oz)     SpO2:  87% 88% 78%    Wt Readings from Last 3 Encounters:  02/01/15 72.4 kg (159 lb 9.8 oz)  12/31/14 56.7 kg (125 lb)  07/18/14 59.285 kg (130 lb 11.2 oz)     Intake/Output Summary (Last 24 hours) at 02/01/15 0900 Last data filed at 02/01/15 0800  Gross per 24 hour  Intake 773.73 ml  Output    785 ml  Net -11.27 ml     Physical Exam  Awake Alert, Oriented X 3, No new F.N deficits, Normal affect [place].AT,PERRAL Supple Neck,No JVD, No cervical lymphadenopathy appriciated.  Symmetrical Chest wall movement, Good air movement bilaterally, CTAB RRR,No Gallops,Rubs or new Murmurs, No Parasternal Heave +ve B.Sounds, Abd Soft, No tenderness, No organomegaly appriciated, No rebound - guarding or rigidity. No Cyanosis, Clubbing or edema, No new Rash or bruise      Data Review   Micro Results Recent Results (from the past 240 hour(s))  MRSA PCR Screening     Status: None   Collection Time: 01/31/15  4:39 AM  Result Value Ref Range Status   MRSA by PCR NEGATIVE NEGATIVE Final    Comment:        The GeneXpert MRSA Assay (FDA approved for NASAL specimens only), is one component of a comprehensive MRSA colonization surveillance program. It is not intended to diagnose MRSA infection nor to guide or monitor treatment for MRSA infections.     Radiology Reports Ct Angio Chest Pe W/cm &/or Wo Cm  01/31/2015   CLINICAL DATA:  Hypoxia.  Chest pain.  History of pulmonary embolus.  EXAM: CT ANGIOGRAPHY CHEST WITH CONTRAST  TECHNIQUE: Multidetector CT imaging of the chest was performed using the standard protocol during bolus administration of intravenous contrast. Multiplanar CT image reconstructions and MIPs were obtained to evaluate the vascular anatomy.  CONTRAST:  172mL OMNIPAQUE IOHEXOL 350 MG/ML SOLN  COMPARISON:  Radiographs earlier this day, chest CT 07/08/2014  FINDINGS: Worsening thrombotic burden in the right lower lobe posterior basal segment compared to  prior exam. There is new nonocclusive thrombus in the medial right middle lobe segmental branch.  The heart remains enlarged. There is right heart strain with dilatation of the right atrium and ventricle with contrast refluxing into the IVC. Thoracic aorta is normal in caliber. Small right infrahilar lymph node, no mediastinal adenopathy. There is no pericardial effusion.  Small bilateral pleural effusions with adjacent compressive atelectasis of both lower lobes. There is whole body wall edema.  Linear atelectasis or scarring in the right lower lobe and lingula. Tiny subpleural nodularities in the right upper lobe is unchanged.  Cyst in the right lobe of the liver is partially included. There is elevation of left hemidiaphragm that appears unchanged. There are  no acute or suspicious osseous abnormalities. Unchanged sclerotic density within T3 vertebral body.  Review of the MIP images confirms the above findings.  IMPRESSION: 1. Worsening thromboembolic burden in the right lung to the lower lobe. New nonocclusive thrombus in the right middle lobe. 2. Elevated right heart pressures with marked dilatation of the right atrium and ventricle and contrast refluxing into the IVC. The degree of right heart dilatation is out of proportion to the thromboembolic burden, and suspect heart failure rather than occlusive thromboembolic disease. There are small bilateral pleural effusions and adjacent compressive atelectasis in the lower lobes. Critical Value/emergent results were called by telephone at the time of interpretation on 01/31/2015 at 1:11 am to Dr. Pryor Curia , who verbally acknowledged these results.   Electronically Signed   By: Jeb Levering M.D.   On: 01/31/2015 01:14   Dg Chest Portable 1 View  01/31/2015   CLINICAL DATA:  74 year old female with hypoxia.  Chest pain.  EXAM: PORTABLE CHEST - 1 VIEW  COMPARISON:  12/31/2014  FINDINGS: The heart is enlarged. Diminished vascular congestion from prior. Unchanged  left pleural effusion, and diminished right pleural effusion. Unchanged left base opacity, likely atelectasis. No pneumothorax. Chronic change about the right shoulder.  IMPRESSION: Improved right base aeration in diminished vascular congestion. Stable cardiomegaly and left pleural effusion.   Electronically Signed   By: Jeb Levering M.D.   On: 01/31/2015 00:19     CBC  Recent Labs Lab 01/30/15 2305 02/01/15 0435  WBC 4.3 4.2  HGB 10.4* 9.8*  HCT 36.0 34.7*  PLT 293 280  MCV 82.9 84.6  MCH 24.0* 23.9*  MCHC 28.9* 28.2*  RDW 19.9* 20.3*  LYMPHSABS 1.7 1.8  MONOABS 0.4 0.5  EOSABS 0.1 0.1  BASOSABS 0.0 0.0    Chemistries   Recent Labs Lab 01/30/15 2305 01/31/15 0540  NA 136 136  K 3.9 3.7  CL 98* 95*  CO2 31 32  GLUCOSE 108* 94  BUN 19 19  CREATININE 0.68 0.59  CALCIUM 8.3* 8.6*  AST  --  24  ALT  --  10*  ALKPHOS  --  45  BILITOT  --  0.4   ------------------------------------------------------------------------------------------------------------------ estimated creatinine clearance is 56.5 mL/min (by C-G formula based on Cr of 0.59). ------------------------------------------------------------------------------------------------------------------ No results for input(s): HGBA1C in the last 72 hours. ------------------------------------------------------------------------------------------------------------------ No results for input(s): CHOL, HDL, LDLCALC, TRIG, CHOLHDL, LDLDIRECT in the last 72 hours. ------------------------------------------------------------------------------------------------------------------ No results for input(s): TSH, T4TOTAL, T3FREE, THYROIDAB in the last 72 hours.  Invalid input(s): FREET3 ------------------------------------------------------------------------------------------------------------------ No results for input(s): VITAMINB12, FOLATE, FERRITIN, TIBC, IRON, RETICCTPCT in the last 72 hours.  Coagulation  profile  Recent Labs Lab 01/30/15 2305  INR 1.21    No results for input(s): DDIMER in the last 72 hours.  Cardiac Enzymes  Recent Labs Lab 01/31/15 0540 01/31/15 1150 01/31/15 1730  TROPONINI 0.13* 0.29* 0.33*   ------------------------------------------------------------------------------------------------------------------ Invalid input(s): POCBNP   Time Spent in minutes   35   Prescott Truex K M.D on 02/01/2015 at 9:00 AM  Between 7am to 7pm - Pager - [phone]  After 7pm go to [email or web address] - password [facility]  [facility]   Office  [phone]

## 2015-02-01 NOTE — Plan of Care (Signed)
Problem: Phase I Progression Outcomes Goal: Hemodynamically stable Outcome: Not Applicable Date Met:  98/10/25 End of life care

## 2015-02-01 NOTE — Care Management Note (Signed)
Case Management Note  Patient Details  Name: Michaela Horton MRN: [id number] Date of Birth: [birth year]  Subjective/Objective:        74 yo female admitted with             Action/Plan:  Patient stated that she lives at home alone with the services of [facility] caregivers, a caregiver, Michaela Horton, was at the bedside. The patient stated that she has home O2, hospital bed, hoyer lift, wheelchair, and does not need any DME. Patient confirmed that she has [facility] RN services with [facility]. Spoke with Michaela Horton with [facility] and she confirmed that patient is active with [facility] RN services. Michaela Horton's cell is [phone]. Please write for resumption of [facility] RN orders prior to discharge. Thanks Expected Discharge Date:   (UNKNOWN)               Expected Discharge Plan:  [place]  In-House Referral:  NA  Discharge planning Services  CM Consult  Post Acute Care Choice:  Resumption of Svcs/PTA Provider, Home Health Choice offered to:  NA  DME Arranged:  Oxygen DME Agency:     HH Arranged:    [place]:  [place]  Status of Service:  In process, will continue to follow  Medicare Important Message Given:    Date Medicare IM Given:    Medicare IM give by:    Date Additional Medicare IM Given:    Additional Medicare Important Message give by:     If discussed at [place] of Stay Meetings, dates discussed:    Additional Comments:  Scot Dock, RN 02/01/2015, 1:16 PM

## 2015-02-01 NOTE — Progress Notes (Signed)
Name: Michaela Horton MRN: [id number] DOB: [birth year]    ADMISSION DATE:  01/30/2015 CONSULTATION DATE:  01/31/15  REFERRING MD :  Dr. Candiss Norse   CHIEF COMPLAINT:  Hypoxic Respiratory Failure   BRIEF PATIENT DESCRIPTION: 74 y/o F with PMH of CTEPH,  & schizophrenia admitted to [facility] on 5/31 with complaints of chest pain and low oxygen levels.  Found to have new PE burden in RLL.  PCCM consulted for evaluation.   SIGNIFICANT EVENTS: 6/01  Admit per [facility] for chest pain, hypoxic respiratory failure.  BiPAP.    STUDIES:  6/02  Chest CTA >> worsening thromboembolic burden in the RLL, elevated R heart pressures, marked RV/RA dilation    SUBJECTIVE: off bipap On [place]  VITAL SIGNS: Temp:  [97.9 F (36.6 C)-99.1 F (37.3 C)] 98.1 F (36.7 C) (06/03 0800) Pulse Rate:  [78] 78 (06/02 1100) Resp:  [22-41] 36 (06/03 0800) BP: (68-117)/(47-70) 88/63 mmHg (06/03 0800) SpO2:  [75 %-94 %] 78 % (06/03 0800) FiO2 (%):  [100 %] 100 % (06/02 1400) Weight:  [159 lb 9.8 oz (72.4 kg)] 159 lb 9.8 oz (72.4 kg) (06/03 0500)  PHYSICAL EXAMINATION: General:  Chronically ill adult female in NAD Neuro:  Awake, alert, follows commands, MAE, generalized weakness  HEENT: [place], MM pink/moist, jvd+ Cardiovascular:  s1s2 rrr, + murmur Lungs:  resp's even/non-labored on bipap, lungs bilaterally diminished  Abdomen:  Obese /soft, NTND, BSx4 active  Musculoskeletal:  No acute deformities, has difficulty moving LE's  Skin:  BLE 2+ pitting edema    Recent Labs Lab 01/30/15 2305 01/31/15 0540  NA 136 136  K 3.9 3.7  CL 98* 95*  CO2 31 32  BUN 19 19  CREATININE 0.68 0.59  GLUCOSE 108* 94    Recent Labs Lab 01/30/15 2305 02/01/15 0435  HGB 10.4* 9.8*  HCT 36.0 34.7*  WBC 4.3 4.2  PLT 293 280   Ct Angio Chest Pe W/cm &/or Wo Cm  01/31/2015   CLINICAL DATA:  Hypoxia.  Chest pain.  History of pulmonary embolus.  EXAM: CT ANGIOGRAPHY CHEST WITH CONTRAST  TECHNIQUE: Multidetector CT imaging of the chest was  performed using the standard protocol during bolus administration of intravenous contrast. Multiplanar CT image reconstructions and MIPs were obtained to evaluate the vascular anatomy.  CONTRAST:  165mL OMNIPAQUE IOHEXOL 350 MG/ML SOLN  COMPARISON:  Radiographs earlier this day, chest CT 07/08/2014  FINDINGS: Worsening thrombotic burden in the right lower lobe posterior basal segment compared to prior exam. There is new nonocclusive thrombus in the medial right middle lobe segmental branch.  The heart remains enlarged. There is right heart strain with dilatation of the right atrium and ventricle with contrast refluxing into the IVC. Thoracic aorta is normal in caliber. Small right infrahilar lymph node, no mediastinal adenopathy. There is no pericardial effusion.  Small bilateral pleural effusions with adjacent compressive atelectasis of both lower lobes. There is whole body wall edema.  Linear atelectasis or scarring in the right lower lobe and lingula. Tiny subpleural nodularities in the right upper lobe is unchanged.  Cyst in the right lobe of the liver is partially included. There is elevation of left hemidiaphragm that appears unchanged. There are no acute or suspicious osseous abnormalities. Unchanged sclerotic density within T3 vertebral body.  Review of the MIP images confirms the above findings.  IMPRESSION: 1. Worsening thromboembolic burden in the right lung to the lower lobe. New nonocclusive thrombus in the right middle lobe. 2. Elevated right heart pressures with  marked dilatation of the right atrium and ventricle and contrast refluxing into the IVC. The degree of right heart dilatation is out of proportion to the thromboembolic burden, and suspect heart failure rather than occlusive thromboembolic disease. There are small bilateral pleural effusions and adjacent compressive atelectasis in the lower lobes. Critical Value/emergent results were called by telephone at the time of interpretation on  01/31/2015 at 1:11 am to Dr. Pryor Curia , who verbally acknowledged these results.   Electronically Signed   By: Jeb Levering M.D.   On: 01/31/2015 01:14   Dg Chest Portable 1 View  01/31/2015   CLINICAL DATA:  74 year old female with hypoxia.  Chest pain.  EXAM: PORTABLE CHEST - 1 VIEW  COMPARISON:  12/31/2014  FINDINGS: The heart is enlarged. Diminished vascular congestion from prior. Unchanged left pleural effusion, and diminished right pleural effusion. Unchanged left base opacity, likely atelectasis. No pneumothorax. Chronic change about the right shoulder.  IMPRESSION: Improved right base aeration in diminished vascular congestion. Stable cardiomegaly and left pleural effusion.   Electronically Signed   By: Jeb Levering M.D.   On: 01/31/2015 00:19    ASSESSMENT / PLAN:  Acute on Chronic Hypoxic Respiratory Failure  Plan: Titrate O2 to maintain saturations > 88% if possible with PAH  DNI/DNR  Do not reinstate bipap   Chronic PE / CTEPH  Plan: Unclear if this is apixaban failure or non comliance - caregiver does state she takes her meds Will transition from heparin gtt to Xarelto as it eventually offers once a day dosing (hopeful for compliance) Option of lovenox / coumadin was discussed with family but she is on home hospice and Son agrees this would be more discomfort for the patient.     Pulmonary Hypertension   Plan: O2 as above  Continue macitentan   CHF   Plan: Lasix  per primary Monitor I/O's   Schizophrenia  Bipolar Disorder   Plan: Continue risperdal, ativan, clonazepam, trazodone PRN  PCCm to sign off Can transfer to floor, would instate hospice this time on discharge   Kara Mead MD. FCCP. [facility] Pager [phone] If no response call [phone]    02/01/2015, 10:29 AM

## 2015-02-01 NOTE — Progress Notes (Signed)
Text paged MD to notify that patient is still very anxious and to request increased frequency or dose of ativan.

## 2015-02-01 NOTE — Progress Notes (Signed)
Dr. Candiss Norse confirmed that patient should remain on 6L [place] regardless of O2 saturation and that nebulizer treatments should be given if patient has labored breathing.

## 2015-02-02 NOTE — Discharge Instructions (Addendum)
Information on my medicine - XARELTO (rivaroxaban)  This medication education was reviewed with me or my healthcare representative as part of my discharge preparation.  The pharmacist that spoke with me during my hospital stay was:  Minda Ditto, [place]? Xarelto was prescribed to treat blood clots that may have been found in the veins of your legs (deep vein thrombosis) or in your lungs (pulmonary embolism) and to reduce the risk of them occurring again.  What do you need to know about Xarelto? The starting dose is one 15 mg tablet taken TWICE daily with food for the FIRST 21 DAYS then on (enter date)  02/22/15  the dose is changed to one 20 mg tablet taken ONCE A DAY with your evening meal.  DO NOT stop taking Xarelto without talking to the health care provider who prescribed the medication.  Refill your prescription for 20 mg tablets before you run out.  After discharge, you should have regular check-up appointments with your healthcare provider that is prescribing your Xarelto.  In the future your dose may need to be changed if your kidney function changes by a significant amount.  What do you do if you miss a dose? If you are taking Xarelto TWICE DAILY and you miss a dose, take it as soon as you remember. You may take two 15 mg tablets (total 30 mg) at the same time then resume your regularly scheduled 15 mg twice daily the next day.  If you are taking Xarelto ONCE DAILY and you miss a dose, take it as soon as you remember on the same day then continue your regularly scheduled once daily regimen the next day. Do not take two doses of Xarelto at the same time.   Important Safety Information Xarelto is a blood thinner medicine that can cause bleeding. You should call your healthcare provider right away if you experience any of the following: ? Bleeding from an injury or your nose that does not stop. ? Unusual colored urine (red or dark brown) or  unusual colored stools (red or black). ? Unusual bruising for unknown reasons. ? A serious fall or if you hit your head (even if there is no bleeding).  Some medicines may interact with Xarelto and might increase your risk of bleeding while on Xarelto. To help avoid this, consult your healthcare provider or pharmacist prior to using any new prescription or non-prescription medications, including herbals, vitamins, non-steroidal anti-inflammatory drugs (NSAIDs) and supplements.  This medication works the same way Eliquis did, but the advantage is once daily dosing.  This website has more information on Xarelto: [email or web address]    Follow with Primary MD Gerrit Heck, MD in 7 days if desired   Activity: As tolerated with Full fall precautions use walker/cane & assistance as needed   Disposition Home with Hospice   Diet: Heart Healthy   with feeding assistance and aspiration precautions.  For Heart failure patients - Check your Weight same time everyday, if you gain over 2 pounds, or you develop in leg swelling, experience more shortness of breath or chest pain, call your Primary MD immediately. Follow Cardiac Low Salt Diet and 1.5 lit/day fluid restriction.   On your next visit with your primary care physician please Get Medicines reviewed and adjusted.   Please request your Prim.MD to go over all Hospital Tests and Procedure/Radiological results at the follow up, please get all Hospital records sent to your Prim MD by signing hospital release  before you go home.   If you experience worsening of your admission symptoms, develop shortness of breath, life threatening emergency, suicidal or homicidal thoughts you must seek medical attention immediately by calling 911 or calling your MD immediately  if symptoms less severe.  You Must read complete instructions/literature along with all the possible adverse reactions/side effects for all the Medicines you take and that have  been prescribed to you. Take any new Medicines after you have completely understood and accpet all the possible adverse reactions/side effects.   Do not drive, operating heavy machinery, perform activities at heights, swimming or participation in water activities or provide baby sitting services if your were admitted for syncope or siezures until you have seen by Primary MD or a Neurologist and advised to do so again.  Do not drive when taking Pain medications.    Do not take more than prescribed Pain, Sleep and Anxiety Medications  Special Instructions: If you have smoked or chewed Tobacco  in the last 2 yrs please stop smoking, stop any regular Alcohol  and or any Recreational drug use.  Wear Seat belts while driving.   Please note  You were cared for by a hospitalist during your hospital stay. If you have any questions about your discharge medications or the care you received while you were in the hospital after you are discharged, you can call the unit and asked to speak with the hospitalist on call if the hospitalist that took care of you is not available. Once you are discharged, your primary care physician will handle any further medical issues. Please note that NO REFILLS for any discharge medications will be authorized once you are discharged, as it is imperative that you return to your primary care physician (or establish a relationship with a primary care physician if you do not have one) for your aftercare needs so that they can reassess your need for medications and monitor your lab values.

## 2015-02-02 NOTE — Progress Notes (Signed)
Patient Demographics  Michaela Horton, is a 74 y.o. female, DOB - 30-Aug-1941, KGM:[id number]  Admit date - 01/30/2015   Admitting Physician Michaela Patience, MD  Outpatient Primary MD for the patient is Michaela Heck, MD  LOS - 2   Chief Complaint  Patient presents with  . Chest Pain        Subjective:   Michaela Horton today has, No headache, No chest pain, No abdominal pain - No Nausea, No new weakness tingling or numbness, No Cough - Improved SOB.   Assessment & Plan    1. Acute on Chr. Resp Failure - due to Chronic recurrent PEs with severe Pulm HTN now appears end-stage, Lower Ext Venous US -ve, failed or resistant to Eliquis with CT angiogram showing new clot burden in the lungs, had recently been to [facility] and has multiple recent admissions with poor quality of life and largely bedbound. She was discharged with hospice from [facility]. Here initially she was on heparin drip and required BiPAP, now transitioned to xaralto under the guidance of pulmonary critical care, oxygen for comfort and symptom control. Long-term prognosis poor.   Talked to patient's son who is a physician along with her daughter - patient will be DO NOT RESUSCITATE gentle medical treatment goal directed towards comfort, if declines full hospice and comfort care. Palliative care also called await input.    2. Hypothyroidism. Continue home dose Synthroid.    3. Chest pain secondary to RV strain from PEs in the ER upon admission. Troponin mildly elevated not an ACS pattern, EKG nonacute, currently chest pain-free, discussed with son, does not desire echogram etc. Management will not change. She is not a candidate for cardiac catheterization or stent placement. Continue anticoagulation, treat underlying problem as in #1  above as best as we can. Blood pressure too low to tolerate beta blocker.    4. Anemia of chronic disease. Stable no acute issues.    5. Schizoaffective disorder. Home medications continued.    6. Chronic weakness and deconditioning. Baseline bedbound. Supportive care only.   7. Chronic indwelling Foley. Continue    Code Status: Full  Family Communication:  Talked to Son Michaela Horton - he is a MD in [place] - wants gentle Med Rx, DNR, if worsens Hospice. To the daughter who is in the hospital on 01/31/2015 over the phone. She agrees with the management. Goal is symptom control only. No heroics. Gentle medical treatment to continue.    Disposition Plan:  SNF with palliative care or residential hospice   Consults PCCM, [place]  Procedures CTA - PE +ve  DVT Prophylaxis   xaralto  Lab Results  Component Value Date   PLT 280 02/01/2015    Medications  Scheduled Meds: . antiseptic oral rinse  7 mL Mouth Rinse q12n4p  . chlorhexidine  15 mL Mouth Rinse BID  . clonazepam  0.125 mg Oral BID  . clonazePAM  0.5 mg Oral QHS  . furosemide  20 mg Oral Daily  . levothyroxine  125 mcg Oral QAC breakfast  . Macitentan  10 mg Oral Daily  . pantoprazole  40 mg Oral Daily  . [START ON 02/08/2015] risperiDONE microspheres  50 mg Intramuscular Q14 Days  . rivaroxaban  15 mg  Oral BID WC   Followed by  . [START ON 02/22/2015] rivaroxaban  20 mg Oral Q breakfast  . sodium chloride  3 mL Intravenous Q12H   Continuous Infusions:   PRN Meds:.acetaminophen **OR** acetaminophen, albuterol, fentaNYL (SUBLIMAZE) injection, lactose free nutrition, LORazepam, ondansetron **OR** ondansetron (ZOFRAN) IV, polyethylene glycol, risperiDONE, senna-docusate, traZODone  Antibiotics    Anti-infectives    None        Objective:   Filed Vitals:   02/01/15 1109 02/01/15 1617 02/01/15 2050 02/02/15 0509  BP: 105/69 125/80 109/58 101/64  Pulse: 99 113 110 93  Temp: 98.3 F (36.8 C) 98.5 F (36.9  C) 98.3 F (36.8 C) 98.7 F (37.1 C)  TempSrc: Axillary Axillary Oral Oral  Resp: 32 24 20 20   Height:      Weight:      SpO2: 70% 64% 65% 67%    Wt Readings from Last 3 Encounters:  02/01/15 72.4 kg (159 lb 9.8 oz)  12/31/14 56.7 kg (125 lb)  07/18/14 59.285 kg (130 lb 11.2 oz)     Intake/Output Summary (Last 24 hours) at 02/02/15 1116 Last data filed at 02/02/15 0930  Gross per 24 hour  Intake    340 ml  Output    550 ml  Net   -210 ml     Physical Exam  Awake Alert, Oriented X 3, No new F.N deficits, Normal affect [place].AT,PERRAL Supple Neck,No JVD, No cervical lymphadenopathy appriciated.  Symmetrical Chest wall movement, Good air movement bilaterally, CTAB RRR,No Gallops,Rubs or new Murmurs, No Parasternal Heave +ve B.Sounds, Abd Soft, No tenderness, No organomegaly appriciated, No rebound - guarding or rigidity. Chronic Foley in place No Cyanosis, Clubbing or edema, No new Rash or bruise      Data Review   Micro Results Recent Results (from the past 240 hour(s))  MRSA PCR Screening     Status: None   Collection Time: 01/31/15  4:39 AM  Result Value Ref Range Status   MRSA by PCR NEGATIVE NEGATIVE Final    Comment:        The GeneXpert MRSA Assay (FDA approved for NASAL specimens only), is one component of a comprehensive MRSA colonization surveillance program. It is not intended to diagnose MRSA infection nor to guide or monitor treatment for MRSA infections.     Radiology Reports Ct Angio Chest Pe W/cm &/or Wo Cm  01/31/2015   CLINICAL DATA:  Hypoxia.  Chest pain.  History of pulmonary embolus.  EXAM: CT ANGIOGRAPHY CHEST WITH CONTRAST  TECHNIQUE: Multidetector CT imaging of the chest was performed using the standard protocol during bolus administration of intravenous contrast. Multiplanar CT image reconstructions and MIPs were obtained to evaluate the vascular anatomy.  CONTRAST:  132mL OMNIPAQUE IOHEXOL 350 MG/ML SOLN  COMPARISON:  Radiographs earlier  this day, chest CT 07/08/2014  FINDINGS: Worsening thrombotic burden in the right lower lobe posterior basal segment compared to prior exam. There is new nonocclusive thrombus in the medial right middle lobe segmental branch.  The heart remains enlarged. There is right heart strain with dilatation of the right atrium and ventricle with contrast refluxing into the IVC. Thoracic aorta is normal in caliber. Small right infrahilar lymph node, no mediastinal adenopathy. There is no pericardial effusion.  Small bilateral pleural effusions with adjacent compressive atelectasis of both lower lobes. There is whole body wall edema.  Linear atelectasis or scarring in the right lower lobe and lingula. Tiny subpleural nodularities in the right upper lobe is unchanged.  Cyst  in the right lobe of the liver is partially included. There is elevation of left hemidiaphragm that appears unchanged. There are no acute or suspicious osseous abnormalities. Unchanged sclerotic density within T3 vertebral body.  Review of the MIP images confirms the above findings.  IMPRESSION: 1. Worsening thromboembolic burden in the right lung to the lower lobe. New nonocclusive thrombus in the right middle lobe. 2. Elevated right heart pressures with marked dilatation of the right atrium and ventricle and contrast refluxing into the IVC. The degree of right heart dilatation is out of proportion to the thromboembolic burden, and suspect heart failure rather than occlusive thromboembolic disease. There are small bilateral pleural effusions and adjacent compressive atelectasis in the lower lobes. Critical Value/emergent results were called by telephone at the time of interpretation on 01/31/2015 at 1:11 am to Dr. Pryor Curia , who verbally acknowledged these results.   Electronically Signed   By: Jeb Levering M.D.   On: 01/31/2015 01:14   Dg Chest Portable 1 View  01/31/2015   CLINICAL DATA:  74 year old female with hypoxia.  Chest pain.  EXAM:  PORTABLE CHEST - 1 VIEW  COMPARISON:  12/31/2014  FINDINGS: The heart is enlarged. Diminished vascular congestion from prior. Unchanged left pleural effusion, and diminished right pleural effusion. Unchanged left base opacity, likely atelectasis. No pneumothorax. Chronic change about the right shoulder.  IMPRESSION: Improved right base aeration in diminished vascular congestion. Stable cardiomegaly and left pleural effusion.   Electronically Signed   By: Jeb Levering M.D.   On: 01/31/2015 00:19     CBC  Recent Labs Lab 01/30/15 2305 02/01/15 0435  WBC 4.3 4.2  HGB 10.4* 9.8*  HCT 36.0 34.7*  PLT 293 280  MCV 82.9 84.6  MCH 24.0* 23.9*  MCHC 28.9* 28.2*  RDW 19.9* 20.3*  LYMPHSABS 1.7 1.8  MONOABS 0.4 0.5  EOSABS 0.1 0.1  BASOSABS 0.0 0.0    Chemistries   Recent Labs Lab 01/30/15 2305 01/31/15 0540  NA 136 136  K 3.9 3.7  CL 98* 95*  CO2 31 32  GLUCOSE 108* 94  BUN 19 19  CREATININE 0.68 0.59  CALCIUM 8.3* 8.6*  AST  --  24  ALT  --  10*  ALKPHOS  --  45  BILITOT  --  0.4   ------------------------------------------------------------------------------------------------------------------ estimated creatinine clearance is 56.5 mL/min (by C-G formula based on Cr of 0.59). ------------------------------------------------------------------------------------------------------------------ No results for input(s): HGBA1C in the last 72 hours. ------------------------------------------------------------------------------------------------------------------ No results for input(s): CHOL, HDL, LDLCALC, TRIG, CHOLHDL, LDLDIRECT in the last 72 hours. ------------------------------------------------------------------------------------------------------------------ No results for input(s): TSH, T4TOTAL, T3FREE, THYROIDAB in the last 72 hours.  Invalid input(s):  FREET3 ------------------------------------------------------------------------------------------------------------------ No results for input(s): VITAMINB12, FOLATE, FERRITIN, TIBC, IRON, RETICCTPCT in the last 72 hours.  Coagulation profile  Recent Labs Lab 01/30/15 2305  INR 1.21    No results for input(s): DDIMER in the last 72 hours.  Cardiac Enzymes  Recent Labs Lab 01/31/15 0540 01/31/15 1150 01/31/15 1730  TROPONINI 0.13* 0.29* 0.33*   ------------------------------------------------------------------------------------------------------------------ Invalid input(s): POCBNP   Time Spent in minutes   35   SINGH,PRASHANT K M.D on 02/02/2015 at 11:16 AM  Between 7am to 7pm - Pager - [phone]  After 7pm go to [email or web address] - password [facility]  [facility]   Office  [phone]

## 2015-02-02 NOTE — Progress Notes (Signed)
Pt sats 76 on 14L [place].  Spoke with Respiratory Therapy and advised to put pt on non rebreather without humidification.  Put pt on non rebreather.

## 2015-02-02 NOTE — Progress Notes (Signed)
Called to pt room by family.  Pt removed non breather, reporting "take this off, it is too much air".  Pt sats 82%.  Pt requests to go on nasal cannula.  Put pt back on nasal cannula 15L.  Pt son at bedside reports pt is usually 77-78% sats.

## 2015-02-03 DIAGNOSIS — Z515 Encounter for palliative care: Secondary | ICD-10-CM

## 2015-02-03 MED ORDER — TRAZODONE HCL 50 MG PO TABS
50.0000 mg | ORAL_TABLET | Freq: Every day | ORAL | Status: DC
  Administered 2015-02-03: 50 mg via ORAL
  Filled 2015-02-03 (×3): qty 1

## 2015-02-03 MED ORDER — MORPHINE SULFATE 10 MG/5ML PO SOLN
2.5000 mg | Freq: Three times a day (TID) | ORAL | Status: DC | PRN
  Administered 2015-02-04: 2.5 mg via ORAL
  Filled 2015-02-03 (×2): qty 5

## 2015-02-03 MED ORDER — HALOPERIDOL LACTATE 5 MG/ML IJ SOLN
2.0000 mg | Freq: Four times a day (QID) | INTRAMUSCULAR | Status: DC | PRN
  Administered 2015-02-03 – 2015-02-04 (×3): 2 mg via INTRAVENOUS
  Filled 2015-02-03 (×3): qty 1

## 2015-02-03 MED ORDER — RISPERIDONE 1 MG PO TABS
1.0000 mg | ORAL_TABLET | Freq: Four times a day (QID) | ORAL | Status: DC | PRN
  Administered 2015-02-03 – 2015-02-04 (×2): 1 mg via ORAL
  Filled 2015-02-03 (×4): qty 1

## 2015-02-03 MED ORDER — CLONAZEPAM 0.5 MG PO TABS: 0.2500 mg | ORAL_TABLET | Freq: Every morning | ORAL | Status: DC

## 2015-02-03 NOTE — Progress Notes (Signed)
Patient Demographics  Michaela Horton, is a 74 y.o. female, DOB - [birth year], MOL:[id number]  Admit date - 01/30/2015   Admitting Physician Rise Patience, MD  Outpatient Primary MD for the patient is Gerrit Heck, MD  LOS - 3   Chief Complaint  Patient presents with  . Chest Pain        Subjective:   Rheba Horton today has, No headache, No chest pain, No abdominal pain - No Nausea, No new weakness tingling or numbness, No Cough - Improved SOB. She does feel sad and worried.  Assessment & Plan    1. Acute on Chr. 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 She was discharged with hospice from [facility]. Here initially she was on heparin drip and required BiPAP, now transitioned to xaralto under the guidance of pulmonary critical care, oxygen for comfort and symptom control. Long-term prognosis poor.   Talked to patient's son who is a physician along with her daughter - patient will be DO NOT RESUSCITATE gentle medical treatment goal directed towards comfort, if declines full hospice and comfort care. Requested palliative care again on 02/03/2015 to evaluate the patient.    2. Hypothyroidism. Continue home dose Synthroid.    3. Chest pain secondary to RV strain from PEs in the ER upon admission. Troponin mildly elevated not an ACS pattern, EKG nonacute, currently chest pain-free, discussed with son, does not desire echogram etc. Management will not change. She is not a candidate for cardiac catheterization or stent placement.  Continue anticoagulation, treat underlying problem as in #1 above as best as we can. Blood pressure too low to tolerate beta blocker.    4. Anemia of chronic disease. Stable no acute issues.    5. Schizoaffective disorder. Home medications continued.    6. Chronic weakness and deconditioning. Baseline bedbound. Supportive care only.   7. Chronic indwelling Foley. Continue    Code Status: Full  Family Communication:  Talked to Son [name] - he is a [place] in [place] - wants gentle Med Rx, DNR, if worsens Hospice. To the daughter who is in the hospital on 01/31/2015 over the phone. She agrees with the management. Goal is symptom control only. No heroics. Gentle medical treatment to continue.    Disposition Plan:  SNF with palliative care or residential hospice   Consults PCCM, [place]  Procedures CTA - PE +ve  DVT Prophylaxis   xaralto  Lab Results  Component Value Date   PLT 280 02/01/2015    Medications  Scheduled Meds: . antiseptic oral rinse  7 mL Mouth Rinse q12n4p  . chlorhexidine  15 mL Mouth Rinse BID  . clonazepam  0.125 mg Oral BID  . clonazePAM  0.5 mg Oral QHS  . furosemide  20 mg Oral Daily  . levothyroxine  125 mcg Oral QAC breakfast  . Macitentan  10 mg Oral Daily  . pantoprazole  40 mg Oral Daily  . [START ON 02/08/2015] risperiDONE microspheres  50 mg  Intramuscular Q14 Days  . rivaroxaban  15 mg Oral BID WC   Followed by  . [START ON 02/22/2015] rivaroxaban  20 mg Oral Q breakfast  . sodium chloride  3 mL Intravenous Q12H   Continuous Infusions:   PRN Meds:.acetaminophen **OR** acetaminophen, albuterol, fentaNYL (SUBLIMAZE) injection, lactose free nutrition, ondansetron **OR** ondansetron (ZOFRAN) IV, polyethylene glycol, risperiDONE, senna-docusate, traZODone  Antibiotics    Anti-infectives    None        Objective:   Filed Vitals:   02/02/15 0509 02/02/15 1500 02/02/15 2111 02/03/15 0613  BP: 101/64 84/52 101/61 90/53  Pulse: 93 91  102 96  Temp: 98.7 F (37.1 C) 98.9 F (37.2 C) 98.2 F (36.8 C) 98.2 F (36.8 C)  TempSrc: Oral Oral Oral Oral  Resp: 20 21 22  42  Height:      Weight:      SpO2: 67% 78% 73% 70%    Wt Readings from Last 3 Encounters:  02/01/15 72.4 kg (159 lb 9.8 oz)  12/31/14 56.7 kg (125 lb)  07/18/14 59.285 kg (130 lb 11.2 oz)     Intake/Output Summary (Last 24 hours) at 02/03/15 0939 Last data filed at 02/02/15 1901  Gross per 24 hour  Intake    240 ml  Output    400 ml  Net   -160 ml     Physical Exam  Awake Alert, Oriented X 2, No new F.N deficits, Depressed affect [place].AT,PERRAL Supple Neck,No JVD, No cervical lymphadenopathy appriciated.  Symmetrical Chest wall movement, Good air movement bilaterally, CTAB RRR,No Gallops,Rubs or new Murmurs, No Parasternal Heave +ve B.Sounds, Abd Soft, No tenderness, No organomegaly appriciated, No rebound - guarding or rigidity. Chronic Foley in place No Cyanosis, Clubbing or edema, No new Rash or bruise      Data Review   Micro Results Recent Results (from the past 240 hour(s))  MRSA PCR Screening     Status: None   Collection Time: 01/31/15  4:39 AM  Result Value Ref Range Status   MRSA by PCR NEGATIVE NEGATIVE Final    Comment:        The GeneXpert MRSA Assay (FDA approved for NASAL specimens only), is one component of a comprehensive MRSA colonization surveillance program. It is not intended to diagnose MRSA infection nor to guide or monitor treatment for MRSA infections.     Radiology Reports Ct Angio Chest Pe W/cm &/or Wo Cm  01/31/2015   CLINICAL DATA:  Hypoxia.  Chest pain.  History of pulmonary embolus.  EXAM: CT ANGIOGRAPHY CHEST WITH CONTRAST  TECHNIQUE: Multidetector CT imaging of the chest was performed using the standard protocol during bolus administration of intravenous contrast. Multiplanar CT image reconstructions and MIPs were obtained to evaluate the vascular anatomy.  CONTRAST:  127mL OMNIPAQUE IOHEXOL 350  MG/ML SOLN  COMPARISON:  Radiographs earlier this day, chest CT 07/08/2014  FINDINGS: Worsening thrombotic burden in the right lower lobe posterior basal segment compared to prior exam. There is new nonocclusive thrombus in the medial right middle lobe segmental branch.  The heart remains enlarged. There is right heart strain with dilatation of the right atrium and ventricle with contrast refluxing into the IVC. Thoracic aorta is normal in caliber. Small right infrahilar lymph node, no mediastinal adenopathy. There is no pericardial effusion.  Small bilateral pleural effusions with adjacent compressive atelectasis of both lower lobes. There is whole body wall edema.  Linear atelectasis or scarring in the right lower lobe and lingula. Tiny subpleural nodularities in  the right upper lobe is unchanged.  Cyst in the right lobe of the liver is partially included. There is elevation of left hemidiaphragm that appears unchanged. There are no acute or suspicious osseous abnormalities. Unchanged sclerotic density within T3 vertebral body.  Review of the MIP images confirms the above findings.  IMPRESSION: 1. Worsening thromboembolic burden in the right lung to the lower lobe. New nonocclusive thrombus in the right middle lobe. 2. Elevated right heart pressures with marked dilatation of the right atrium and ventricle and contrast refluxing into the IVC. The degree of right heart dilatation is out of proportion to the thromboembolic burden, and suspect heart failure rather than occlusive thromboembolic disease. There are small bilateral pleural effusions and adjacent compressive atelectasis in the lower lobes. Critical Value/emergent results were called by telephone at the time of interpretation on 01/31/2015 at 1:11 am to Dr. Pryor Curia , who verbally acknowledged these results.   Electronically Signed   By: Jeb Levering M.D.   On: 01/31/2015 01:14   Dg Chest Portable 1 View  01/31/2015   CLINICAL DATA:  74 year old  female with hypoxia.  Chest pain.  EXAM: PORTABLE CHEST - 1 VIEW  COMPARISON:  12/31/2014  FINDINGS: The heart is enlarged. Diminished vascular congestion from prior. Unchanged left pleural effusion, and diminished right pleural effusion. Unchanged left base opacity, likely atelectasis. No pneumothorax. Chronic change about the right shoulder.  IMPRESSION: Improved right base aeration in diminished vascular congestion. Stable cardiomegaly and left pleural effusion.   Electronically Signed   By: Jeb Levering M.D.   On: 01/31/2015 00:19     CBC  Recent Labs Lab 01/30/15 2305 02/01/15 0435  WBC 4.3 4.2  HGB 10.4* 9.8*  HCT 36.0 34.7*  PLT 293 280  MCV 82.9 84.6  MCH 24.0* 23.9*  MCHC 28.9* 28.2*  RDW 19.9* 20.3*  LYMPHSABS 1.7 1.8  MONOABS 0.4 0.5  EOSABS 0.1 0.1  BASOSABS 0.0 0.0    Chemistries   Recent Labs Lab 01/30/15 2305 01/31/15 0540  NA 136 136  K 3.9 3.7  CL 98* 95*  CO2 31 32  GLUCOSE 108* 94  BUN 19 19  CREATININE 0.68 0.59  CALCIUM 8.3* 8.6*  AST  --  24  ALT  --  10*  ALKPHOS  --  45  BILITOT  --  0.4   ------------------------------------------------------------------------------------------------------------------ estimated creatinine clearance is 56.5 mL/min (by C-G formula based on Cr of 0.59). ------------------------------------------------------------------------------------------------------------------ No results for input(s): HGBA1C in the last 72 hours. ------------------------------------------------------------------------------------------------------------------ No results for input(s): CHOL, HDL, LDLCALC, TRIG, CHOLHDL, LDLDIRECT in the last 72 hours. ------------------------------------------------------------------------------------------------------------------ No results for input(s): TSH, T4TOTAL, T3FREE, THYROIDAB in the last 72 hours.  Invalid input(s):  FREET3 ------------------------------------------------------------------------------------------------------------------ No results for input(s): VITAMINB12, FOLATE, FERRITIN, TIBC, IRON, RETICCTPCT in the last 72 hours.  Coagulation profile  Recent Labs Lab 01/30/15 2305  INR 1.21    No results for input(s): DDIMER in the last 72 hours.  Cardiac Enzymes  Recent Labs Lab 01/31/15 0540 01/31/15 1150 01/31/15 1730  TROPONINI 0.13* 0.29* 0.33*   ------------------------------------------------------------------------------------------------------------------ Invalid input(s): POCBNP   Time Spent in minutes   35   SINGH,PRASHANT K M.D on 02/03/2015 at 9:39 AM  Between 7am to 7pm - Pager - [phone]  After 7pm go to [email or web address] - password [facility]  [facility]   Office  [phone]

## 2015-02-03 NOTE — Progress Notes (Signed)
ANTICOAGULATION CONSULT NOTE - [place] for Xarelto Indication: pulmonary embolus  Allergies  Allergen Reactions  . Olanzapine Other (See Comments)    Agitation, pts family does not recall   Patient Measurements: Height: 5' (152.4 cm) Weight: 159 lb 9.8 oz (72.4 kg) IBW/kg (Calculated) : 45.5 Heparin Dosing Weight: 56 kg  Vital Signs: Temp: 98.2 F (36.8 C) (06/05 0613) Temp Source: Oral (06/05 0613) BP: 90/53 mmHg (06/05 0613) Pulse Rate: 96 (06/05 0613)  Labs:  Recent Labs  01/31/15 1150 01/31/15 1730 02/01/15 0435 02/01/15 0630  HGB  --   --  9.8*  --   HCT  --   --  34.7*  --   PLT  --   --  280  --   HEPARINUNFRC 1.10*  --   --  2.10*  TROPONINI 0.29* 0.33*  --   --    Estimated Creatinine Clearance: 56.5 mL/min (by C-G formula based on Cr of 0.59).  Medical History: Past Medical History  Diagnosis Date  . Dyspnea   . Pulmonary embolism   . Breast cyst     right  . Hypothyroidism   . Renal cyst   . Leukocytopenia   . Monoclonal gammopathy   . Osteoarthritis   . Schizoaffective disorder   . Wears dentures   . Pulmonary hypertension   . Dementia    Assessment: 32 yoF with PMH of chronic PE on Apixaban, pulmonary HTN, schizoaffective disorder, chronic anemia who presented to the ED with increasing SOB and chest pain. CTa of chest shows worsening thromboembolism. Of note, per daughter, patient had not taken a dose of Apixaban since sometime last weekend PTA. Question of noncompliance vs. treatment failure with Apixaban. Pharmacy consulted to dose IV heparin for + PE, now to transition to Xarelto.  6/2 to Xarelto  Goal of Therapy:  Heparin level 0.3-0.7 units/ml Monitor platelets by anticoagulation protocol: Yes   Plan:   No change Xarelto begun 6/2 pm at 15 mg PO BID with meals x 21 days, then 20 mg PO daily with food (6/24)  Education provided 6/4 to daughter  Minda Ditto PharmD Pager [phone] 02/03/2015, 10:47  AM

## 2015-02-03 NOTE — Consult Note (Signed)
Consultation Note Date: 02/03/2015   Patient Name: Michaela Horton  DOB: 06-09-1941  MRN: [id number]  Age / Sex: 74 y.o., female   PCP: Leighton Ruff, MD Referring Physician: Thurnell Lose, MD  Reason for Consultation: Establishing goals of care and Non pain symptom management  Palliative Care Assessment and Plan Summary of Established Goals of Care and Medical Treatment Preferences    Palliative Care Discussion Held Today Contacts/Participants in Discussion: Primary Decision Maker: 3 children present for meeting. Not clear if pt has a designated HCPOA. Son present is from [place] and is an ED physician. Family appears to defer to him in terms of decision making  Met with pt and family for meeting regarding goals of care but pt was highly agitated which was additionally a focus of meeting.   Pt is a 74 yo female with a PMH of severe pulmonary hypertension, CHF, chronic thromboembolism, recurrent PE's  hypothyroidism  And schizoaffective disorder . During meeting pt was hyper verbal with very pressured speech to the point it was almost garbled. When you told her to speak clearly she was able to for brief sentences. Per staff on unit pt has been agitated like this almost since admission and when no one is in the room she is almost yelling. She is disorganized, delusional with paranoid ideation. She is hostile towards her family and the discussion often upsets her but family wanted to meet in the room, stating "she understands everything". Per family her combined worsening pulmonary hypertension as well as unmanaged schizoaffective disorder has precipitated multiple in-patient hospitalization for both medical purposes as well as psychiatric tx. Pt per son "has been like this" since her lithium was stopped in 2014 but not to the degree we are seeing in the hospital. She becomes very agitated with conversation but family feels "she understands everything" which at this point her cognitive status is  likely changing as her cardiopulmonary status worsens. Did attempt to address some symptom management related to her fulminant psychiatric presentation, but it appears as though she has been tried on the majority of mood stabilizers as well as antipsychotic medicine without help. She also was psychiatrically hospitalized at [facility] for 2 months then [facility] another 2 months before she had a medical crisis and was transferred to [facility]. She lives at home with paid caregivers ATC and family is ready  Code Status/Advance Care Planning:  DNR  Symptom Management:   Schizoaffective D/O : Pt has not been sleeping which is not a cure-all but could be making symptoms worse. Son agreed to allow me to increase trazdone and schedule it qhs to 50 mg. Agreed to klonopin change for higher dose in am to .25 q am and continue klonopin .5 qhs. Cont Risperdal consta 50 mg IM q 2 weeks. This is maximum daily dosage  Dyspnea: Recommended low dose opioid for relief of dyspnea which could precipitate chest pain.. Son agreed to morphine concentrate 2.5 mg q8 prn  Psycho-social/Spiritual:   Support System: children as well as paid caregivers  Desire for further Chaplaincy support:no  Prognosis: < 6 months  Discharge Planning:  Home with Hospice       Chief Complaint/History of Present Illness: Pt is a 74yo female admitted. with c/o chest pain. She has severe pulmonary hypertension, CHF, chronic PE's   Primary Diagnoses  Present on Admission:  . Hypoxia . Chronic thromboembolic pulmonary hypertension . Hypothyroidism . Schizoaffective disorder, unspecified type . Acute respiratory failure  with hypoxia . Chronic anemia  Palliative Review of Systems: Too confused I have reviewed the medical record, interviewed the patient and family, and examined the patient. The following aspects are pertinent.  Past Medical History  Diagnosis Date  . Dyspnea    . Pulmonary embolism   . Breast cyst     right  . Hypothyroidism   . Renal cyst   . Leukocytopenia   . Monoclonal gammopathy   . Osteoarthritis   . Schizoaffective disorder   . Wears dentures   . Pulmonary hypertension   . Dementia    History   Social History  . Marital Status: Divorced    Spouse Name: N/A  . Number of Children: N/A  . Years of Education: N/A   Occupational History  . Retired     worked at a nursery   Social History Main Topics  . Smoking status: Never Smoker   . Smokeless tobacco: Never Used  . Alcohol Use: No  . Drug Use: No  . Sexual Activity: No   Other Topics Concern  . None   Social History Narrative   Family History  Problem Relation Age of Onset  . Cancer Father     PROSTATE  . Atrial fibrillation Mother   . Alzheimer's disease Mother    Scheduled Meds: . antiseptic oral rinse  7 mL Mouth Rinse q12n4p  . chlorhexidine  15 mL Mouth Rinse BID  . [START ON 02/04/2015] clonazePAM  0.25 mg Oral q morning - 10a  . clonazePAM  0.5 mg Oral QHS  . furosemide  20 mg Oral Daily  . levothyroxine  125 mcg Oral QAC breakfast  . Macitentan  10 mg Oral Daily  . pantoprazole  40 mg Oral Daily  . [START ON 02/08/2015] risperiDONE microspheres  50 mg Intramuscular Q14 Days  . rivaroxaban  15 mg Oral BID WC   Followed by  . [START ON 02/22/2015] rivaroxaban  20 mg Oral Q breakfast  . sodium chloride  3 mL Intravenous Q12H  . traZODone  50 mg Oral QHS   Continuous Infusions:  PRN Meds:.acetaminophen **OR** acetaminophen, albuterol, haloperidol lactate, lactose free nutrition, morphine CONCENTRATE, ondansetron **OR** ondansetron (ZOFRAN) IV, polyethylene glycol, risperiDONE, senna-docusate Medications Prior to Admission:  Prior to Admission medications   Medication Sig Start Date End Date Taking? Authorizing Provider  acetaminophen (TYLENOL) 325 MG tablet Take 2 tablets (650 mg total) by mouth every 6 (six) hours as needed for mild pain, fever or  headache. 07/23/14  Yes Christina P Rama, MD  albuterol (PROVENTIL HFA;VENTOLIN HFA) 108 (90 BASE) MCG/ACT inhaler Inhale 1-2 puffs into the lungs every 6 (six) hours as needed for wheezing or shortness of breath. 07/08/14  Yes April Palumbo, MD  albuterol (PROVENTIL) (5 MG/ML) 0.5% nebulizer solution Inhale 2.5 mg into the lungs 4 (four) times daily as needed. Shortness of breath 12/18/14 12/18/15 Yes Historical Provider, MD  apixaban (ELIQUIS) 2.5 MG TABS tablet Take 2.5 mg by mouth every 12 (twelve) hours.   Yes Historical Provider, MD  clonazePAM (KLONOPIN) 0.5 MG tablet Take 0.5 mg by mouth at bedtime.   Yes Historical Provider, MD  clonazePAM (KLONOPIN) 0.5 MG tablet Take 0.25 tablets by mouth 2 (two) times daily. 12/18/14 01/30/15 Yes Historical Provider, MD  [employer] TUSSIN DM COUGH/CHEST 10-200 MG/5ML LIQD Take 5 mLs by mouth every 4 (four) hours as needed. cough 12/18/14  Yes Historical Provider, MD  furosemide (LASIX) 20 MG tablet Take 20 mg by mouth daily.  Yes Historical Provider, MD  lactose free nutrition (BOOST PLUS) LIQD Take 237 mLs by mouth 2 (two) times daily between meals. Patient taking differently: Take 237 mLs by mouth 2 (two) times daily between meals as needed (nutritional supplement).  07/23/14  Yes Christina P Rama, MD  levothyroxine (SYNTHROID, LEVOTHROID) 125 MCG tablet Take 125 mcg by mouth daily before breakfast.   Yes Historical Provider, MD  Macitentan 10 MG TABS Take 1 tablet by mouth daily.   Yes Historical Provider, MD  Melatonin 3 MG TABS Take 1 tablet by mouth at bedtime.   Yes Historical Provider, MD  pantoprazole (PROTONIX) 40 MG tablet Take 40 mg by mouth daily.   Yes Historical Provider, MD  polyethylene glycol (MIRALAX / GLYCOLAX) packet Take 17 g by mouth 2 (two) times daily as needed for severe constipation. constipation 12/18/14  Yes Historical Provider, MD  risperiDONE (RISPERDAL) 0.5 MG tablet Take 0.5 mg by mouth every 6 (six) hours as needed. agitation 12/18/14   Yes Historical Provider, MD  risperiDONE microspheres (RISPERDAL CONSTA) 50 MG injection Inject 2 mLs into the muscle every 14 (fourteen) days. Got on 12/28/14 12/18/14 12/18/15 Yes Historical Provider, MD  sennosides-docusate sodium (SENOKOT-S) 8.6-50 MG tablet Take 2 tablets by mouth 2 (two) times daily as needed for constipation.    Yes Historical Provider, MD  traZODone (DESYREL) 50 MG tablet Take 25 mg by mouth at bedtime as needed for sleep.   Yes Historical Provider, MD  divalproex (DEPAKOTE ER) 500 MG 24 hr tablet Take 1 tablet (500 mg total) by mouth 2 (two) times daily. Patient not taking: Reported on 12/31/2014 07/23/14   Venetia Maxon Rama, MD  enoxaparin (LOVENOX) 100 MG/ML injection Inject 0.9 mLs (90 mg total) into the skin daily. Patient not taking: Reported on 12/31/2014 07/23/14   Venetia Maxon Rama, MD  OLANZapine zydis (ZYPREXA) 5 MG disintegrating tablet Take 1 tablet (5 mg total) by mouth at bedtime. Patient not taking: Reported on 12/31/2014 07/23/14   Venetia Maxon Rama, MD  potassium chloride (K-DUR) 10 MEQ tablet Take 1 tablet (10 mEq total) by mouth daily. Patient not taking: Reported on 01/30/2015 01/01/15   Jola Schmidt, MD   Allergies  Allergen Reactions  . Olanzapine Other (See Comments)    Agitation, pts family does not recall   CBC:    Component Value Date/Time   WBC 4.2 02/01/2015 0435   WBC 4.2 07/17/2014 1011   HGB 9.8* 02/01/2015 0435   HGB 14.6 07/17/2014 1011   HCT 34.7* 02/01/2015 0435   HCT 45.3 07/17/2014 1011   PLT 280 02/01/2015 0435   PLT 279 07/17/2014 1011   MCV 84.6 02/01/2015 0435   MCV 95.6 07/17/2014 1011   NEUTROABS 1.8 02/01/2015 0435   NEUTROABS 2.1 07/17/2014 1011   LYMPHSABS 1.8 02/01/2015 0435   LYMPHSABS 1.7 07/17/2014 1011   MONOABS 0.5 02/01/2015 0435   MONOABS 0.3 07/17/2014 1011   EOSABS 0.1 02/01/2015 0435   EOSABS 0.1 07/17/2014 1011   BASOSABS 0.0 02/01/2015 0435   BASOSABS 0.0 07/17/2014 1011   Comprehensive Metabolic Panel:      Component Value Date/Time   NA 136 01/31/2015 0540   NA 140 07/17/2014 1011   NA 139 07/31/2013 1446   K 3.7 01/31/2015 0540   K 4.0 07/17/2014 1011   CL 95* 01/31/2015 0540   CL 105 01/09/2013 1015   CO2 32 01/31/2015 0540   CO2 24 07/17/2014 1011   BUN 19 01/31/2015 0540   BUN  20.4 07/17/2014 1011   BUN 16 07/31/2013 1446   CREATININE 0.59 01/31/2015 0540   CREATININE 1.2* 07/17/2014 1011   GLUCOSE 94 01/31/2015 0540   GLUCOSE 94 07/17/2014 1011   GLUCOSE 75 07/31/2013 1446   GLUCOSE 91 01/09/2013 1015   CALCIUM 8.6* 01/31/2015 0540   CALCIUM 9.4 07/17/2014 1011   AST 24 01/31/2015 0540   AST 28 07/17/2014 1011   ALT 10* 01/31/2015 0540   ALT 30 07/17/2014 1011   ALKPHOS 45 01/31/2015 0540   ALKPHOS 43 07/17/2014 1011   BILITOT 0.4 01/31/2015 0540   BILITOT 0.76 07/17/2014 1011   PROT 6.8 01/31/2015 0540   PROT 8.1 07/17/2014 1011   PROT 7.2 07/31/2013 1446   ALBUMIN 2.6* 01/31/2015 0540   ALBUMIN 3.7 07/17/2014 1011    Physical Exam: Vital Signs: BP 112/54 mmHg  Pulse 77  Temp(Src) 97.5 F (36.4 C) (Oral)  Resp 22  Ht 5' (1.524 m)  Wt 72.4 kg (159 lb 9.8 oz)  BMI 31.17 kg/m2  SpO2 92% SpO2: SpO2: 92 % O2 Device: O2 Device: Nasal Cannula O2 Flow Rate: O2 Flow Rate (L/min): 15 L/min Intake/output summary:  Intake/Output Summary (Last 24 hours) at 02/03/15 1657 Last data filed at 02/03/15 1050  Gross per 24 hour  Intake    480 ml  Output   1250 ml  Net   -770 ml   LBM: Last BM Date: 02/02/15 Baseline Weight: Weight: 58.968 kg (130 lb) Most recent weight: Weight: 72.4 kg (159 lb 9.8 oz)  Exam Findings:  General: Cachetic elderly female . Very agitated  Resp: Increased work of breathing with agitation at rest Psych: Speech pressured, garbled. Thought processes disorganized and repetitive         Palliative Performance Scale: 30-40%              Additional Data Reviewed: Recent Labs     02/01/15  0435  WBC  4.2  HGB  9.8*  PLT  280     Time  In: 1500 Time Out: 1630 Time Total: 90 min  Greater than 50%  of this time was spent counseling and coordinating care related to the above assessment and plan. Updated Dr. Candiss Norse as to discharge plan  Signed by: Dory Horn, NP  Dory Horn, NP  02/03/2015, 4:57 PM  Please contact Palliative Medicine Team phone at [phone] for questions and concerns.

## 2015-02-04 MED ORDER — OLANZAPINE 10 MG PO TBDP: 10.0000 mg | ORAL_TABLET | Freq: Every day | ORAL | Status: AC

## 2015-02-04 MED ORDER — MORPHINE SULFATE (CONCENTRATE) 10 MG /0.5 ML PO SOLN: 10.0000 mg | ORAL | Status: AC | PRN

## 2015-02-04 MED ORDER — RIVAROXABAN 20 MG PO TABS: 20.0000 mg | ORAL_TABLET | Freq: Every day | ORAL | Status: AC

## 2015-02-04 MED ORDER — TRAZODONE HCL 50 MG PO TABS: 50.0000 mg | ORAL_TABLET | Freq: Every evening | ORAL | Status: AC | PRN

## 2015-02-04 MED ORDER — RIVAROXABAN 15 MG PO TABS: 15.0000 mg | ORAL_TABLET | Freq: Two times a day (BID) | ORAL | Status: AC

## 2015-02-04 MED ORDER — RISPERIDONE 1 MG PO TABS: 1.0000 mg | ORAL_TABLET | Freq: Four times a day (QID) | ORAL | Status: AC | PRN

## 2015-02-04 NOTE — Progress Notes (Signed)
Pt arrived at 0300 from previous unit.  Assessment performed.  Pt sleeping deeply from HS meds.  Unable to assess mental state.  Placed pt on 6 liters nasal canula since it was reported that she could not tolerate mask.  sats in the low to mid 80's.  It was reported that she runs that and lower at home.  Will continue to assess and encourage pt to wear mask.

## 2015-02-04 NOTE — Progress Notes (Signed)
ANTICOAGULATION CONSULT NOTE - [facility] for Xarelto Indication: pulmonary embolus  Allergies  Allergen Reactions  . Olanzapine Other (See Comments)    Agitation, pts family does not recall   Patient Measurements: Height: 5' (152.4 cm) Weight: 159 lb 9.8 oz (72.4 kg) IBW/kg (Calculated) : 45.5   Vital Signs: Temp: 99.6 F (37.6 C) (06/06 0241) Temp Source: Oral (06/06 0241) BP: 94/56 mmHg (06/06 0241) Pulse Rate: 91 (06/06 0241)  Labs: No results for input(s): HGB, HCT, PLT, APTT, LABPROT, INR, HEPARINUNFRC, CREATININE, CKTOTAL, CKMB, TROPONINI in the last 72 hours. Estimated Creatinine Clearance: 56.5 mL/min (by C-G formula based on Cr of 0.59).  Medical History: Past Medical History  Diagnosis Date  . Dyspnea   . Pulmonary embolism   . Breast cyst     right  . Hypothyroidism   . Renal cyst   . Leukocytopenia   . Monoclonal gammopathy   . Osteoarthritis   . Schizoaffective disorder   . Wears dentures   . Pulmonary hypertension   . Dementia    Assessment: 51 yoF with PMH of chronic PE on Apixaban, pulmonary HTN, schizoaffective disorder, chronic anemia who presented to the ED with increasing SOB and chest pain. CTa of chest shows worsening thromboembolism. Of note, per daughter, patient had not taken a dose of Apixaban since sometime last weekend PTA. Question of noncompliance vs. treatment failure with Apixaban. Pharmacy consulted to dose IV heparin for + PE, now to transition to Xarelto.  6/2 converted to Xarelto  Goal of Therapy:  Full-dose anticoagulation with Xarelto   Plan:   Continue present Xarelto (begun 6/2 PM: 15 mg PO BID with meals x 21 days, then 20 mg PO daily with food [ starting 6/24]  Education materials were provided 6/4 to daughter  Follow clinical course.  Clayburn Pert, PharmD, BCPS Pager: [phone] 02/04/2015  7:04 AM

## 2015-02-04 NOTE — Progress Notes (Signed)
Per [facility],  Pt transferred to room 1621, daughter Karma Greaser notified, report given to Fara Olden RN.

## 2015-02-04 NOTE — Progress Notes (Signed)
Spoke with both daughters [name] and [name] who are in agreeance with family member to be discharged back to home. Daughter [name] will be at residence where pt is being taken to by [facility]. [facility] has been contacted to transport pt back to home with daughter. No further questions were asked by family.

## 2015-02-04 NOTE — Care Management Note (Signed)
Case Management Note  Patient Details  Name: Michaela Horton MRN: [id number] Date of Birth: 03/16/1941  Subjective/Objective:             Admitted with acute respiratory failure       Action/Plan: Discharge planning. Spoke with Luellen Pucker at [facility]. They are ready to receive the patient and process the admission to hospice today. They will f/u with the daughter. It appears that all DME are in place. Orders and d/c summary faxed to [facility]. Attempted to reach daughter, [name], but no answer. Left message for her to call me back.   Expected Discharge Date:   (UNKNOWN)               Expected Discharge Plan:  Home w Hospice Care  In-House Referral:  NA  Discharge planning Services  CM Consult  Post Acute Care Choice:    Choice offered to:  NA  DME Arranged:    DME Agency:   ([place])  [place]:    [place] Agency:  [place]  Status of Service:  Completed, signed off  Medicare Important Message Given:  Yes Date Medicare IM Given:  02/04/15 Medicare IM give by:  Sunday Spillers RN CM  Date Additional Medicare IM Given:    Additional Medicare Important Message give by:     If discussed at [place] of Stay Meetings, dates discussed:    Additional Comments:  Guadalupe Maple, RN 02/04/2015, 10:51 AM

## 2015-02-04 NOTE — Plan of Care (Signed)
Problem: Phase II Progression Outcomes Goal: Obtain order to discontinue catheter if appropriate Outcome: Not Applicable Date Met:  40/00/50 Pt to maintain foley catheter.

## 2015-02-04 NOTE — Discharge Summary (Addendum)
Michaela Horton, is a 74 y.o. female  DOB Mar 26, 1941  MRN [id number].  Admission date:  01/30/2015  Admitting Physician  Rise Patience, MD  Discharge Date:  02/04/2015   Primary MD  Gerrit Heck, MD  Recommendations for primary care physician for things to follow:   Being discharged with home hospice goal of care now is comfort.   Admission Diagnosis  Hypoxia [R09.02] Pulmonary hypertension [I27.0] CHF exacerbation [I50.9] Pulmonary emboli [I26.99]   Discharge Diagnosis  Hypoxia [R09.02] Pulmonary hypertension [I27.0] CHF exacerbation [I50.9] Pulmonary emboli [I26.99]    Principal Problem:   Acute respiratory failure with hypoxia Active Problems:   Hypothyroidism   Schizoaffective disorder, unspecified type   Chronic thromboembolic pulmonary hypertension   Hypoxia   Chronic anemia   Palliative care encounter      Past Medical History  Diagnosis Date  . Dyspnea   . Pulmonary embolism   . Breast cyst     right  . Hypothyroidism   . Renal cyst   . Leukocytopenia   . Monoclonal gammopathy   . Osteoarthritis   . Schizoaffective disorder   . Wears dentures   . Pulmonary hypertension   . Dementia     Past Surgical History  Procedure Laterality Date  . Tubal ligation  1970  . Tubal ligation    . Breast surgery      cyst       History of present illness and  Hospital Course:     Kindly see H&P for history of present illness and admission details, please review complete Labs, Consult reports and Test reports for all details in brief  HPI  from the history and physical done on the day of admission  Michaela Horton is a 74 y.o. female with history of chronic thromboembolism on Apixaban, pulmonary hypertension, schizoaffective disorder, hypothyroidism, chronic anemia was  brought to the ER after patient started complaining of increasing shortness of breath and chest pain. Patient's symptoms started having last night around 8 PM. Patient lives at home alone with home health aides. As per the daughter and son patient was recently in [facility] for syncope 6 weeks ago and was in the hospital for 2 weeks. At that time patient was placed on 2 medication for pulmonary hypertension following which 1 was discontinued as patient was not ambulatory. In the ER CT angiogram of the chest shows worsening thromboembolism with pulmonary hypertension. Patient was on Apixaban and at this time ER physician and start patient on heparin infusion and was given Lasix 40 mg IV 1 dose. Patient had required to be on BiPAP to maintain saturation. As per patient's daughter patient is sent home on 100% oxygen. Patient is usually bed bound with difficulty moving lower extremities. Patient otherwise denies any nausea vomiting abdominal pain diarrhea fever chills or productive cough. Cardiac markers were negative. Patient has been placed on BiPAP to maintain sats.  Hospital Course   1. Acute on Chr. Resp Failure - due to Chronic recurrent PEs with severe Pulm HTN  now appears end-stage, Lower Ext Venous US -ve, failed PO Eliquis at home with CT angiogram showing new clot burden in the lungs, had recently been to [facility] and has multiple recent admissions with poor quality of life and largely bedbound.   She was discharged with hospice from [facility] few days prior to this admission. Here initially she was on heparin drip and required BiPAP, now transitioned to xaralto under the guidance of pulmonary critical care, oxygen for comfort and symptom control. Long-term prognosis poor.   Talked to patient's son who is a physician along with her daughter - patient will be DO NOT RESUSCITATE gentle medical treatment goal directed towards comfort, palliative care was also consulted and now she is being  discharged home with hospice with goal of care being comfort, family is open for gentle medical treatment which will be continued.    2. Hypothyroidism. Continue home dose Synthroid.    3. Chest pain secondary to RV strain from PEs in the ER upon admission. Troponin mildly elevated not in ACS pattern, EKG nonacute, now chest pain-free, discussed with son, does not desire echogram etc. Management will not change. She is not a candidate for cardiac catheterization or stent placement. Continue anticoagulation, treat underlying problem as in #1 above as best as we can. Blood pressure too low to tolerate beta blocker.    4. Anemia of chronic disease. Stable no acute issues.    5. Schizoaffective disorder with bipolar disorder. Has intermittent episodes of screaming and crying, she also gets much agitated on intermittent basis. Family was not interested in a psychiatry input. They wanted home medications adjusted which have been done. Again: Goal of Care is supportive directed towards comfort.    6. Chronic weakness and deconditioning. Baseline bedbound. Supportive care only.   7. Chronic indwelling Foley. Continue    8.Chr Diastolic CHF Ef 56% - compensated.    Discharge Condition: Guarded, being discharged with home hospice with goal of care comfort  Follow UP  Follow-up Information    Follow up with Gerrit Heck, MD. Schedule an appointment as soon as possible for a visit in 1 week.   Specialty:  Family Medicine   Contact information:   [place] [address] [phone]        Consults obtained - Pulmonary, [place] and Activity recommendation: See Discharge Instructions below  Discharge Instructions       Discharge Instructions    Discharge instructions    Complete by:  As directed   Information on my medicine - XARELTO (rivaroxaban)  This medication education was reviewed with me or my healthcare representative as part of  my discharge preparation.  The pharmacist that spoke with me during my hospital stay was:  Minda Ditto, [place]? Xarelto was prescribed to treat blood clots that may have been found in the veins of your legs (deep vein thrombosis) or in your lungs (pulmonary embolism) and to reduce the risk of them occurring again.  What do you need to know about Xarelto? The starting dose is one 15 mg tablet taken TWICE daily with food for the FIRST 21 DAYS then on (enter date)  02/22/15  the dose is changed to one 20 mg tablet taken ONCE A DAY with your evening meal.  DO NOT stop taking Xarelto without talking to the health care provider who prescribed the medication.  Refill your prescription for 20 mg tablets before you run out.  After discharge, you should have regular check-up appointments with your healthcare provider that is prescribing your Xarelto.  In the future your dose may need to be changed if your kidney function changes by a significant amount.  What do you do if you miss a dose? If you are taking Xarelto TWICE DAILY and you miss a dose, take it as soon as you remember. You may take two 15 mg tablets (total 30 mg) at the same time then resume your regularly scheduled 15 mg twice daily the next day.  If you are taking Xarelto ONCE DAILY and you miss a dose, take it as soon as you remember on the same day then continue your regularly scheduled once daily regimen the next day. Do not take two doses of Xarelto at the same time.   Important Safety Information Xarelto is a blood thinner medicine that can cause bleeding. You should call your healthcare provider right away if you experience any of the following: Bleeding from an injury or your nose that does not stop. Unusual colored urine (red or dark brown) or unusual colored stools (red or black). Unusual bruising for unknown reasons. A serious fall or if you hit your head (even if there is no  bleeding).  Some medicines may interact with Xarelto and might increase your risk of bleeding while on Xarelto. To help avoid this, consult your healthcare provider or pharmacist prior to using any new prescription or non-prescription medications, including herbals, vitamins, non-steroidal anti-inflammatory drugs (NSAIDs) and supplements.  This medication works the same way Eliquis did, but the advantage is once daily dosing.  This website has more information on Xarelto: [email or web address]    Follow with Primary MD Gerrit Heck, MD in 7 days if desired   Activity: As tolerated with Full fall precautions use walker/cane & assistance as needed   Disposition Home with Hospice   Diet: Heart Healthy   with feeding assistance and aspiration precautions.  For Heart failure patients - Check your Weight same time everyday, if you gain over 2 pounds, or you develop in leg swelling, experience more shortness of breath or chest pain, call your Primary MD immediately. Follow Cardiac Low Salt Diet and 1.5 lit/day fluid restriction.   On your next visit with your primary care physician please Get Medicines reviewed and adjusted.   Please request your Prim.MD to go over all Hospital Tests and Procedure/Radiological results at the follow up, please get all Hospital records sent to your Prim MD by signing hospital release before you go home.   If you experience worsening of your admission symptoms, develop shortness of breath, life threatening emergency, suicidal or homicidal thoughts you must seek medical attention immediately by calling 911 or calling your MD immediately  if symptoms less severe.  You Must read complete instructions/literature along with all the possible adverse reactions/side effects for all the Medicines you take and that have been prescribed to you. Take any new Medicines after you have completely understood and accpet all the possible adverse reactions/side effects.    Do not drive, operating heavy machinery, perform activities at heights, swimming or participation in water activities or provide baby sitting services if your were admitted for syncope or siezures until you have seen by Primary MD or a Neurologist and advised to do so again.  Do not drive when taking Pain medications.    Do not take more than prescribed Pain, Sleep and Anxiety Medications  Special Instructions: If you have smoked or chewed Tobacco  in  the last 2 yrs please stop smoking, stop any regular Alcohol  and or any Recreational drug use.  Wear Seat belts while driving.   Please note  You were cared for by a hospitalist during your hospital stay. If you have any questions about your discharge medications or the care you received while you were in the hospital after you are discharged, you can call the unit and asked to speak with the hospitalist on call if the hospitalist that took care of you is not available. Once you are discharged, your primary care physician will handle any further medical issues. Please note that NO REFILLS for any discharge medications will be authorized once you are discharged, as it is imperative that you return to your primary care physician (or establish a relationship with a primary care physician if you do not have one) for your aftercare needs so that they can reassess your need for medications and monitor your lab values.     Increase activity slowly    Complete by:  As directed            Discharge Medications     Medication List    STOP taking these medications        apixaban 2.5 MG Tabs tablet  Commonly known as:  ELIQUIS     enoxaparin 100 MG/ML injection  Commonly known as:  LOVENOX     risperiDONE microspheres 50 MG injection  Commonly known as:  RISPERDAL CONSTA      TAKE these medications        acetaminophen 325 MG tablet  Commonly known as:  TYLENOL  Take 2 tablets (650 mg total) by mouth every 6 (six) hours as needed  for mild pain, fever or headache.     albuterol 108 (90 BASE) MCG/ACT inhaler  Commonly known as:  PROVENTIL HFA;VENTOLIN HFA  Inhale 1-2 puffs into the lungs every 6 (six) hours as needed for wheezing or shortness of breath.     albuterol (5 MG/ML) 0.5% nebulizer solution  Commonly known as:  PROVENTIL  Inhale 2.5 mg into the lungs 4 (four) times daily as needed. Shortness of breath     clonazePAM 0.5 MG tablet  Commonly known as:  KLONOPIN  Take 0.5 mg by mouth at bedtime.     [employer] TUSSIN DM COUGH/CHEST 10-200 MG/5ML Liqd  Generic drug:  Dextromethorphan-Guaifenesin  Take 5 mLs by mouth every 4 (four) hours as needed. cough     divalproex 500 MG 24 hr tablet  Commonly known as:  DEPAKOTE ER  Take 1 tablet (500 mg total) by mouth 2 (two) times daily.     furosemide 20 MG tablet  Commonly known as:  LASIX  Take 20 mg by mouth daily.     lactose free nutrition Liqd  Take 237 mLs by mouth 2 (two) times daily between meals.     levothyroxine 125 MCG tablet  Commonly known as:  SYNTHROID, LEVOTHROID  Take 125 mcg by mouth daily before breakfast.     Macitentan 10 MG Tabs  Take 1 tablet by mouth daily.     Melatonin 3 MG Tabs  Take 1 tablet by mouth at bedtime.     morphine CONCENTRATE 10 mg / 0.5 ml concentrated solution  Take 0.5 mLs (10 mg total) by mouth every 4 (four) hours as needed for moderate pain, severe pain or shortness of breath.     OLANZapine zydis 10 MG disintegrating tablet  Commonly known as:  ZYPREXA  Take 1 tablet (10 mg total) by mouth at bedtime.     pantoprazole 40 MG tablet  Commonly known as:  PROTONIX  Take 40 mg by mouth daily.     polyethylene glycol packet  Commonly known as:  MIRALAX / GLYCOLAX  Take 17 g by mouth 2 (two) times daily as needed for severe constipation. constipation     potassium chloride 10 MEQ tablet  Commonly known as:  K-DUR  Take 1 tablet (10 mEq total) by mouth daily.     risperiDONE 1 MG tablet  Commonly known  as:  RISPERDAL  Take 1 tablet (1 mg total) by mouth every 6 (six) hours as needed (agitation). agitation     Rivaroxaban 15 MG Tabs tablet  Commonly known as:  XARELTO  Take 1 tablet (15 mg total) by mouth 2 (two) times daily with a meal. Then switch to 20mg  dose     rivaroxaban 20 MG Tabs tablet  Commonly known as:  XARELTO  Take 1 tablet (20 mg total) by mouth daily with breakfast.  Start taking on:  02/22/2015     sennosides-docusate sodium 8.6-50 MG tablet  Commonly known as:  SENOKOT-S  Take 2 tablets by mouth 2 (two) times daily as needed for constipation.     traZODone 50 MG tablet  Commonly known as:  DESYREL  Take 1 tablet (50 mg total) by mouth at bedtime as needed for sleep.        Major procedures and Radiology Reports - PLEASE review detailed and final reports for all details, in brief -       Ct Angio Chest Pe W/cm &/or Wo Cm  01/31/2015   CLINICAL DATA:  Hypoxia.  Chest pain.  History of pulmonary embolus.  EXAM: CT ANGIOGRAPHY CHEST WITH CONTRAST  TECHNIQUE: Multidetector CT imaging of the chest was performed using the standard protocol during bolus administration of intravenous contrast. Multiplanar CT image reconstructions and MIPs were obtained to evaluate the vascular anatomy.  CONTRAST:  192mL OMNIPAQUE IOHEXOL 350 MG/ML SOLN  COMPARISON:  Radiographs earlier this day, chest CT 07/08/2014  FINDINGS: Worsening thrombotic burden in the right lower lobe posterior basal segment compared to prior exam. There is new nonocclusive thrombus in the medial right middle lobe segmental branch.  The heart remains enlarged. There is right heart strain with dilatation of the right atrium and ventricle with contrast refluxing into the IVC. Thoracic aorta is normal in caliber. Small right infrahilar lymph node, no mediastinal adenopathy. There is no pericardial effusion.  Small bilateral pleural effusions with adjacent compressive atelectasis of both lower lobes. There is whole body  wall edema.  Linear atelectasis or scarring in the right lower lobe and lingula. Tiny subpleural nodularities in the right upper lobe is unchanged.  Cyst in the right lobe of the liver is partially included. There is elevation of left hemidiaphragm that appears unchanged. There are no acute or suspicious osseous abnormalities. Unchanged sclerotic density within T3 vertebral body.  Review of the MIP images confirms the above findings.  IMPRESSION: 1. Worsening thromboembolic burden in the right lung to the lower lobe. New nonocclusive thrombus in the right middle lobe. 2. Elevated right heart pressures with marked dilatation of the right atrium and ventricle and contrast refluxing into the IVC. The degree of right heart dilatation is out of proportion to the thromboembolic burden, and suspect heart failure rather than occlusive thromboembolic disease. There are small bilateral pleural effusions and adjacent compressive atelectasis in the lower lobes.  Critical Value/emergent results were called by telephone at the time of interpretation on 01/31/2015 at 1:11 am to Dr. Pryor Curia , who verbally acknowledged these results.   Electronically Signed   By: Jeb Levering M.D.   On: 01/31/2015 01:14   Dg Chest Portable 1 View  01/31/2015   CLINICAL DATA:  74 year old female with hypoxia.  Chest pain.  EXAM: PORTABLE CHEST - 1 VIEW  COMPARISON:  12/31/2014  FINDINGS: The heart is enlarged. Diminished vascular congestion from prior. Unchanged left pleural effusion, and diminished right pleural effusion. Unchanged left base opacity, likely atelectasis. No pneumothorax. Chronic change about the right shoulder.  IMPRESSION: Improved right base aeration in diminished vascular congestion. Stable cardiomegaly and left pleural effusion.   Electronically Signed   By: Jeb Levering M.D.   On: 01/31/2015 00:19    Micro Results      Recent Results (from the past 240 hour(s))  MRSA PCR Screening     Status: None    Collection Time: 01/31/15  4:39 AM  Result Value Ref Range Status   MRSA by PCR NEGATIVE NEGATIVE Final    Comment:        The GeneXpert MRSA Assay (FDA approved for NASAL specimens only), is one component of a comprehensive MRSA colonization surveillance program. It is not intended to diagnose MRSA infection nor to guide or monitor treatment for MRSA infections.        Today   Subjective:   Nathen May today in bed, tearful, denies any headache chest or abdominal pain  Objective:   Blood pressure 94/56, pulse 91, temperature 99.6 F (37.6 C), temperature source Oral, resp. rate 22, height 5' (1.524 m), weight 72.4 kg (159 lb 9.8 oz), SpO2 83 %.   Intake/Output Summary (Last 24 hours) at 02/04/15 0923 Last data filed at 02/04/15 0600  Gross per 24 hour  Intake    960 ml  Output   1550 ml  Net   -590 ml    Exam Awake but visibly agitated, No new F.N deficits, Normal affect [place].AT,PERRAL Supple Neck,No JVD, No cervical lymphadenopathy appriciated.  Symmetrical Chest wall movement, Good air movement bilaterally, CTAB RRR,No Gallops,Rubs or new Murmurs, No Parasternal Heave +ve B.Sounds, Abd Soft, Non tender, No organomegaly appriciated, No rebound -guarding or rigidity. No Cyanosis, Clubbing or edema, No new Rash or bruise  Data Review   CBC w Diff: Lab Results  Component Value Date   WBC 4.2 02/01/2015   WBC 4.2 07/17/2014   HGB 9.8* 02/01/2015   HGB 14.6 07/17/2014   HCT 34.7* 02/01/2015   HCT 45.3 07/17/2014   PLT 280 02/01/2015   PLT 279 07/17/2014   LYMPHOPCT 42 02/01/2015   LYMPHOPCT 39.9 07/17/2014   MONOPCT 11 02/01/2015   MONOPCT 7.2 07/17/2014   EOSPCT 3 02/01/2015   EOSPCT 1.4 07/17/2014   BASOPCT 1 02/01/2015   BASOPCT 0.5 07/17/2014    CMP: Lab Results  Component Value Date   NA 136 01/31/2015   NA 140 07/17/2014   NA 139 07/31/2013   K 3.7 01/31/2015   K 4.0 07/17/2014   CL 95* 01/31/2015   CL 105 01/09/2013   CO2 32  01/31/2015   CO2 24 07/17/2014   BUN 19 01/31/2015   BUN 20.4 07/17/2014   BUN 16 07/31/2013   CREATININE 0.59 01/31/2015   CREATININE 1.2* 07/17/2014   PROT 6.8 01/31/2015   PROT 8.1 07/17/2014   PROT 7.2 07/31/2013   ALBUMIN 2.6* 01/31/2015  ALBUMIN 3.7 07/17/2014   BILITOT 0.4 01/31/2015   BILITOT 0.76 07/17/2014   ALKPHOS 45 01/31/2015   ALKPHOS 43 07/17/2014   AST 24 01/31/2015   AST 28 07/17/2014   ALT 10* 01/31/2015   ALT 30 07/17/2014  .   Total Time in preparing paper work, data evaluation and todays exam - 35 minutes  Thurnell Lose M.D on 02/04/2015 at 9:23 AM  [facility]   Office  [phone]

## 2015-02-04 NOTE — Progress Notes (Signed)
Called to speak with son who is HCPOA per nurse. He did not want to continue with [facility] and wanted to see list for choice. He was very abrupt during conversation. He states he was told that he would have a presentation from each of the Hospice agencies to assist with choice, he was not happy only receiving a list. He states that he cannot make the decision without input from his sister. Discussed with him the option of d/c home with resumption of services through [facility] and they could explore hospice agencies after d/c. Son at that time left the room without further discussion. Aide present from [facility] during conversation, asked if she could find family to assist with d/c today. Followed up with nurse, contacted [facility] to inform them of change in plans.

## 2015-03-01 IMAGING — CR DG ABDOMEN 1V
2 series · 2 of 2 positions shown · non-contrast
Comparison: No priors.

CLINICAL DATA: Confirm the presence of an IVC filter.

ABDOMEN - 1 VIEW

[t abdomen supine (1 of 2)]
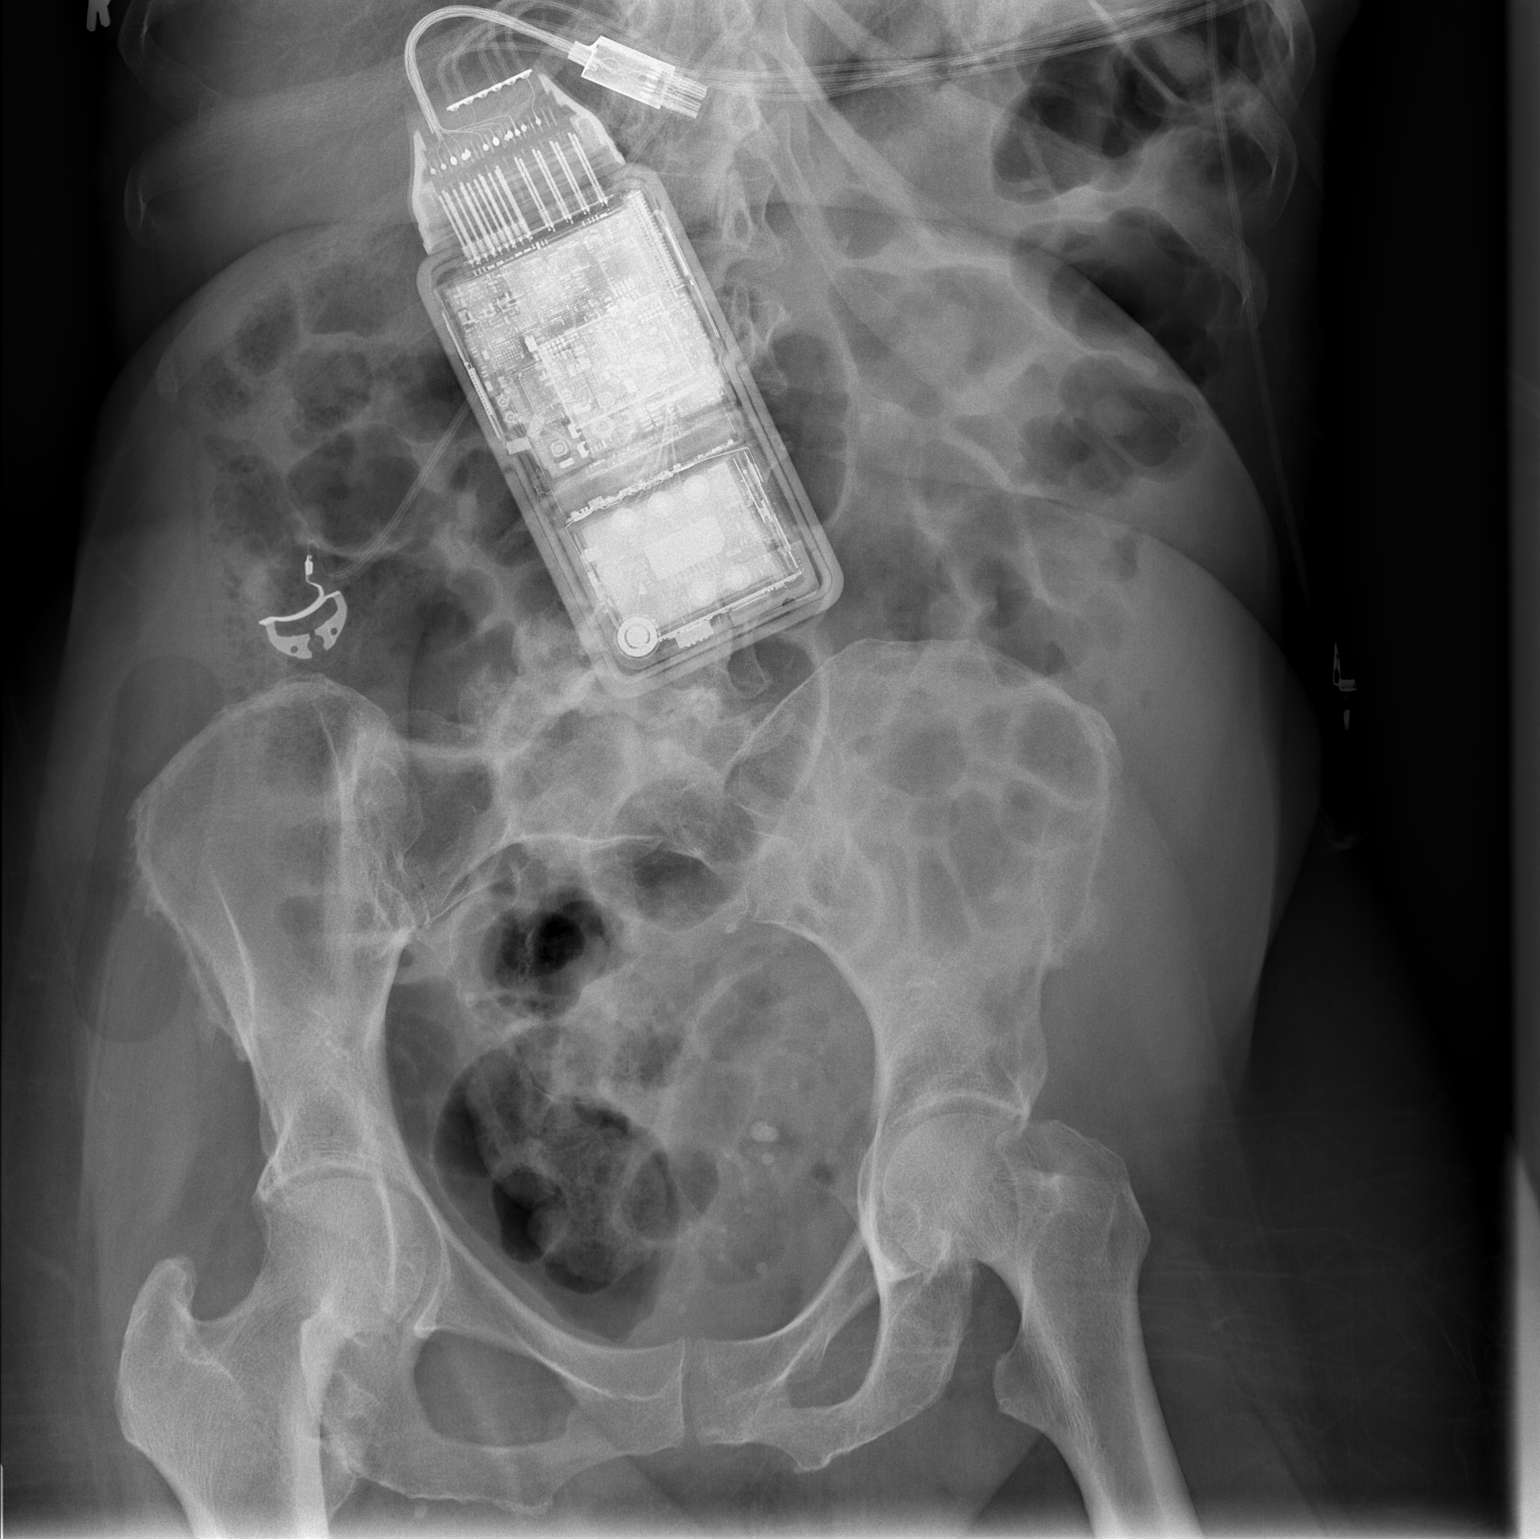

[t abdomen supine (2 of 2)]
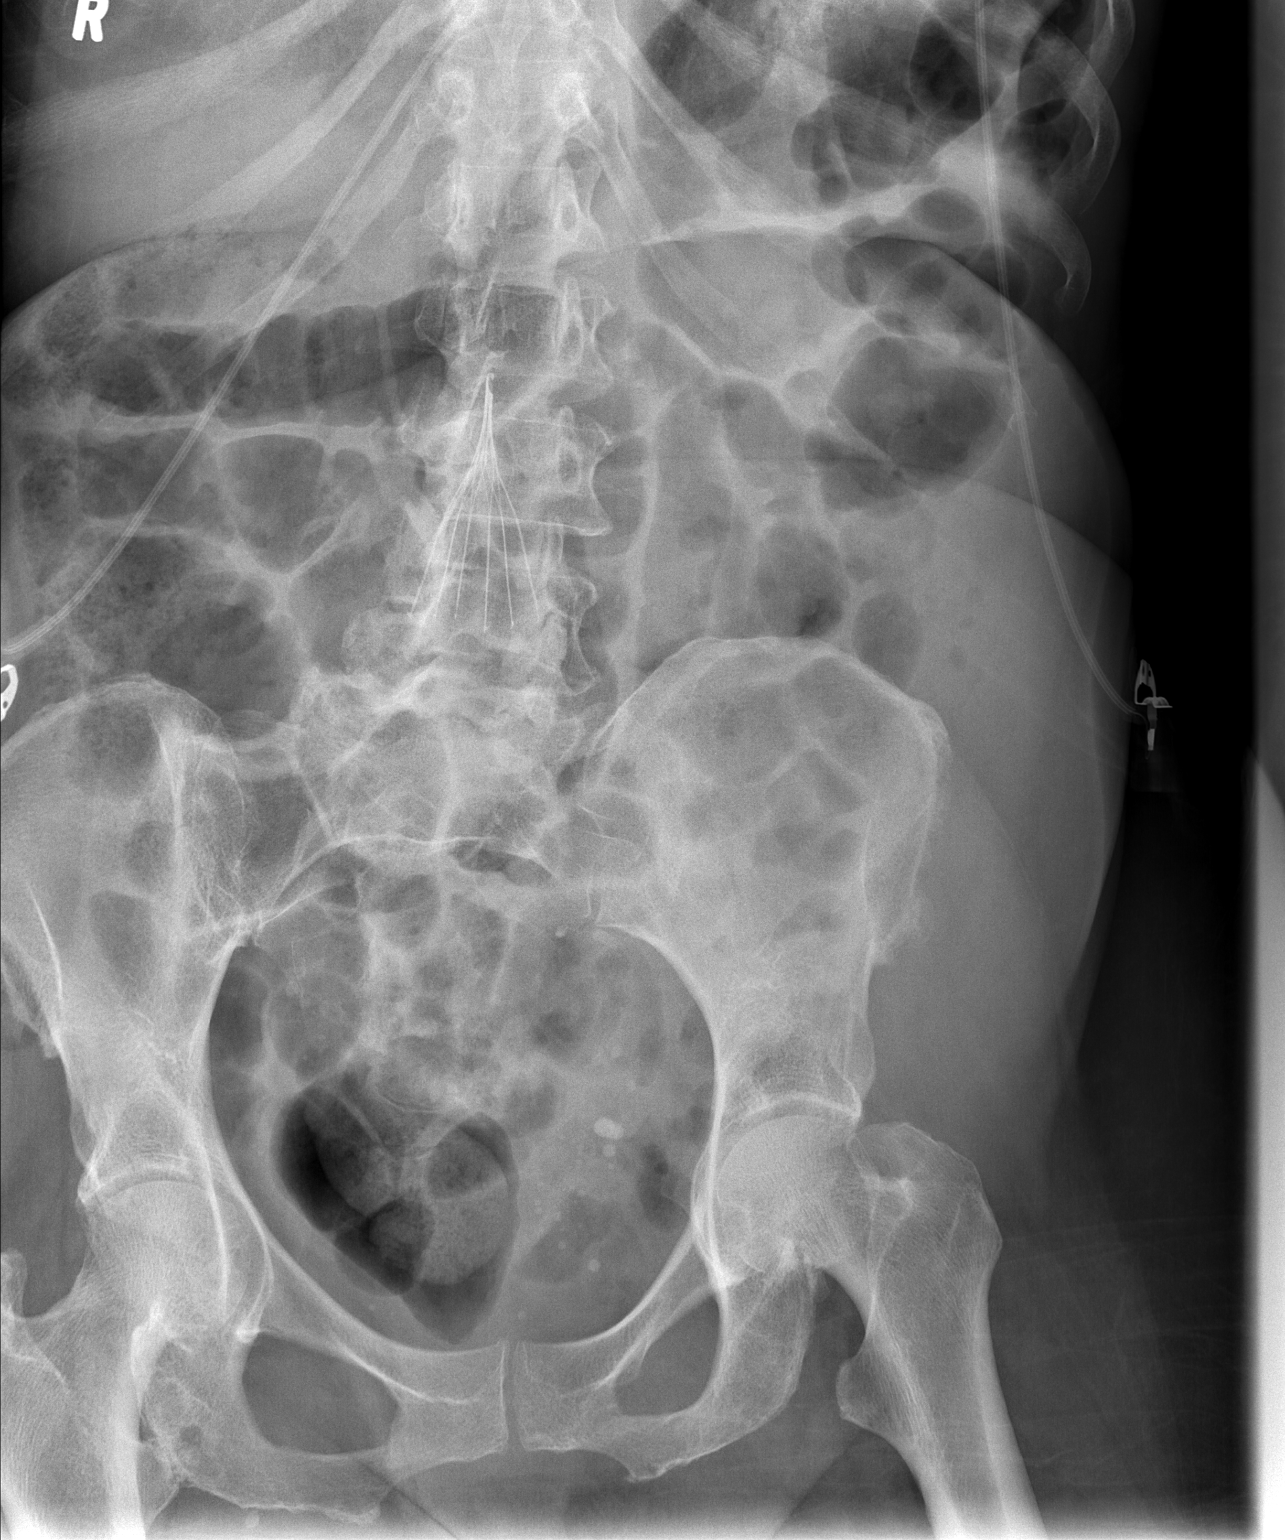

[2 of 2 positions shown; findings below may reference images not displayed]

FINDINGS: IVC filter is noted with tip projecting over the inferior
endplate of L2.  Numerous gas filled loops of small bowel are noted
throughout the central abdomen, without pathologic distension of
small bowel.  Gas and stool is noted throughout the colon including
the distal rectum.  No definite pneumoperitoneum.  Numerous pelvic
phleboliths are incidentally noted.
IMPRESSION: IVC filter in position with tip terminating at the level of the
inferior endplate of L2.

## 2015-03-01 IMAGING — CR DG HIP COMPLETE 2+V*R*
3 series · 3 of 3 positions shown · non-contrast
Comparison: No priors.

CLINICAL DATA: Right-sided hip pain.

RIGHT HIP - COMPLETE 2+ VIEW

[t pelvis a.p.]
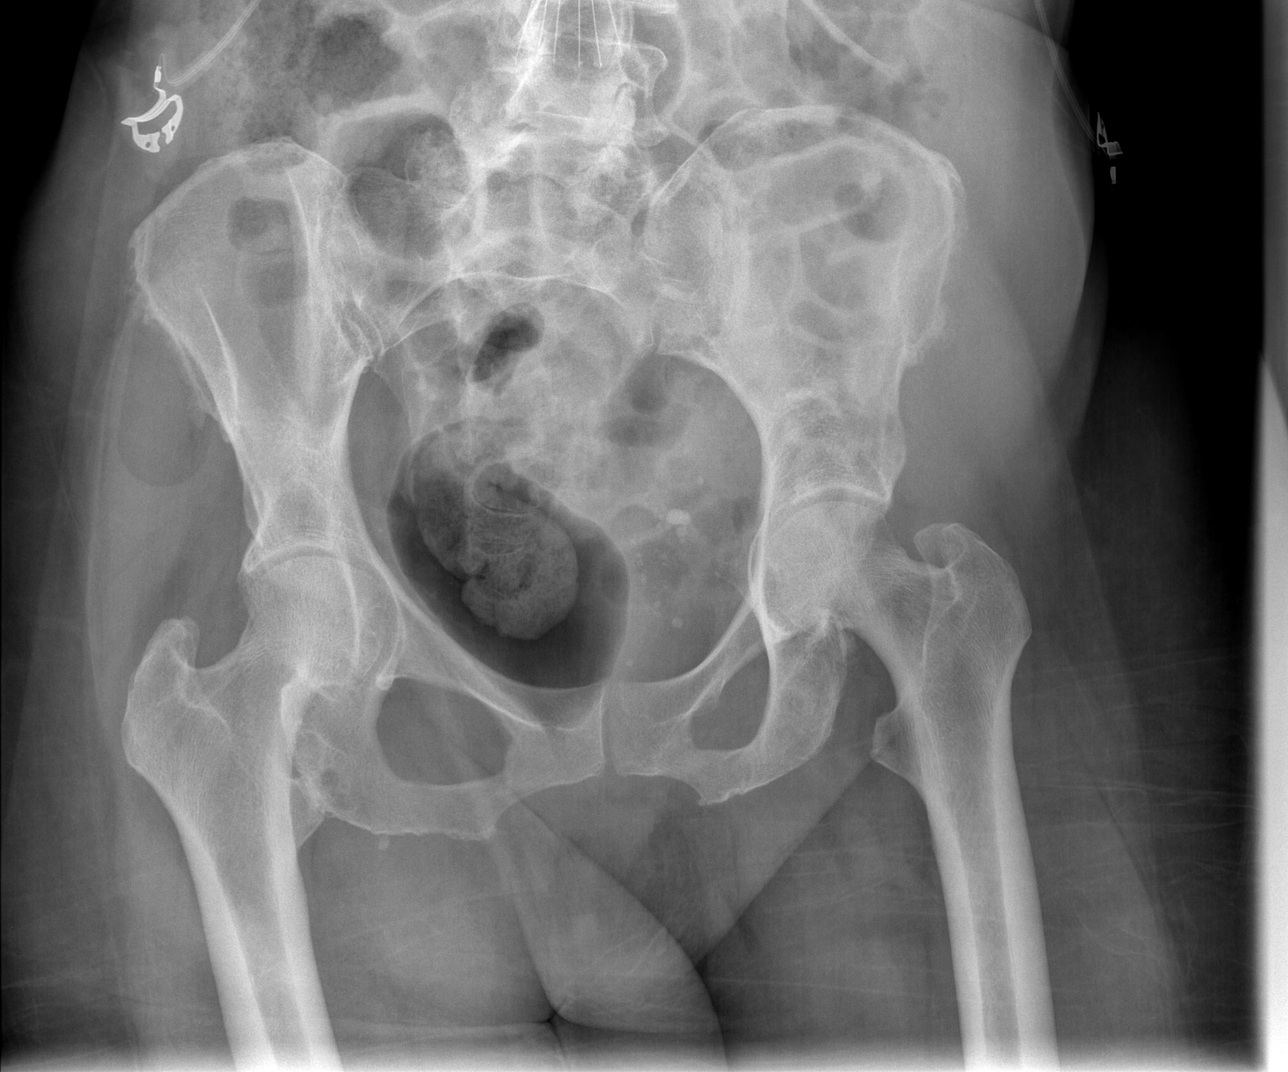

[t hip ap right]
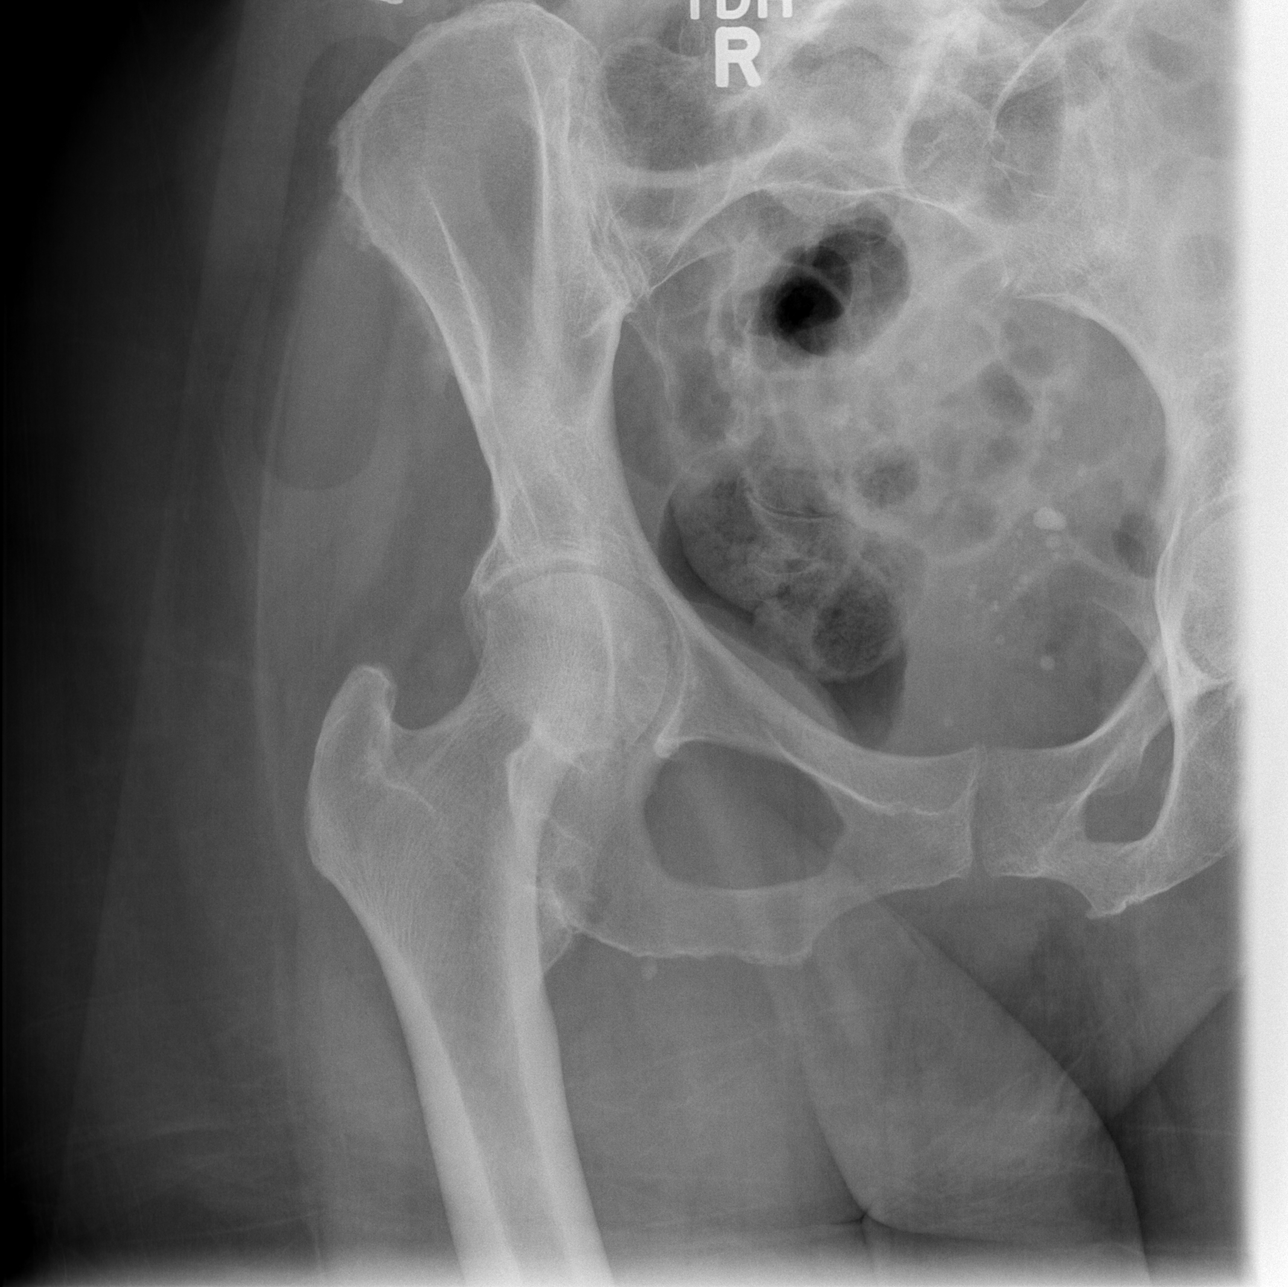

[t hip frog leg right]
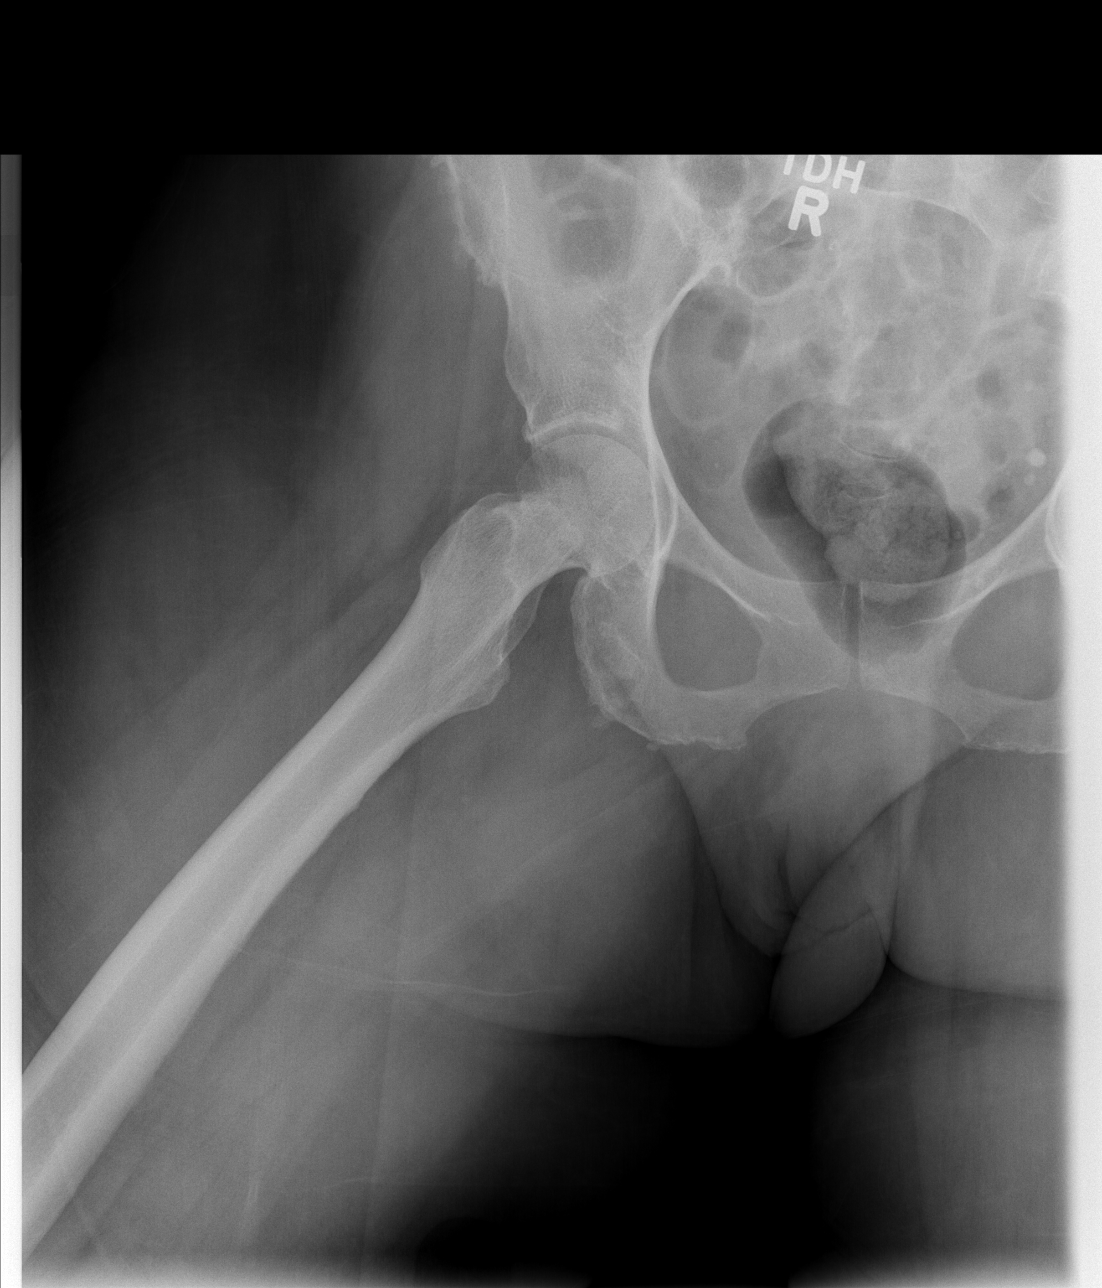

[3 of 3 positions shown; findings below may reference images not displayed]

FINDINGS: AP view of the pelvis and AP and lateral views of the
right hip demonstrate no acute displaced fracture, subluxation or
dislocation.  Mild degenerative changes of osteoarthritis are noted
the hip joints bilaterally.
IMPRESSION: 1.  No acute radiographic abnormality of the bony pelvis or the
right hip.

## 2015-03-01 DEATH — deceased
